# Patient Record
Sex: Male | Born: 1970 | Race: Asian | Hispanic: No | Marital: Single | Smoking: Current every day smoker
Health system: Southern US, Community
[De-identification: ages and names within clinical notes are randomized; demographics above are authoritative.]

## PROBLEM LIST (undated history)

## (undated) DIAGNOSIS — C189 Malignant neoplasm of colon, unspecified: Secondary | ICD-10-CM

## (undated) HISTORY — DX: Malignant neoplasm of colon, unspecified: C18.9

## (undated) HISTORY — PX: COLONOSCOPY: SHX174

---

## 2015-03-11 ENCOUNTER — Encounter (HOSPITAL_COMMUNITY): Payer: Self-pay | Admitting: Nurse Practitioner

## 2015-03-11 ENCOUNTER — Ambulatory Visit (INDEPENDENT_AMBULATORY_CARE_PROVIDER_SITE_OTHER): Payer: Self-pay | Admitting: Family Medicine

## 2015-03-11 ENCOUNTER — Emergency Department (HOSPITAL_COMMUNITY)
Admission: EM | Admit: 2015-03-11 | Discharge: 2015-03-12 | Disposition: A | Discharge status: Home / Self Care | Payer: Self-pay | Attending: Emergency Medicine | Admitting: Emergency Medicine

## 2015-03-11 ENCOUNTER — Emergency Department (HOSPITAL_COMMUNITY): Payer: Self-pay

## 2015-03-11 ENCOUNTER — Ambulatory Visit (INDEPENDENT_AMBULATORY_CARE_PROVIDER_SITE_OTHER): Payer: Self-pay

## 2015-03-11 VITALS — BP 138/90 | HR 74 | Temp 99.5°F | Resp 18 | Ht 66.0 in | Wt 131.0 lb

## 2015-03-11 DIAGNOSIS — R110 Nausea: Secondary | ICD-10-CM | POA: Insufficient documentation

## 2015-03-11 DIAGNOSIS — K529 Noninfective gastroenteritis and colitis, unspecified: Secondary | ICD-10-CM | POA: Insufficient documentation

## 2015-03-11 DIAGNOSIS — R1084 Generalized abdominal pain: Secondary | ICD-10-CM

## 2015-03-11 DIAGNOSIS — D72829 Elevated white blood cell count, unspecified: Secondary | ICD-10-CM

## 2015-03-11 DIAGNOSIS — R1030 Lower abdominal pain, unspecified: Secondary | ICD-10-CM

## 2015-03-11 DIAGNOSIS — Z720 Tobacco use: Secondary | ICD-10-CM | POA: Insufficient documentation

## 2015-03-11 LAB — COMPREHENSIVE METABOLIC PANEL
ALT: 18 U/L (ref 17–63)
ANION GAP: 10 (ref 5–15)
AST: 20 U/L (ref 15–41)
Albumin: 4.6 g/dL (ref 3.5–5.0)
Alkaline Phosphatase: 80 U/L (ref 38–126)
BUN: 8 mg/dL (ref 6–20)
CHLORIDE: 104 mmol/L (ref 101–111)
CO2: 24 mmol/L (ref 22–32)
CREATININE: 0.6 mg/dL — AB (ref 0.61–1.24)
Calcium: 9.6 mg/dL (ref 8.9–10.3)
Glucose, Bld: 112 mg/dL — ABNORMAL HIGH (ref 65–99)
POTASSIUM: 4 mmol/L (ref 3.5–5.1)
SODIUM: 138 mmol/L (ref 135–145)
Total Bilirubin: 0.6 mg/dL (ref 0.3–1.2)
Total Protein: 8.1 g/dL (ref 6.5–8.1)

## 2015-03-11 LAB — CBC
HEMATOCRIT: 47.6 % (ref 39.0–52.0)
HEMOGLOBIN: 16.3 g/dL (ref 13.0–17.0)
MCH: 30.6 pg (ref 26.0–34.0)
MCHC: 34.2 g/dL (ref 30.0–36.0)
MCV: 89.5 fL (ref 78.0–100.0)
Platelets: 253 10*3/uL (ref 150–400)
RBC: 5.32 MIL/uL (ref 4.22–5.81)
RDW: 13.3 % (ref 11.5–15.5)
WBC: 16.7 10*3/uL — AB (ref 4.0–10.5)

## 2015-03-11 LAB — POCT URINALYSIS DIP (MANUAL ENTRY)
Bilirubin, UA: NEGATIVE
Blood, UA: NEGATIVE
Glucose, UA: NEGATIVE
Leukocytes, UA: NEGATIVE
Nitrite, UA: NEGATIVE
Protein Ur, POC: NEGATIVE
Spec Grav, UA: >=1.03
Urobilinogen, UA: 0.2
pH, UA: 5

## 2015-03-11 LAB — LIPASE, BLOOD: LIPASE: 21 U/L — AB (ref 22–51)

## 2015-03-11 LAB — I-STAT CHEM 8, ED
BUN: 7 mg/dL (ref 6–20)
Calcium, Ion: 1.13 mmol/L (ref 1.12–1.23)
Chloride: 104 mmol/L (ref 101–111)
Creatinine, Ser: 0.7 mg/dL (ref 0.61–1.24)
Glucose, Bld: 112 mg/dL — ABNORMAL HIGH (ref 65–99)
HCT: 53 % — ABNORMAL HIGH (ref 39.0–52.0)
HEMOGLOBIN: 18 g/dL — AB (ref 13.0–17.0)
Potassium: 3.8 mmol/L (ref 3.5–5.1)
Sodium: 141 mmol/L (ref 135–145)
TCO2: 23 mmol/L (ref 0–100)

## 2015-03-11 LAB — POC MICROSCOPIC URINALYSIS (UMFC)

## 2015-03-11 LAB — POCT CBC
Granulocyte percent: 85.2 %G — AB (ref 37–80)
HCT, POC: 48.8 % (ref 43.5–53.7)
Hemoglobin: 15.1 g/dL (ref 14.1–18.1)
Lymph, poc: 2 (ref 0.6–3.4)
MCH, POC: 27.5 pg (ref 27–31.2)
MCHC: 31 g/dL — AB (ref 31.8–35.4)
MCV: 88.5 fL (ref 80–97)
MID (cbc): 0.7 (ref 0–0.9)
MPV: 8.2 fL (ref 0–99.8)
POC Granulocyte: 15.2 — AB (ref 2–6.9)
POC LYMPH PERCENT: 11.1 %L (ref 10–50)
POC MID %: 3.7 % (ref 0–12)
Platelet Count, POC: 254 10*3/uL (ref 142–424)
RBC: 5.51 M/uL (ref 4.69–6.13)
RDW, POC: 13.9 %
WBC: 17.8 10*3/uL — AB (ref 4.6–10.2)

## 2015-03-11 MED ORDER — CIPROFLOXACIN HCL 500 MG PO TABS
500.0000 mg | ORAL_TABLET | Freq: Once | ORAL | Status: AC
  Administered 2015-03-12: 500 mg via ORAL
  Filled 2015-03-11: qty 1

## 2015-03-11 MED ORDER — ACETAMINOPHEN 500 MG PO TABS
1000.0000 mg | ORAL_TABLET | Freq: Once | ORAL | Status: AC
  Administered 2015-03-11: 1000 mg via ORAL
  Filled 2015-03-11: qty 2

## 2015-03-11 MED ORDER — METRONIDAZOLE 500 MG PO TABS
500.0000 mg | ORAL_TABLET | Freq: Once | ORAL | Status: AC
  Administered 2015-03-12: 500 mg via ORAL
  Filled 2015-03-11: qty 1

## 2015-03-11 MED ORDER — SODIUM CHLORIDE 0.9 % IV BOLUS (SEPSIS)
1000.0000 mL | Freq: Once | INTRAVENOUS | Status: AC
  Administered 2015-03-11: 1000 mL via INTRAVENOUS

## 2015-03-11 MED ORDER — IOHEXOL 300 MG/ML  SOLN
100.0000 mL | Freq: Once | INTRAMUSCULAR | Status: AC | PRN
  Administered 2015-03-11: 100 mL via INTRAVENOUS

## 2015-03-11 MED ORDER — IOHEXOL 300 MG/ML  SOLN
25.0000 mL | Freq: Once | INTRAMUSCULAR | Status: AC | PRN
  Administered 2015-03-11: 25 mL via ORAL

## 2015-03-11 MED ORDER — MORPHINE SULFATE (PF) 4 MG/ML IV SOLN
4.0000 mg | Freq: Once | INTRAVENOUS | Status: AC
  Administered 2015-03-11: 4 mg via INTRAVENOUS
  Filled 2015-03-11: qty 1

## 2015-03-11 MED ORDER — ONDANSETRON HCL 4 MG/2ML IJ SOLN
4.0000 mg | Freq: Once | INTRAMUSCULAR | Status: AC
  Administered 2015-03-11: 4 mg via INTRAVENOUS
  Filled 2015-03-11: qty 2

## 2015-03-11 NOTE — ED Notes (Signed)
Pt is non english speaking, family at bedside report that they sent from urgent care to get a abdominal CT scan to rule out appendicitis. Currently RLQ pain rating  10/1, reports nausea without vomiting.

## 2015-03-11 NOTE — ED Notes (Signed)
Patient transported to CT 

## 2015-03-11 NOTE — Patient Instructions (Signed)
?  au b?ng °(Abdominal Pain) °Có nhi?u nguyên nhân d?n ??n ?au b?ng. Thông th??ng ?au b?ng là do m?t b?nh gây ra và s? không ?? n?u không ?i?u tr?. B?nh này có th? ???c theo dõi và ?i?u tr? t?i nhà. Chuyên gia ch?m sóc s?c kh?e s? ti?n hành khám th?c th? và có th? yêu c?u làm xét nghi?m máu và ch?p X quang ?? xác ??nh m?c ?? nghiêm tr?ng c?a c?n ?au b?ng. Tuy nhiên, trong nhi?u tr??ng h?p, ph?i m?t nhi?u th?i gian h?n ?? xác ??nh rõ nguyên nhân gây ?au b?ng. Tr??c khi tìm ra nguyên nhân, chuyên gia ch?m sóc s?c kh?e có th? không bi?t li?u quý v? có c?n làm thêm xét nghi?m ho?c ti?p t?c ?i?u tr? hay không. °H??NG D?N CH?M SÓC T?I NHÀ  °Theo dõi c?n ?au b?ng xem có b?t k? thay ??i nào không. Nh?ng hành ??ng sau có th? giúp lo?i b? b?t c? c?m giác khó ch?u nào quý v? ?ang b?. °· Ch? s? d?ng thu?c không c?n kê ??n ho?c thu?c c?n kê ??n theo ch? d?n c?a chuyên gia ch?m sóc s?c kh?e. °· Không dùng thu?c nhu?n tràng tr? khi ???c chuyên gia ch?m sóc s?c kh?e ch? ??nh. °· Th? dùng m?t ch? ?? ?n l?ng (n??c lu?c th?t, trà, ho?c n??c) theo ch? d?n c?a chuyên gia ch?m sóc s?c kh?e. Chuy?n d?n sang m?t ch? ?? ?n nh? n?u ch?u ???c. °?I KHÁM N?U: °· Quý v? b? ?au b?ng không rõ nguyên nhân. °· Quý v? b? ?au b?ng kèm theo bu?n nôn ho?c tiêu ch?y. °· Quý v? b? ?au khi ?i ti?u ho?c ?i ngoài. °· Quý v? b? c?n ?au b?ng làm th?c gi?c vào ban ?êm. °· Quý v? b? ?au b?ng n?ng thêm ho?c ?? h?n khi ?n. °· Quý v? b? ?au b?ng n?ng thêm khi ?n ?? ?n nhi?u ch?t béo. °· Quý v? b? s?t. °NGAY L?P T?C ?I KHÁM N?U:  °· C?n ?au không kh?i trong vòng 2 gi?. °· Quý v? v?n ti?p t?c b? ói (nôn m?a). °· Ch? có th? c?m th?y ?au ? m?t s? ph?n b?ng, ch?ng h?n nh? ? ph?n b?ng bên ph?i ho?c bên d??i trái. °· Phân c?a quý v? có máu ho?c có màu ?en nh? h?c ín. °??M B?O QUÝ V?: °· Hi?u rõ các h??ng d?n này. °· S? theo dõi tình tr?ng c?a mình. °· S? yêu c?u tr? giúp ngay l?p t?c n?u quý v? c?m th?y không kh?e ho?c th?y tr?m tr?ng h?n. °Document Released: 06/07/2005  Document Revised: 06/12/2013 °ExitCare® Patient Information ©2015 [employer]. This information is not intended to replace advice given to you by your health care provider. Make sure you discuss any questions you have with your health care provider. ° °

## 2015-03-11 NOTE — Progress Notes (Signed)
Chief Complaint:  Chief Complaint  Patient presents with  . Abdominal Pain    x 2 days denies vomiting, constipation or diarrhea  . Nausea    HPI: John Moon is a 44 y.o. male who reports to [facility] today complaining of 2 day history of abdominal pain, he thinks it may be food associated but not sure.  He ate shrimp noodles, which were not spicy. He has no food allergies. He has not really eaten. He has left sided abd pain then radiates to his umbilical area.  He has had no abd surgeries. He still has a little bit of pain whne he sits but when he moves it is worse. He has the worse pain after he eats. He has tried a laxative without relief. No diarrhea sxs, no rashes.  He has not had a bowel movement in 2 days, he has been able to pass gas. No fevers or chills, he has good appetite. He has not eaten due to pain.  He denies alcohol use, he denies kidney stones, he denies high cholesterol  Or urinary sxs  No past medical history on file. No past surgical history on file. Social History   Social History  . Marital Status: Single    Spouse Name: N/A  . Number of Children: N/A  . Years of Education: N/A   Social History Main Topics  . Smoking status: Current Every Day Smoker -- 1.00 packs/day    Types: Cigarettes  . Smokeless tobacco: None  . Alcohol Use: 1.2 oz/week    2 Standard drinks or equivalent per week  . Drug Use: None  . Sexual Activity: Not Asked   Other Topics Concern  . None   Social History Narrative  . None   No family history on file. No Known Allergies Prior to Admission medications   Not on File     ROS: The patient denies fevers, chills, night sweats, unintentional weight loss, chest pain, palpitations, wheezing, dyspnea on exertion, vomiting,dysuria, hematuria, melena, numbness, weakness, or tingling.  All other systems have been reviewed and were otherwise negative with the exception of those mentioned in the HPI and as above.     PHYSICAL EXAM: Filed Vitals:   03/11/15 1812  BP: 138/90  Pulse: 74  Temp: 99.5 F (37.5 C)  Resp: 18   Body mass index is 21.15 kg/(m^2).   General: Alert, mild acute distress HEENT:  Normocephalic, atraumatic, oropharynx patent. EOMI, PERRLA Cardiovascular:  Regular rate and rhythm, no rubs murmurs or gallops.  No Carotid bruits, radial pulse intact. No pedal edema.  Respiratory: Clear to auscultation bilaterally.  No wheezes, rales, or rhonchi.  No cyanosis, no use of accessory musculature Abdominal: No organomegaly, abdomen is soft and + tender, + guarding positive bowel sounds. No masses. Skin: No rashes. Neurologic: Facial musculature symmetric. Psychiatric: Patient acts appropriately throughout our interaction. Lymphatic: No cervical or submandibular lymphadenopathy Musculoskeletal: Gait intact. No edema, tenderness   LABS: Results for orders placed or performed in visit on 03/11/15  POCT CBC  Result Value Ref Range   WBC 17.8 (A) 4.6 - 10.2 K/uL   Lymph, poc 2.0 0.6 - 3.4   POC LYMPH PERCENT 11.1 10 - 50 %L   MID (cbc) 0.7 0 - 0.9   POC MID % 3.7 0 - 12 %M   POC Granulocyte 15.2 (A) 2 - 6.9   Granulocyte percent 85.2 (A) 37 - 80 %G   RBC 5.51 4.69 -  6.13 M/uL   Hemoglobin 15.1 14.1 - 18.1 g/dL   HCT, POC 48.8 43.5 - 53.7 %   MCV 88.5 80 - 97 fL   MCH, POC 27.5 27 - 31.2 pg   MCHC 31.0 (A) 31.8 - 35.4 g/dL   RDW, POC 13.9 %   Platelet Count, POC 254 142 - 424 K/uL   MPV 8.2 0 - 99.8 fL  POCT urinalysis dipstick  Result Value Ref Range   Color, UA yellow yellow   Clarity, UA clear clear   Glucose, UA negative negative   Bilirubin, UA negative negative   Ketones, POC UA small (15) (A) negative   Spec Grav, UA >=1.030    Blood, UA negative negative   pH, UA 5.0    Protein Ur, POC negative negative   Urobilinogen, UA 0.2    Nitrite, UA Negative Negative   Leukocytes, UA Negative Negative  POCT Microscopic Urinalysis ([facility])  Result Value Ref Range    WBC,UR,HPF,POC None None WBC/hpf   RBC,UR,HPF,POC None None RBC/hpf   Bacteria None None   Mucus Present (A) Absent   Epithelial Cells, UR Per Microscopy None None cells/hpf     EKG/XRAY:   Primary read interpreted by Dr. Marin Comment at [facility]. No acute abdomen, no obstruction, free air   ASSESSMENT/PLAN: Encounter Diagnoses  Name Primary?  . Generalized abdominal pain Yes  . Nausea without vomiting   . Lower abdominal pain   . Leukocytosis    Pleasant 44 y/o male with diffuse abd pain in lower abdomen more on left than right , nausea and leukocytosis Rule out appendicitis, diverticulitis, acute cholcystitis Advise to go to [facility] ER for further evaluation Fu prn   Gross sideeffects, risk and benefits, and alternatives of medications d/w patient. Patient is aware that all medications have potential sideeffects and we are unable to predict every sideeffect or drug-drug interaction that may occur.  Thao Le DO  03/11/2015 8:08 PM

## 2015-03-11 NOTE — ED Provider Notes (Signed)
CSN: [id number]     Arrival date & time 03/11/15  2057 History   First MD Initiated Contact with Patient 03/11/15 2153     Chief Complaint  Patient presents with  . Abdominal Pain  . Sent from Urgent Care for CT      (Consider location/radiation/quality/duration/timing/severity/associated sxs/prior Treatment) The history is provided by the patient.  John Moon is a 44 y.o. male here presenting with periumbilical, right lower quadrant pain. States that he has periumbilical pain since yesterday. Also has some nausea and subjective fever. Denies any vomiting. Patient went to urgent care and had a white count of 17,000 and sent here for CT abdomen and pelvis to rule out appendicitis. UA at the urgent care is unremarkable.     History reviewed. No pertinent past medical history. History reviewed. No pertinent past surgical history. History reviewed. No pertinent family history. Social History  Substance Use Topics  . Smoking status: Current Every Day Smoker -- 1.00 packs/day    Types: Cigarettes  . Smokeless tobacco: None  . Alcohol Use: 1.2 oz/week    2 Standard drinks or equivalent per week    Review of Systems  Gastrointestinal: Positive for nausea and abdominal pain.  All other systems reviewed and are negative.     Allergies  Review of patient's allergies indicates no known allergies.  Home Medications   Prior to Admission medications   Not on File   BP 124/78 mmHg  Pulse 67  Temp(Src) 98.3 F (36.8 C) (Oral)  Resp 16  SpO2 98% Physical Exam  Constitutional: He is oriented to person, place, and time. He appears well-developed.  Uncomfortable   HENT:  Head: Normocephalic.  Mouth/Throat: Oropharynx is clear and moist.  Eyes: Conjunctivae are normal. Pupils are equal, round, and reactive to light.  Neck: Normal range of motion. Neck supple.  Cardiovascular: Normal rate and normal heart sounds.   Pulmonary/Chest: Effort normal and breath sounds normal. No  respiratory distress. He has no wheezes. He has no rales.  Abdominal: Soft. Bowel sounds are normal.  + periumbilical tenderness, mild RLQ tenderness, no rebound   Musculoskeletal: Normal range of motion. He exhibits no edema or tenderness.  Neurological: He is alert and oriented to person, place, and time. No cranial nerve deficit. Coordination normal.  Skin: Skin is warm and dry.  Psychiatric: He has a normal mood and affect. His behavior is normal. Judgment and thought content normal.  Nursing note and vitals reviewed.   ED Course  Procedures (including critical care time) Labs Review Labs Reviewed  LIPASE, BLOOD - Abnormal; Notable for the following:    Lipase 21 (*)    All other components within normal limits  COMPREHENSIVE METABOLIC PANEL - Abnormal; Notable for the following:    Glucose, Bld 112 (*)    Creatinine, Ser 0.60 (*)    All other components within normal limits  CBC - Abnormal; Notable for the following:    WBC 16.7 (*)    All other components within normal limits  I-STAT CHEM 8, ED - Abnormal; Notable for the following:    Glucose, Bld 112 (*)    Hemoglobin 18.0 (*)    HCT 53.0 (*)    All other components within normal limits  URINALYSIS, ROUTINE W REFLEX MICROSCOPIC (NOT AT [facility])    Imaging Review Ct Abdomen Pelvis W Contrast  03/11/2015   CLINICAL DATA:  Patient's family reports that they were sent from urgent care to get a abdominal CT scan to  rule out appendicitis. Currently RLQ pain, reports nausea without vomiting  EXAM: CT ABDOMEN AND PELVIS WITH CONTRAST  TECHNIQUE: Multidetector CT imaging of the abdomen and pelvis was performed using the standard protocol following bolus administration of intravenous contrast.  CONTRAST:  74mL OMNIPAQUE IOHEXOL 300 MG/ML SOLN, 170mL OMNIPAQUE IOHEXOL 300 MG/ML SOLN  COMPARISON:  03/11/2015  FINDINGS: Lower chest: No pulmonary nodules, pleural effusions, or infiltrates. Heart size is normal. No imaged pericardial effusion  or significant coronary artery calcifications.  Upper abdomen: No focal abnormality identified within the liver, spleen, pancreas, or adrenal glands. The kidneys are symmetric and enhancement and excretion. No hydronephrosis. Gallbladder is present.  Gastrointestinal tract: The stomach and small bowel loops have a normal appearance. The appendix is well seen and has a normal appearance.  There is significant thickening of the wall of the transverse colon which measures up to 1.4 cm. Although there is significant stool burden there is no evidence for obstructing lesion.  Pelvis: The urinary bladder is decompressed. The prostate gland appears mildly prominent. There is a small amount of fluid within the pelvis.  Retroperitoneum: Mild atherosclerotic calcification within the abdominal aorta. No aneurysm. Contrast is identified within the celiac, superior mesenteric artery, and inferior mesenteric artery. No retroperitoneal or mesenteric adenopathy.  Abdominal wall: Unremarkable.  Osseous structures: Unremarkable.  IMPRESSION: 1. Marked abnormality of the transverse colon. There is significant thickening of the wall of the transverse colon not associated with obstruction or discrete mass. Findings are most consistent with colitis. Considerations include infectious or inflammatory causes. Ischemic colitis would be much less likely. 2. Mild prominence of the prostate gland. 3. Normal appendix. 4. Abdominal aortic atherosclerosis.   Electronically Signed   By: Nolon Nations M.D.   On: 03/11/2015 23:46   Dg Abd Acute W/chest  03/11/2015   CLINICAL DATA:  Abdominal pain.  EXAM: DG ABDOMEN ACUTE W/ 1V CHEST  COMPARISON:  None.  FINDINGS: There is no evidence of dilated bowel loops or free intraperitoneal air. No radiopaque calculi or other significant radiographic abnormality is seen. Heart size and mediastinal contours are within normal limits. Both lungs are clear.  IMPRESSION: Negative abdominal radiographs.  No  acute cardiopulmonary disease.   Electronically Signed   By: Nolon Nations M.D.   On: 03/11/2015 20:45   I have personally reviewed and evaluated these images and lab results as part of my medical decision-making.   EKG Interpretation None      MDM   Final diagnoses:  None    John Moon is a 44 y.o. male here with periumbilical pain, RLQ pain, leukocytosis. Concerned for possible appendicitis. Will repeat labs, get CT ab/pel.   12:00 AM WBC 16. CT showed nl appendix. Has colitis uncomplicated. Given cipro/flagyl. Pain controlled. Will dc home.     Wandra Arthurs, MD 03/12/15 0000

## 2015-03-12 LAB — COMPLETE METABOLIC PANEL WITHOUT GFR
ALT: 17 U/L (ref 9–46)
Albumin: 4.8 g/dL (ref 3.6–5.1)
Alkaline Phosphatase: 91 U/L (ref 40–115)
Calcium: 9.5 mg/dL (ref 8.6–10.3)
Creat: 0.65 mg/dL (ref 0.60–1.35)
GFR, Est African American: >89 mL/min (ref >=60)
GFR, Est Non African American: >89 mL/min (ref >=60)
Total Bilirubin: 0.4 mg/dL (ref 0.2–1.2)
Total Protein: 7.4 g/dL (ref 6.1–8.1)

## 2015-03-12 LAB — COMPLETE METABOLIC PANEL WITH GFR
AST: 12 U/L (ref 10–40)
BUN: 8 mg/dL (ref 7–25)
CO2: 23 mmol/L (ref 20–31)
Chloride: 103 mmol/L (ref 98–110)
Glucose, Bld: 99 mg/dL (ref 65–99)
Potassium: 4 mmol/L (ref 3.5–5.3)
Sodium: 137 mmol/L (ref 135–146)

## 2015-03-12 MED ORDER — METRONIDAZOLE 500 MG PO TABS: 500.0000 mg | ORAL_TABLET | Freq: Two times a day (BID) | ORAL | Status: DC

## 2015-03-12 MED ORDER — CIPROFLOXACIN HCL 500 MG PO TABS: 500.0000 mg | ORAL_TABLET | Freq: Two times a day (BID) | ORAL | Status: DC

## 2015-03-12 NOTE — Discharge Instructions (Signed)
Take cipro and flagyl for 10 days.  Take tylenol or motrin for pain and fever.  Stay hydrated.  See your doctor.   Return to ER if you have severe pain, vomiting, fever for a week.

## 2015-03-12 NOTE — ED Notes (Signed)
Patient was alert, oriented and stable upon discharge. RN went over AVS and patient had no further questions.  

## 2015-03-31 ENCOUNTER — Encounter: Payer: Self-pay | Admitting: Family Medicine

## 2015-06-09 ENCOUNTER — Encounter (HOSPITAL_COMMUNITY): Payer: Self-pay

## 2015-06-09 ENCOUNTER — Emergency Department (HOSPITAL_COMMUNITY): Payer: 59

## 2015-06-09 ENCOUNTER — Inpatient Hospital Stay (HOSPITAL_COMMUNITY)
Admission: EM | Admit: 2015-06-09 | Discharge: 2015-06-17 | DRG: 331 | Disposition: A | Discharge status: Home / Self Care | Payer: 59 | Attending: Internal Medicine | Admitting: Internal Medicine

## 2015-06-09 DIAGNOSIS — T17998A Other foreign object in respiratory tract, part unspecified causing other injury, initial encounter: Secondary | ICD-10-CM | POA: Diagnosis not present

## 2015-06-09 DIAGNOSIS — C182 Malignant neoplasm of ascending colon: Secondary | ICD-10-CM

## 2015-06-09 DIAGNOSIS — K529 Noninfective gastroenteritis and colitis, unspecified: Secondary | ICD-10-CM | POA: Diagnosis present

## 2015-06-09 DIAGNOSIS — K3533 Acute appendicitis with perforation and localized peritonitis, with abscess: Secondary | ICD-10-CM

## 2015-06-09 DIAGNOSIS — C189 Malignant neoplasm of colon, unspecified: Secondary | ICD-10-CM | POA: Diagnosis not present

## 2015-06-09 DIAGNOSIS — D126 Benign neoplasm of colon, unspecified: Secondary | ICD-10-CM | POA: Insufficient documentation

## 2015-06-09 DIAGNOSIS — C184 Malignant neoplasm of transverse colon: Secondary | ICD-10-CM | POA: Diagnosis not present

## 2015-06-09 DIAGNOSIS — R1013 Epigastric pain: Secondary | ICD-10-CM | POA: Diagnosis not present

## 2015-06-09 DIAGNOSIS — K6389 Other specified diseases of intestine: Secondary | ICD-10-CM | POA: Diagnosis not present

## 2015-06-09 DIAGNOSIS — I10 Essential (primary) hypertension: Secondary | ICD-10-CM | POA: Diagnosis present

## 2015-06-09 DIAGNOSIS — D123 Benign neoplasm of transverse colon: Secondary | ICD-10-CM | POA: Diagnosis not present

## 2015-06-09 DIAGNOSIS — F17210 Nicotine dependence, cigarettes, uncomplicated: Secondary | ICD-10-CM | POA: Diagnosis present

## 2015-06-09 DIAGNOSIS — T17908A Unspecified foreign body in respiratory tract, part unspecified causing other injury, initial encounter: Secondary | ICD-10-CM | POA: Insufficient documentation

## 2015-06-09 DIAGNOSIS — D124 Benign neoplasm of descending colon: Secondary | ICD-10-CM | POA: Diagnosis present

## 2015-06-09 DIAGNOSIS — K648 Other hemorrhoids: Secondary | ICD-10-CM | POA: Diagnosis present

## 2015-06-09 DIAGNOSIS — B20 Human immunodeficiency virus [HIV] disease: Secondary | ICD-10-CM | POA: Insufficient documentation

## 2015-06-09 DIAGNOSIS — R1031 Right lower quadrant pain: Secondary | ICD-10-CM | POA: Diagnosis present

## 2015-06-09 LAB — COMPREHENSIVE METABOLIC PANEL
ALK PHOS: 76 U/L (ref 38–126)
ALT: 17 U/L (ref 17–63)
ANION GAP: 12 (ref 5–15)
AST: 18 U/L (ref 15–41)
Albumin: 4.4 g/dL (ref 3.5–5.0)
BUN: 11 mg/dL (ref 6–20)
CALCIUM: 9.1 mg/dL (ref 8.9–10.3)
CO2: 23 mmol/L (ref 22–32)
CREATININE: 0.66 mg/dL (ref 0.61–1.24)
Chloride: 104 mmol/L (ref 101–111)
Glucose, Bld: 131 mg/dL — ABNORMAL HIGH (ref 65–99)
Potassium: 4 mmol/L (ref 3.5–5.1)
SODIUM: 139 mmol/L (ref 135–145)
TOTAL PROTEIN: 7.5 g/dL (ref 6.5–8.1)
Total Bilirubin: 0.4 mg/dL (ref 0.3–1.2)

## 2015-06-09 LAB — URINALYSIS, ROUTINE W REFLEX MICROSCOPIC
BILIRUBIN URINE: NEGATIVE
Glucose, UA: NEGATIVE mg/dL
Hgb urine dipstick: NEGATIVE
KETONES UR: NEGATIVE mg/dL
LEUKOCYTES UA: NEGATIVE
NITRITE: NEGATIVE
PROTEIN: NEGATIVE mg/dL
Specific Gravity, Urine: >1.046 — ABNORMAL HIGH (ref 1.005–1.030)
pH: 7 (ref 5.0–8.0)

## 2015-06-09 LAB — CBC
HCT: 45.2 % (ref 39.0–52.0)
HEMOGLOBIN: 15.2 g/dL (ref 13.0–17.0)
MCH: 31 pg (ref 26.0–34.0)
MCHC: 33.6 g/dL (ref 30.0–36.0)
MCV: 92.2 fL (ref 78.0–100.0)
PLATELETS: 227 10*3/uL (ref 150–400)
RBC: 4.9 MIL/uL (ref 4.22–5.81)
RDW: 12.9 % (ref 11.5–15.5)
WBC: 12.6 10*3/uL — AB (ref 4.0–10.5)

## 2015-06-09 LAB — I-STAT CG4 LACTIC ACID, ED: LACTIC ACID, VENOUS: 2.27 mmol/L — AB (ref 0.5–2.0)

## 2015-06-09 LAB — LIPASE, BLOOD: LIPASE: 156 U/L — AB (ref 11–51)

## 2015-06-09 MED ORDER — GUAIFENESIN-DM 100-10 MG/5ML PO SYRP: 5.0000 mL | ORAL_SOLUTION | ORAL | Status: DC | PRN

## 2015-06-09 MED ORDER — ONDANSETRON HCL 4 MG/2ML IJ SOLN
4.0000 mg | Freq: Once | INTRAMUSCULAR | Status: AC
  Administered 2015-06-09: 4 mg via INTRAVENOUS
  Filled 2015-06-09: qty 2

## 2015-06-09 MED ORDER — ONDANSETRON HCL 4 MG PO TABS: 4.0000 mg | ORAL_TABLET | Freq: Four times a day (QID) | ORAL | Status: DC | PRN

## 2015-06-09 MED ORDER — IOHEXOL 300 MG/ML  SOLN
50.0000 mL | Freq: Once | INTRAMUSCULAR | Status: AC | PRN
  Administered 2015-06-09: 50 mL via ORAL

## 2015-06-09 MED ORDER — ONDANSETRON HCL 4 MG/2ML IJ SOLN
4.0000 mg | Freq: Four times a day (QID) | INTRAMUSCULAR | Status: DC
  Administered 2015-06-10 – 2015-06-17 (×25): 4 mg via INTRAVENOUS
  Filled 2015-06-09 (×28): qty 2

## 2015-06-09 MED ORDER — CIPROFLOXACIN IN D5W 400 MG/200ML IV SOLN
400.0000 mg | Freq: Once | INTRAVENOUS | Status: AC
  Administered 2015-06-09: 400 mg via INTRAVENOUS
  Filled 2015-06-09: qty 200

## 2015-06-09 MED ORDER — SODIUM CHLORIDE 0.9 % IV BOLUS (SEPSIS)
1000.0000 mL | Freq: Once | INTRAVENOUS | Status: AC
  Administered 2015-06-09: 1000 mL via INTRAVENOUS

## 2015-06-09 MED ORDER — HYDROMORPHONE HCL 1 MG/ML IJ SOLN
1.0000 mg | Freq: Once | INTRAMUSCULAR | Status: AC
  Administered 2015-06-09: 1 mg via INTRAVENOUS
  Filled 2015-06-09: qty 1

## 2015-06-09 MED ORDER — ONDANSETRON HCL 4 MG PO TABS
4.0000 mg | ORAL_TABLET | Freq: Four times a day (QID) | ORAL | Status: DC
  Administered 2015-06-09 – 2015-06-10 (×3): 4 mg via ORAL
  Filled 2015-06-09 (×7): qty 1

## 2015-06-09 MED ORDER — SODIUM CHLORIDE 0.9 % IV SOLN: INTRAVENOUS | Status: DC

## 2015-06-09 MED ORDER — HYDROMORPHONE HCL 1 MG/ML IJ SOLN
1.0000 mg | Freq: Once | INTRAMUSCULAR | Status: AC
Start: 2015-06-09 — End: 2015-06-09
  Administered 2015-06-09: 1 mg via INTRAVENOUS
  Filled 2015-06-09: qty 1

## 2015-06-09 MED ORDER — HYDROCODONE-ACETAMINOPHEN 5-325 MG PO TABS
1.0000 | ORAL_TABLET | ORAL | Status: DC | PRN
  Administered 2015-06-09: 2 via ORAL
  Administered 2015-06-09: 1 via ORAL
  Administered 2015-06-10: 2 via ORAL
  Filled 2015-06-09 (×2): qty 2
  Filled 2015-06-09: qty 1

## 2015-06-09 MED ORDER — HEPARIN SODIUM (PORCINE) 5000 UNIT/ML IJ SOLN
5000.0000 [IU] | Freq: Three times a day (TID) | INTRAMUSCULAR | Status: DC
  Administered 2015-06-09 – 2015-06-11 (×4): 5000 [IU] via SUBCUTANEOUS
  Filled 2015-06-09 (×9): qty 1

## 2015-06-09 MED ORDER — IOHEXOL 300 MG/ML  SOLN
100.0000 mL | Freq: Once | INTRAMUSCULAR | Status: AC | PRN
  Administered 2015-06-09: 100 mL via INTRAVENOUS

## 2015-06-09 MED ORDER — METRONIDAZOLE 500 MG PO TABS
500.0000 mg | ORAL_TABLET | Freq: Three times a day (TID) | ORAL | Status: DC
  Administered 2015-06-09 – 2015-06-11 (×6): 500 mg via ORAL
  Filled 2015-06-09 (×10): qty 1

## 2015-06-09 MED ORDER — ONDANSETRON HCL 4 MG/2ML IJ SOLN: 4.0000 mg | Freq: Four times a day (QID) | INTRAMUSCULAR | Status: DC | PRN

## 2015-06-09 MED ORDER — PEG-KCL-NACL-NASULF-NA ASC-C 100 G PO SOLR
0.5000 | ORAL | Status: AC
  Administered 2015-06-09 – 2015-06-10 (×2): 100 g via ORAL
  Filled 2015-06-09 (×2): qty 1

## 2015-06-09 MED ORDER — HYDRALAZINE HCL 20 MG/ML IJ SOLN
10.0000 mg | Freq: Four times a day (QID) | INTRAMUSCULAR | Status: DC | PRN
  Administered 2015-06-12 – 2015-06-13 (×2): 10 mg via INTRAVENOUS
  Filled 2015-06-09 (×2): qty 1

## 2015-06-09 MED ORDER — SODIUM CHLORIDE 0.9 % IV SOLN
INTRAVENOUS | Status: AC
  Administered 2015-06-09 (×2): via INTRAVENOUS

## 2015-06-09 MED ORDER — METRONIDAZOLE IN NACL 5-0.79 MG/ML-% IV SOLN
500.0000 mg | Freq: Once | INTRAVENOUS | Status: AC
  Administered 2015-06-09: 500 mg via INTRAVENOUS
  Filled 2015-06-09: qty 100

## 2015-06-09 MED ORDER — AMLODIPINE BESYLATE 10 MG PO TABS
10.0000 mg | ORAL_TABLET | Freq: Every day | ORAL | Status: DC
  Administered 2015-06-09: 10 mg via ORAL
  Filled 2015-06-09 (×2): qty 1

## 2015-06-09 NOTE — ED Notes (Signed)
Patient made aware that urine sample is needed. Urinal at bedside. Patient encouraged to void when able.

## 2015-06-09 NOTE — ED Notes (Signed)
IV attempt unsuccessful Abbie, RN at bedside attempting IV insertion

## 2015-06-09 NOTE — Consult Note (Signed)
Referring Provider: [facility] Primary Care Physician:  No primary care provider on file. Primary Gastroenterologist:  unassigned  Reason for Consultation:  colitis     HPI: John Moon is a 44 y.o. male  who says that he was evaluated at an urgent care clinic several weeks ago and diagnosed with colitis. He was treated with several days of Cipro when initially felt better, but about a week ago again began having right lower quadrant abdominal pain with nausea, vomiting, diarrhea he presented to the emergency room this morning CT of the abdomen and pelvis showed proximal transverse colitis as well as question of aspiration. Blood work was unremarkable. Chart review shows the patient was in the emergency room in September with complaints of abdominal pains as well and at that time had a CT that again showed marked abnormality of the transverse colon with significant thickening of the wall of the transverse colon not associated with extraction a discrete mass. Findings were most consistent with colitis. He denies complaints of dysuria, fever, chills. He denies frequent use of nonsteroidal anti-inflammatories. No one else in the house has been sick. He has never had a colonoscopy. He is unaware of a family history of colon cancer, colon polyps, or inflammatory bowel disease.   History reviewed. No pertinent past medical history.  No past surgical history on file.  Prior to Admission medications   Medication Sig Start Date End Date Taking? Authorizing Provider  ciprofloxacin (CIPRO) 500 MG tablet Take 1 tablet (500 mg total) by mouth 2 (two) times daily. One po bid x 10 days Patient not taking: Reported on 06/09/2015 03/12/15   Wandra Arthurs, MD  metroNIDAZOLE (FLAGYL) 500 MG tablet Take 1 tablet (500 mg total) by mouth 2 (two) times daily. One po bid x 10 days Patient not taking: Reported on 06/09/2015 03/12/15   Wandra Arthurs, MD    Current Facility-Administered Medications  Medication  Dose Route Frequency Provider Last Rate Last Dose  . 0.9 %  sodium chloride infusion   Intravenous Continuous Thurnell Lose, MD      . amLODipine (NORVASC) tablet 10 mg  10 mg Oral Daily Thurnell Lose, MD      . guaiFENesin-dextromethorphan (ROBITUSSIN DM) 100-10 MG/5ML syrup 5 mL  5 mL Oral Q4H PRN Thurnell Lose, MD      . heparin injection 5,000 Units  5,000 Units Subcutaneous 3 times per day Thurnell Lose, MD      . hydrALAZINE (APRESOLINE) injection 10 mg  10 mg Intravenous Q6H PRN Thurnell Lose, MD      . HYDROcodone-acetaminophen (NORCO/VICODIN) 5-325 MG per tablet 1-2 tablet  1-2 tablet Oral Q4H PRN Thurnell Lose, MD      . metroNIDAZOLE (FLAGYL) tablet 500 mg  500 mg Oral 3 times per day Thurnell Lose, MD      . ondansetron (ZOFRAN) tablet 4 mg  4 mg Oral Q6H PRN Thurnell Lose, MD       Or  . ondansetron (ZOFRAN) injection 4 mg  4 mg Intravenous Q6H PRN Thurnell Lose, MD      . sodium chloride 0.9 % bolus 1,000 mL  1,000 mL Intravenous Once Thurnell Lose, MD 1,000 mL/hr at 06/09/15 0850 1,000 mL at 06/09/15 0850    Allergies as of 06/09/2015  . (No Known Allergies)    No family history on file.  Social History   Social History  . Marital Status: Single  Spouse Name: N/A  . Number of Children: N/A  . Years of Education: N/A   Occupational History  . Not on file.   Social History Main Topics  . Smoking status: Current Every Day Smoker -- 1.00 packs/day    Types: Cigarettes  . Smokeless tobacco: Not on file  . Alcohol Use: 1.2 oz/week    2 Standard drinks or equivalent per week  . Drug Use: Not on file  . Sexual Activity: Not on file   Other Topics Concern  . Not on file   Social History Narrative    Review of Systems: Gen: Denies any fever, chills, sweats, anorexia, fatigue, weakness, malaise, weight loss, and sleep disorder CV: Denies chest pain, angina, palpitations, syncope, orthopnea, PND, peripheral edema, and  claudication. Resp: Denies dyspnea at rest, dyspnea with exercise, cough, sputum, wheezing, coughing up blood, and pleurisy. GI: Denies vomiting blood, jaundice, and fecal incontinence.   Denies dysphagia or odynophagia. He has had nausea, vomiting and diarrhea GU : Denies urinary burning, blood in urine, urinary frequency, urinary hesitancy, nocturnal urination, and urinary incontinence. MS: Denies joint pain, limitation of movement, and swelling, stiffness, low back pain, extremity pain. Denies muscle weakness, cramps, atrophy.  Derm: Denies rash, itching, dry skin, hives, moles, warts, or unhealing ulcers.  Psych: Denies depression, anxiety, memory loss, suicidal ideation, hallucinations, paranoia, and confusion. Heme: Denies bruising, bleeding, and enlarged lymph nodes. Neuro:  Denies any headaches, dizziness, paresthesias. Endo:  Denies any problems with DM, thyroid, adrenal function.  Physical Exam: Vital signs in last 24 hours: Temp:  [98.6 F (37 C)-99.6 F (37.6 C)] 98.6 F (37 C) (12/19 0908) Pulse Rate:  [75-78] 75 (12/19 0908) Resp:  [20-21] 20 (12/19 0908) BP: (141-168)/(93-104) 157/99 mmHg (12/19 0908) SpO2:  [98 %-100 %] 99 % (12/19 0908) Weight:  [131 lb (59.421 kg)-131 lb 4.8 oz (59.557 kg)] 131 lb 4.8 oz (59.557 kg) (12/19 0908)   General:   Alert,  Well-developed, thin, pleasant Asian male in no apparent distress Head:  Normocephalic and atraumatic. Eyes:  Sclera clear, no icterus.   Conjunctiva pink. Ears:  Normal auditory acuity. Nose:  No deformity, discharge,  or lesions. Mouth:  No deformity or lesions.   Neck:  Supple; no masses or thyromegaly. Lungs:  Clear throughout to auscultation.   No wheezes, crackles, or rhonchi.  Heart:  Regular rate and rhythm; no murmurs, clicks, rubs,  or gallops. Abdomen:  Soft, mild right lower quadrant and suprapubic tenderness to palpation BS active,nonpalp mass or hsm.   Rectal: Digital rectal exam with mucousy brown stool  that tests Hemoccult positive Msk:  Symmetrical without gross deformities. . Pulses:  Normal pulses noted. Extremities:  Without clubbing or edema. Neurologic:  Alert and  oriented x4;  grossly normal neurologically. Skin:  Intact without significant lesions or rashes.. Psych:  Alert and cooperative. Normal mood and affect.   Lab Results:  Recent Labs  06/09/15 0229  WBC 12.6*  HGB 15.2  HCT 45.2  PLT 227   BMET  Recent Labs  06/09/15 0229  NA 139  K 4.0  CL 104  CO2 23  GLUCOSE 131*  BUN 11  CREATININE 0.66  CALCIUM 9.1   LFT  Recent Labs  06/09/15 0229  PROT 7.5  ALBUMIN 4.4  AST 18  ALT 17  ALKPHOS 76  BILITOT 0.4     Studies/Results: Ct Abdomen Pelvis W Contrast  06/09/2015  CLINICAL DATA:  Abdominal pain, leukocytosis, elevated lipase. EXAM: CT ABDOMEN AND  PELVIS WITH CONTRAST TECHNIQUE: Multidetector CT imaging of the abdomen and pelvis was performed using the standard protocol following bolus administration of intravenous contrast. CONTRAST:  128mL OMNIPAQUE IOHEXOL 300 MG/ML SOLN, 49mL OMNIPAQUE IOHEXOL 300 MG/ML SOLN COMPARISON:  03/11/2015 FINDINGS: Lower chest and abdominal wall:  No contributory findings. Hepatobiliary: No focal liver abnormality.No evidence of biliary obstruction or stone. Pancreas: Unremarkable. Spleen: Unremarkable. Adrenals/Urinary Tract: Negative adrenals. No hydronephrosis or stone. Mildly distended with mild circumferential bladder wall thickening, likely related to prostate enlargement. Reproductive:Symmetric enlargement of the prostate, stable. Stomach/Bowel: Gas dilated proximal and transverse colon with fluid level and patchy wall thickening, greatest along the proximal transverse segment where it is low-density submucosal edema. There is pericolonic fat edema, nodal enlargement, and small regional ascites. At the distal transverse segment there is caliber change with thick walled enhancing appearance, similar findings also  seen on the previous study. No high-grade obstruction or perforation. No acute appendicitis. No skip inflammation identified. Vascular/Lymphatic: No acute vascular abnormality. Peritoneal: Small ascites along the right para colic gutter. Musculoskeletal: No acute abnormalities. 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 No sacroiliitis. IMPRESSION: 1. Proximal and transverse colitis. Appearance is nonspecific but chronic or recurrent disease compared to September 2016 CT increases chances of autoimmune colitis. 2. Persistent focal collapse and thickening of the distal transverse colon, recommend colonoscopy to evaluate for mass lesion. Electronically Signed   By: Monte Fantasia M.D.   On: 06/09/2015 06:05    IMPRESSION/PLAN: 44 year old male presenting with abdominal pain and diarrhea and colitis on CT. He has recently had a course of Cipro for a colitis, therefore C. Difficile is a  concern. C. Difficile quick  scan as yet to be obtained. Recommend bowel rest, IV fluids, Flagyl. Will plan on colonoscopy tomorrow to evaluate for colitis/possible mass.The risks, benefits, and alternatives to colonoscopy with possible biopsy and possible polypectomy were discussed with the patient and they consent to proceed.   Hypertension Tobacco abuse   Hvozdovic, Vita Barley PA-C 06/09/2015,  Pager [phone]  Mon-Fri 8a-5p [phone] after 5p, weekends, holidays   Attending physician's note   I have taken a history, examined the patient and reviewed the chart. I agree with the Advanced Practitioner's note, impression and recommendations. 75 yr M with RLQ abdominal pain and evidence of colitis on CT. F/u C.Diff.  Will plan for colonoscopy with possible biopsies tomorrow. Differential includes inflammatory ve infectious; less likely to be ischemic colitis   K Denzil Magnuson, MD [phone] Mon-Fri 8a-5p [phone] after 5p, weekends, holidays

## 2015-06-09 NOTE — ED Notes (Signed)
Pt is aware that a urine specimen is needed.

## 2015-06-09 NOTE — ED Notes (Signed)
Pt states that he has had N/V X 5 for 3 hours. States that he was given cipro on 11/28 for an unknown abdominal problem and he has been taking them sporadically. Alert and oriented.

## 2015-06-09 NOTE — Care Management Note (Signed)
Case Management Note  Patient Details  Name: John Moon MRN: [id number] Date of Birth: [birth year]  Subjective/Objective:                 Poss. Gi infection after outpt treatment of same   Action/Plan:  Will follow for needs   Expected Discharge Date:   (unknown)               Expected Discharge Plan:  Home/Self Care  In-House Referral:     Discharge planning Services  CM Consult, NA  Post Acute Care Choice:  NA Choice offered to:  NA  DME Arranged:    DME Agency:     HH Arranged:    HH Agency:     Status of Service:  In process, will continue to follow  Medicare Important Message Given:    Date Medicare IM Given:    Medicare IM give by:    Date Additional Medicare IM Given:    Additional Medicare Important Message give by:     If discussed at [place] of Stay Meetings, dates discussed:    Additional Comments:  Leeroy Cha, RN 06/09/2015, 12:51 PM

## 2015-06-09 NOTE — ED Notes (Signed)
Bed: WA18 Expected date:  Expected time:  Means of arrival:  Comments: EMS 

## 2015-06-09 NOTE — ED Provider Notes (Signed)
CSN: [id number]     Arrival date & time 06/09/15  0157 History   First MD Initiated Contact with Patient 06/09/15 0209     Chief Complaint  Patient presents with  . Emesis     (Consider location/radiation/quality/duration/timing/severity/associated sxs/prior Treatment) HPI Comments: 44 year old male presents to the emergency department for further evaluation of abdominal pain. He reports periumbilical abdominal pain which is nonradiating. Pain constant since onset. Symptoms have been associated with nausea and emesis 5 for the past 3 hours. Emesis nonbloody. He denies diarrhea and fever. No urinary symptoms. No hx of abdominal surgeries. He does have a hx of colitis and was prescribed ciprofloxacin on 05/19/15, but states that he took this sporadically because he had a "hard time remembering".  Patient is a 44 y.o. male presenting with vomiting. The history is provided by the patient. No language interpreter was used.  Emesis Associated symptoms: abdominal pain   Associated symptoms: no diarrhea     History reviewed. No pertinent past medical history. No past surgical history on file. No family history on file. Social History  Substance Use Topics  . Smoking status: Current Every Day Smoker -- 1.00 packs/day    Types: Cigarettes  . Smokeless tobacco: None  . Alcohol Use: 1.2 oz/week    2 Standard drinks or equivalent per week    Review of Systems  Constitutional: Negative for fever.  Gastrointestinal: Positive for nausea, vomiting (x3) and abdominal pain. Negative for diarrhea.  Genitourinary: Negative for dysuria.  All other systems reviewed and are negative.   Allergies  Review of patient's allergies indicates no known allergies.  Home Medications   Prior to Admission medications   Medication Sig Start Date End Date Taking? Authorizing Provider  ciprofloxacin (CIPRO) 500 MG tablet Take 1 tablet (500 mg total) by mouth 2 (two) times daily. One po bid x 10 days Patient  not taking: Reported on 06/09/2015 03/12/15   Wandra Arthurs, MD  metroNIDAZOLE (FLAGYL) 500 MG tablet Take 1 tablet (500 mg total) by mouth 2 (two) times daily. One po bid x 10 days Patient not taking: Reported on 06/09/2015 03/12/15   Wandra Arthurs, MD   BP 141/93 mmHg  Pulse 78  Temp(Src) 98.7 F (37.1 C) (Oral)  Resp 20  Ht 5\' 8"  (1.727 m)  Wt 59.421 kg  BMI 19.92 kg/m2  SpO2 98%   Physical Exam  Constitutional: He is oriented to person, place, and time. He appears well-developed and well-nourished. No distress.  Nontoxic/nonseptic appearing  HENT:  Head: Normocephalic and atraumatic.  Eyes: Conjunctivae and EOM are normal. No scleral icterus.  Neck: Normal range of motion.  Cardiovascular: Normal rate, regular rhythm and intact distal pulses.   Pulmonary/Chest: Effort normal. No respiratory distress. He has no wheezes.  Respirations even and unlabored  Abdominal: Soft. He exhibits no distension. There is tenderness. There is guarding. There is no rebound.  TTP periumbilically with voluntary guarding. No masses or peritoneal signs. Bowel sounds hypoactive.  Musculoskeletal: Normal range of motion.  Neurological: He is alert and oriented to person, place, and time. He exhibits normal muscle tone. Coordination normal.  GCS 15. Speech is goal oriented. Patient moving all extremities.  Skin: Skin is warm and dry. No rash noted. He is not diaphoretic. No erythema. No pallor.  Psychiatric: He has a normal mood and affect. His behavior is normal.  Nursing note and vitals reviewed.   ED Course  Procedures (including critical care time) Labs Review Labs Reviewed  LIPASE,  BLOOD - Abnormal; Notable for the following:    Lipase 156 (*)    All other components within normal limits  COMPREHENSIVE METABOLIC PANEL - Abnormal; Notable for the following:    Glucose, Bld 131 (*)    All other components within normal limits  CBC - Abnormal; Notable for the following:    WBC 12.6 (*)    All  other components within normal limits  I-STAT CG4 LACTIC ACID, ED - Abnormal; Notable for the following:    Lactic Acid, Venous 2.27 (*)    All other components within normal limits  URINALYSIS, ROUTINE W REFLEX MICROSCOPIC (NOT AT [facility])    Imaging Review Ct Abdomen Pelvis W Contrast  06/09/2015  CLINICAL DATA:  Abdominal pain, leukocytosis, elevated lipase. EXAM: CT ABDOMEN AND PELVIS WITH CONTRAST TECHNIQUE: Multidetector CT imaging of the abdomen and pelvis was performed using the standard protocol following bolus administration of intravenous contrast. CONTRAST:  191mL OMNIPAQUE IOHEXOL 300 MG/ML SOLN, 68mL OMNIPAQUE IOHEXOL 300 MG/ML SOLN COMPARISON:  03/11/2015 FINDINGS: Lower chest and abdominal wall:  No contributory findings. Hepatobiliary: No focal liver abnormality.No evidence of biliary obstruction or stone. Pancreas: Unremarkable. Spleen: Unremarkable. Adrenals/Urinary Tract: Negative adrenals. No hydronephrosis or stone. Mildly distended with mild circumferential bladder wall thickening, likely related to prostate enlargement. Reproductive:Symmetric enlargement of the prostate, stable. Stomach/Bowel: Gas dilated proximal and transverse colon with fluid level and patchy wall thickening, greatest along the proximal transverse segment where it is low-density submucosal edema. There is pericolonic fat edema, nodal enlargement, and small regional ascites. At the distal transverse segment there is caliber change with thick walled enhancing appearance, similar findings also seen on the previous study. No high-grade obstruction or perforation. No acute appendicitis. No skip inflammation identified. Vascular/Lymphatic: No acute vascular abnormality. Peritoneal: Small ascites along the right para colic gutter. Musculoskeletal: No acute abnormalities. 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 No sacroiliitis. IMPRESSION: 1. Proximal and transverse colitis. Appearance is nonspecific  but chronic or recurrent disease compared to September 2016 CT increases chances of autoimmune colitis. 2. Persistent focal collapse and thickening of the distal transverse colon, recommend colonoscopy to evaluate for mass lesion. Electronically Signed   By: Monte Fantasia M.D.   On: 06/09/2015 06:05     I have personally reviewed and evaluated these images and lab results as part of my medical decision-making.   EKG Interpretation None      MDM   Final diagnoses:  Colitis    Patient presenting to the emergency department for abdominal pain with nausea and vomiting. He has a hx of colitis and has been on ciprofloxacin with persistent pain. He is afebrile today. Lactate mildly elevated. Patient has a leukocytosis of 12.6. CT shows proximal and transverse colitis. CT questions autoimmune colitis and further recommends colonoscopy. Given that patient has failed outpatient management with oral antibiotics, will admit for IV antibiotics. Patient has also required IV Dilaudid x 2 for pain control. Case discussed with Dr. Alcario Drought of [facility]; [facility] to admit.   Filed Vitals:   06/09/15 0158 06/09/15 0208 06/09/15 0241 06/09/15 0525  BP: 153/104 148/103  141/93  Pulse: 75 77  78  Temp: 99 F (37.2 C) 98.7 F (37.1 C)    TempSrc: Oral Oral    Resp: 20 20  20   Height:   5\' 8"  (1.727 m)   Weight:   59.421 kg   SpO2: 100% 99%  98%       Antonietta Breach, PA-C 06/09/15 [phone]  Orpah Greek, MD 06/09/15 [phone]

## 2015-06-09 NOTE — H&P (Addendum)
Patient Demographics:    John Moon, is a 44 y.o. male  MRN: [id number]   DOB - Feb 03, 1971  Admit Date - 06/09/2015  Outpatient Primary MD for the patient is No primary care provider on file.   With History of -  History reviewed. No pertinent past medical history.    No past surgical history on file.  in for   Chief Complaint  Patient presents with  . Emesis      HPI:    John Moon  is a 44 y.o. male, with no past medical history, was diagnosed about a month ago with colitis and placed on Cipro for a few days, he initially got better but then about a week ago started developing right lower quadrant abdominal pain associated with some nausea vomiting and loose stools. Symptoms continued so he came to the ER, in the ER blood work was unremarkable but CT scan of the abdomen and pelvis showed proximal and transverse colitis, there was also a question of a mass lesion. GI was consulted and I was called to admit the patient.  Patient denies any fever or chills, denies any unintentional weight loss, denies any chest pain, phlegm shortness of breath. No focal deficits.    Review of systems:    In addition to the HPI above,   No Fever-chills, No Headache, No changes with Vision or hearing, No problems swallowing food or Liquids, No Chest pain, Cough or Shortness of Breath, GI symptoms as above, No Blood in stool or Urine, No dysuria, No new skin rashes or bruises, No new joints pains-aches,  No new weakness, tingling, numbness in any extremity, No recent weight gain or loss, No polyuria, polydypsia or polyphagia, No significant Mental Stressors.  A full 10 point Review of Systems was done, except as stated above, all other Review of Systems were negative.    Social History:     Social History    Substance Use Topics  . Smoking status: Current Every Day Smoker -- 1.00 packs/day    Types: Cigarettes  . Smokeless tobacco: Not on file  . Alcohol Use: 1.2 oz/week    2 Standard drinks or equivalent per week    Lives - at home with his wife and family, works at a local [employer]      Family History :   Denies any family history of colitis or colon cancer   Home Medications:   Prior to Admission medications   Medication Sig Start Date End Date Taking? Authorizing Provider  ciprofloxacin (CIPRO) 500 MG tablet Take 1 tablet (500 mg total) by mouth 2 (two) times daily. One po bid x 10 days Patient not taking: Reported on 06/09/2015 03/12/15   Wandra Arthurs, MD  metroNIDAZOLE (FLAGYL) 500 MG tablet Take 1 tablet (500 mg total) by mouth 2 (two) times daily. One po bid x 10 days Patient not taking: Reported on 06/09/2015 03/12/15  Wandra Arthurs, MD     Allergies:    No Known Allergies   Physical Exam:   Vitals  Blood pressure 168/93, pulse 75, temperature 99.6 F (37.6 C), temperature source Oral, resp. rate 21, height 5\' 8"  (1.727 m), weight 59.421 kg (131 lb), SpO2 100 %.   1. General middle-aged Asian male lying in bed in NAD,     2. Normal affect and insight, Not Suicidal or Homicidal, Awake Alert, Oriented X 3.  3. No F.N deficits, ALL C.Nerves Intact, Strength 5/5 all 4 extremities, Sensation intact all 4 extremities, Plantars down going.  4. Ears and Eyes appear Normal, Conjunctivae clear, PERRLA. Moist Oral Mucosa.  5. Supple Neck, No JVD, No cervical lymphadenopathy appriciated, No Carotid Bruits.  6. Symmetrical Chest wall movement, Good air movement bilaterally, CTAB.  7. RRR, No Gallops, Rubs or Murmurs, No Parasternal Heave.  8. Positive Bowel Sounds, Abdomen Soft, mild right lower quadrant tenderness, No organomegaly appriciated,No rebound -guarding or rigidity.  9.  No Cyanosis, Normal Skin Turgor, No Skin Rash or Bruise.  10. Good muscle tone,  joints  appear normal , no effusions, Normal ROM.  11. No Palpable Lymph Nodes in Neck or Axillae      Data Review:    CBC  Recent Labs Lab 06/09/15 0229  WBC 12.6*  HGB 15.2  HCT 45.2  PLT 227  MCV 92.2  MCH 31.0  MCHC 33.6  RDW 12.9   ------------------------------------------------------------------------------------------------------------------  Chemistries   Recent Labs Lab 06/09/15 0229  NA 139  K 4.0  CL 104  CO2 23  GLUCOSE 131*  BUN 11  CREATININE 0.66  CALCIUM 9.1  AST 18  ALT 17  ALKPHOS 76  BILITOT 0.4   ------------------------------------------------------------------------------------------------------------------ estimated creatinine clearance is 98 mL/min (by C-G formula based on Cr of 0.66). ------------------------------------------------------------------------------------------------------------------ No results for input(s): TSH, T4TOTAL, T3FREE, THYROIDAB in the last 72 hours.  Invalid input(s): FREET3   Coagulation profile No results for input(s): INR, PROTIME in the last 168 hours. ------------------------------------------------------------------------------------------------------------------- No results for input(s): DDIMER in the last 72 hours. -------------------------------------------------------------------------------------------------------------------  Cardiac Enzymes No results for input(s): CKMB, TROPONINI, MYOGLOBIN in the last 168 hours.  Invalid input(s): CK ------------------------------------------------------------------------------------------------------------------ Invalid input(s): POCBNP   ---------------------------------------------------------------------------------------------------------------  Urinalysis    Component Value Date/Time   BILIRUBINUR negative 03/11/2015 1939   KETONESUR small (15)* 03/11/2015 1939   UROBILINOGEN 0.2 03/11/2015 1939   NITRITE Negative 03/11/2015 1939   LEUKOCYTESUR  Negative 03/11/2015 1939    ----------------------------------------------------------------------------------------------------------------   Imaging Results:    Ct Abdomen Pelvis W Contrast  06/09/2015  CLINICAL DATA:  Abdominal pain, leukocytosis, elevated lipase. EXAM: CT ABDOMEN AND PELVIS WITH CONTRAST TECHNIQUE: Multidetector CT imaging of the abdomen and pelvis was performed using the standard protocol following bolus administration of intravenous contrast. CONTRAST:  129mL OMNIPAQUE IOHEXOL 300 MG/ML SOLN, 30mL OMNIPAQUE IOHEXOL 300 MG/ML SOLN COMPARISON:  03/11/2015 FINDINGS: Lower chest and abdominal wall:  No contributory findings. Hepatobiliary: No focal liver abnormality.No evidence of biliary obstruction or stone. Pancreas: Unremarkable. Spleen: Unremarkable. Adrenals/Urinary Tract: Negative adrenals. No hydronephrosis or stone. Mildly distended with mild circumferential bladder wall thickening, likely related to prostate enlargement. Reproductive:Symmetric enlargement of the prostate, stable. Stomach/Bowel: Gas dilated proximal and transverse colon with fluid level and patchy wall thickening, greatest along the proximal transverse segment where it is low-density submucosal edema. There is pericolonic fat edema, nodal enlargement, and small regional ascites. At the distal transverse segment there is caliber change with thick walled enhancing appearance,  similar findings also seen on the previous study. No high-grade obstruction or perforation. No acute appendicitis. No skip inflammation identified. Vascular/Lymphatic: No acute vascular abnormality. Peritoneal: Small ascites along the right para colic gutter. Musculoskeletal: No acute abnormalities. 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 No sacroiliitis. IMPRESSION: 1. Proximal and transverse colitis. Appearance is nonspecific but chronic or recurrent disease compared to September 2016 CT increases chances of autoimmune  colitis. 2. Persistent focal collapse and thickening of the distal transverse colon, recommend colonoscopy to evaluate for mass lesion. Electronically Signed   By: Monte Fantasia M.D.   On: 06/09/2015 06:05      Assessment & Plan:     1. Colitis. Etiology unclear. Recent exposure to Cipro therefore will rule out C. difficile, continue oral Flagyl, clear liquids and IV fluids, supportive care. GI to evaluate.  2. History of smoking. Counseled to quit.  3. Hypertension. No history of the same, pain control, scheduled Norvasc and as needed IV hydralazine.   DVT Prophylaxis  SCDs    AM Labs Ordered, also please review Full Orders  Family Communication: Admission, patients condition and plan of care including tests being ordered have been discussed with the patient   who indicates understanding and agree with the plan and Code Status.  Code Status Full  Likely DC to  Home 3-4 days  Condition GUARDED    Time spent in minutes : 35    SINGH,PRASHANT K M.D on 06/09/2015 at 8:21 AM  Between 7am to 7pm - Pager - [phone]  After 7pm go to [email or web address] - password [facility]  [facility] - Office  [phone]

## 2015-06-10 ENCOUNTER — Encounter (HOSPITAL_COMMUNITY): Payer: Self-pay | Admitting: *Deleted

## 2015-06-10 ENCOUNTER — Inpatient Hospital Stay (HOSPITAL_COMMUNITY): Payer: 59

## 2015-06-10 ENCOUNTER — Inpatient Hospital Stay (HOSPITAL_COMMUNITY): Payer: 59 | Admitting: Anesthesiology

## 2015-06-10 ENCOUNTER — Encounter (HOSPITAL_COMMUNITY)
Admission: EM | Disposition: A | Discharge status: Home / Self Care | Payer: Self-pay | Source: Home / Self Care | Attending: Internal Medicine

## 2015-06-10 DIAGNOSIS — D124 Benign neoplasm of descending colon: Secondary | ICD-10-CM

## 2015-06-10 DIAGNOSIS — C184 Malignant neoplasm of transverse colon: Principal | ICD-10-CM

## 2015-06-10 DIAGNOSIS — D128 Benign neoplasm of rectum: Secondary | ICD-10-CM

## 2015-06-10 DIAGNOSIS — K6389 Other specified diseases of intestine: Secondary | ICD-10-CM

## 2015-06-10 DIAGNOSIS — R1013 Epigastric pain: Secondary | ICD-10-CM

## 2015-06-10 DIAGNOSIS — D126 Benign neoplasm of colon, unspecified: Secondary | ICD-10-CM | POA: Insufficient documentation

## 2015-06-10 DIAGNOSIS — D125 Benign neoplasm of sigmoid colon: Secondary | ICD-10-CM

## 2015-06-10 DIAGNOSIS — D123 Benign neoplasm of transverse colon: Secondary | ICD-10-CM

## 2015-06-10 HISTORY — PX: COLONOSCOPY WITH PROPOFOL: SHX5780

## 2015-06-10 LAB — BASIC METABOLIC PANEL
Anion gap: 12 (ref 5–15)
BUN: 7 mg/dL (ref 6–20)
CALCIUM: 9.4 mg/dL (ref 8.9–10.3)
CO2: 23 mmol/L (ref 22–32)
CREATININE: 0.65 mg/dL (ref 0.61–1.24)
Chloride: 105 mmol/L (ref 101–111)
GFR calc Af Amer: >60 mL/min (ref >60)
Glucose, Bld: 140 mg/dL — ABNORMAL HIGH (ref 65–99)
Potassium: 4 mmol/L (ref 3.5–5.1)
SODIUM: 140 mmol/L (ref 135–145)

## 2015-06-10 LAB — CBC
HCT: 48.6 % (ref 39.0–52.0)
Hemoglobin: 16.4 g/dL (ref 13.0–17.0)
MCH: 30.7 pg (ref 26.0–34.0)
MCHC: 33.7 g/dL (ref 30.0–36.0)
MCV: 90.8 fL (ref 78.0–100.0)
PLATELETS: 261 10*3/uL (ref 150–400)
RBC: 5.35 MIL/uL (ref 4.22–5.81)
RDW: 12.8 % (ref 11.5–15.5)
WBC: 19.6 10*3/uL — AB (ref 4.0–10.5)

## 2015-06-10 SURGERY — COLONOSCOPY WITH PROPOFOL
Anesthesia: Monitor Anesthesia Care

## 2015-06-10 MED ORDER — PROPOFOL 10 MG/ML IV BOLUS
INTRAVENOUS | Status: AC
  Filled 2015-06-10: qty 40

## 2015-06-10 MED ORDER — ALBUTEROL SULFATE HFA 108 (90 BASE) MCG/ACT IN AERS
INHALATION_SPRAY | RESPIRATORY_TRACT | Status: DC | PRN
  Administered 2015-06-10 (×2): 6 via RESPIRATORY_TRACT

## 2015-06-10 MED ORDER — PROPOFOL 10 MG/ML IV BOLUS
INTRAVENOUS | Status: DC | PRN
  Administered 2015-06-10: 170 mg via INTRAVENOUS

## 2015-06-10 MED ORDER — ONDANSETRON HCL 4 MG/2ML IJ SOLN
INTRAMUSCULAR | Status: AC
  Filled 2015-06-10: qty 2

## 2015-06-10 MED ORDER — SODIUM CHLORIDE 0.9 % IV BOLUS (SEPSIS)
1000.0000 mL | Freq: Once | INTRAVENOUS | Status: AC
  Administered 2015-06-10: 1000 mL via INTRAVENOUS

## 2015-06-10 MED ORDER — ALBUTEROL SULFATE HFA 108 (90 BASE) MCG/ACT IN AERS
INHALATION_SPRAY | RESPIRATORY_TRACT | Status: AC
  Filled 2015-06-10: qty 6.7

## 2015-06-10 MED ORDER — PROPOFOL 500 MG/50ML IV EMUL
INTRAVENOUS | Status: DC | PRN
  Administered 2015-06-10 (×2): 200 ug/kg/min via INTRAVENOUS

## 2015-06-10 MED ORDER — LACTATED RINGERS IV SOLN
INTRAVENOUS | Status: DC
Start: 2015-06-10 — End: 2015-06-10
  Administered 2015-06-10: 13:00:00 via INTRAVENOUS
  Administered 2015-06-10: 1000 mL via INTRAVENOUS

## 2015-06-10 MED ORDER — HYDROCODONE-ACETAMINOPHEN 5-325 MG PO TABS
1.0000 | ORAL_TABLET | ORAL | Status: DC | PRN
  Administered 2015-06-10: 1 via ORAL
  Filled 2015-06-10: qty 1

## 2015-06-10 MED ORDER — GLYCOPYRROLATE 0.2 MG/ML IJ SOLN
INTRAMUSCULAR | Status: DC | PRN
Start: 2015-06-10 — End: 2015-06-10
  Administered 2015-06-10: 0.2 mg via INTRAVENOUS

## 2015-06-10 MED ORDER — GLYCOPYRROLATE 0.2 MG/ML IJ SOLN
INTRAMUSCULAR | Status: AC
  Filled 2015-06-10: qty 1

## 2015-06-10 MED ORDER — SODIUM CHLORIDE 0.9 % IV SOLN
INTRAVENOUS | Status: AC
  Administered 2015-06-10 – 2015-06-11 (×2): via INTRAVENOUS

## 2015-06-10 MED ORDER — SUCCINYLCHOLINE CHLORIDE 20 MG/ML IJ SOLN
INTRAMUSCULAR | Status: DC | PRN
  Administered 2015-06-10: 100 mg via INTRAVENOUS

## 2015-06-10 MED ORDER — ONDANSETRON HCL 4 MG/2ML IJ SOLN
INTRAMUSCULAR | Status: DC | PRN
  Administered 2015-06-10: 4 mg via INTRAVENOUS

## 2015-06-10 MED ORDER — LABETALOL HCL 5 MG/ML IV SOLN
INTRAVENOUS | Status: AC
  Filled 2015-06-10: qty 4

## 2015-06-10 MED ORDER — LIDOCAINE HCL (CARDIAC) 20 MG/ML IV SOLN
INTRAVENOUS | Status: AC
  Filled 2015-06-10: qty 5

## 2015-06-10 MED ORDER — DEXAMETHASONE SODIUM PHOSPHATE 10 MG/ML IJ SOLN
INTRAMUSCULAR | Status: AC
  Filled 2015-06-10: qty 1

## 2015-06-10 MED ORDER — LIDOCAINE HCL (CARDIAC) 20 MG/ML IV SOLN
INTRAVENOUS | Status: DC | PRN
  Administered 2015-06-10 (×2): 50 mg via INTRAVENOUS

## 2015-06-10 MED ORDER — SODIUM CHLORIDE 0.9 % IV SOLN: INTRAVENOUS | Status: DC

## 2015-06-10 MED ORDER — LABETALOL HCL 5 MG/ML IV SOLN
INTRAVENOUS | Status: DC | PRN
  Administered 2015-06-10 (×2): 2.5 mg via INTRAVENOUS

## 2015-06-10 SURGICAL SUPPLY — 22 items

## 2015-06-10 NOTE — Progress Notes (Signed)
No bm after 2 rounds movie prep.. Emesis noted at 0200, approx 100 cc bilious

## 2015-06-10 NOTE — Op Note (Signed)
[facility] [place], [id number]   COLONOSCOPY PROCEDURE REPORT  PATIENT: John Moon, John Moon  MR#: [id number] BIRTHDATE: Jan 26, 1971 , 77  yrs. old GENDER: male ENDOSCOPIST: Harl Bowie, MD REFERRED [id number] Hospitalist PROCEDURE DATE:  06/10/2015 PROCEDURE:   Colonoscopy, diagnostic, Colonoscopy with biopsy, and Colonoscopy with snare polypectomy First Screening Colonoscopy - Avg.  risk and is 50 yrs.  old or older Yes.  Prior Negative Screening - Now for repeat screening. N/A  History of Adenoma - Now for follow-up colonoscopy & has been > or = to 3 yrs.  N/A  Polyps removed today? Yes ASA CLASS:   Class II INDICATIONS:abnormal findings on CT abdomen and pelvis, right lower quadrant abdominal pain and Colorectal Neoplasm Risk Assessment for this procedure is average risk. MEDICATIONS: Monitored anesthesia care  DESCRIPTION OF PROCEDURE:   After the risks benefits and alternatives of the procedure were thoroughly explained, informed consent was obtained.  The digital rectal exam revealed no abnormalities of the rectum.   The Pentax Adult Colon [id number] endoscope was introduced through the anus and advanced to the distal transverse colon. No adverse events experienced.   The quality of the prep was fair.  The instrument was then slowly withdrawn as the colon was fully examined. Estimated blood loss is zero unless otherwise noted in this procedure report. 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  11 sessile polyps Transverse colon (7), descending colon (1), sigmoid colon (2 ) and rectum ranging in size from 6 mm to 11 mm removed with cold and hot snare.  Small internal  hemorrhoids.  Retroflexed views revealed internal hemorrhoids. The time to cecum = 16.1 Withdrawal time = 53.6   The scope was withdrawn and the procedure completed. COMPLICATIONS: There were no immediate complications.  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 11 sessile polyps removed  RECOMMENDATIONS: await pathology results Clear liquid diet   eSigned:  Harl Bowie, MD 06/10/2015 2:01 PM

## 2015-06-10 NOTE — Transfer of Care (Signed)
Immediate Anesthesia Transfer of Care Note  Patient: John Moon  Procedure(s) Performed: Procedure(s): COLONOSCOPY WITH PROPOFOL (N/A)  Patient Location: PACU  Anesthesia Type:General  Level of Consciousness: awake, alert , oriented and patient cooperative  Airway & Oxygen Therapy: Patient Spontanous Breathing and Patient connected to face mask oxygen  Post-op Assessment: Report given to RN, Post -op Vital signs reviewed and stable and Patient moving all extremities X 4  Post vital signs: stable  Last Vitals:  Filed Vitals:   06/10/15 0545 06/10/15 1206  BP: 147/98 124/86  Pulse: 90 80  Temp: 36.7 C 36.7 C  Resp: 20 14    Complications: No apparent anesthesia complications

## 2015-06-10 NOTE — Anesthesia Preprocedure Evaluation (Signed)
Anesthesia Evaluation  Patient identified by MRN, date of birth, ID band Patient awake    Reviewed: Allergy & Precautions, NPO status , Patient's Chart, lab work & pertinent test results  History of Anesthesia Complications Negative for: history of anesthetic complications  Airway Mallampati: II  TM Distance: >3 FB Neck ROM: Full    Dental no notable dental hx.    Pulmonary Current Smoker,    Pulmonary exam normal breath sounds clear to auscultation       Cardiovascular negative cardio ROS Normal cardiovascular exam Rhythm:Regular Rate:Normal     Neuro/Psych negative neurological ROS  negative psych ROS   GI/Hepatic negative GI ROS, Neg liver ROS,   Endo/Other  negative endocrine ROS  Renal/GU negative Renal ROS  negative genitourinary   Musculoskeletal negative musculoskeletal ROS (+)   Abdominal   Peds negative pediatric ROS (+)  Hematology negative hematology ROS (+)   Anesthesia Other Findings   Reproductive/Obstetrics negative OB ROS                             Anesthesia Physical Anesthesia Plan  ASA: II  Anesthesia Plan: MAC   Post-op Pain Management:    Induction: Intravenous  Airway Management Planned: Nasal Cannula  Additional Equipment:   Intra-op Plan:   Post-operative Plan:   Informed Consent: I have reviewed the patients History and Physical, chart, labs and discussed the procedure including the risks, benefits and alternatives for the proposed anesthesia with the patient or authorized representative who has indicated his/her understanding and acceptance.   Dental advisory given  Plan Discussed with: CRNA  Anesthesia Plan Comments:         Anesthesia Quick Evaluation

## 2015-06-10 NOTE — Anesthesia Procedure Notes (Signed)
Procedure Name: Intubation Date/Time: 06/10/2015 12:55 PM Performed by: Lissa Morales Pre-anesthesia Checklist: Patient identified, Emergency Drugs available, Suction available and Patient being monitored Patient Re-evaluated:Patient Re-evaluated prior to inductionOxygen Delivery Method: Circle System Utilized Preoxygenation: Pre-oxygenation with 100% oxygen Intubation Type: IV induction Ventilation: Mask ventilation without difficulty Laryngoscope Size: Mac and 4 Grade View: Grade II Tube type: Oral Tube size: 7.0 mm Number of attempts: 1 Airway Equipment and Method: Stylet and Oral airway (arytenoids very swollen) Placement Confirmation: ETT inserted through vocal cords under direct vision,  positive ETCO2 and breath sounds checked- equal and bilateral Secured at: 21 cm Tube secured with: Tape Dental Injury: Teeth and Oropharynx as per pre-operative assessment

## 2015-06-10 NOTE — Progress Notes (Addendum)
Patient Demographics:    John Moon, is a 44 y.o. male, DOB - 1971-01-13, [id number]  Admit date - 06/09/2015   Admitting Physician Etta Quill, DO  Outpatient Primary MD for the patient is No primary care provider on file.  LOS - 1   Chief Complaint  Patient presents with  . Emesis        Subjective:    John Moon today has, No headache, No chest pain, still has some diarrhea and generalized abdominal pain - No Nausea, No new weakness tingling or numbness, No Cough - SOB.    Assessment  & Plan :     1. Colitis. Etiology unclear. Recent exposure to Cipro therefore will rule out C. difficile, await results for C. difficile nursing staff informed to collect a sample, continue oral Flagyl, and any gentle IV fluids for hydration along with supportive care. GI on board scheduled for colonoscopy later today.  2. History of smoking. Counseled to quit.  3. Hypertension. No history of the same, pain control, stop scheduled Norvasc and as needed IV hydralazine. IV fluid bolus if blood pressure is low.    Code Status : Full  Family Communication  : Wife bedside  Disposition Plan  : Home in 1-2 days  Consults  :  GI  Procedures  :   CT scan abdomen and pelvis questioning colitis and possible mass.  Due for colonoscopy on 06/10/2015  DVT Prophylaxis  :  Heparin   Lab Results  Component Value Date   PLT 261 06/10/2015    Inpatient Medications  Scheduled Meds: . amLODipine  10 mg Oral Daily  . heparin  5,000 Units Subcutaneous 3 times per day  . metroNIDAZOLE  500 mg Oral 3 times per day  . ondansetron  4 mg Oral 4 times per day   Or  . ondansetron (ZOFRAN) IV  4 mg Intravenous 4 times per day   Continuous Infusions: . sodium chloride 100 mL/hr at 06/09/15 2110   PRN  Meds:.guaiFENesin-dextromethorphan, hydrALAZINE, HYDROcodone-acetaminophen  Antibiotics  :    Anti-infectives    Start     Dose/Rate Route Frequency Ordered Stop   06/09/15 0830  metroNIDAZOLE (FLAGYL) tablet 500 mg     500 mg Oral 3 times per day 06/09/15 0825     06/09/15 0230  ciprofloxacin (CIPRO) IVPB 400 mg     400 mg 200 mL/hr over 60 Minutes Intravenous  Once 06/09/15 0222 06/09/15 0345   06/09/15 0230  metroNIDAZOLE (FLAGYL) IVPB 500 mg     500 mg 100 mL/hr over 60 Minutes Intravenous  Once 06/09/15 0222 06/09/15 0603        Objective:   Filed Vitals:   06/09/15 0802 06/09/15 0908 06/09/15 2245 06/10/15 0545  BP: 168/93 157/99 145/99 147/98  Pulse: 75 75 94 90  Temp: 99.6 F (37.6 C) 98.6 F (37 C) 98.7 F (37.1 C) 98.1 F (36.7 C)  TempSrc: Oral Oral Oral Oral  Resp: 21 20 20 20   Height:  5\' 8"  (1.727 m)    Weight:  59.557 kg (131 lb 4.8 oz)  59.1 kg (130 lb 4.7 oz)  SpO2: 100% 99% 98% 98%    Wt Readings from Last 3 Encounters:  06/10/15 59.1 kg (  130 lb 4.7 oz)  03/11/15 59.421 kg (131 lb)     Intake/Output Summary (Last 24 hours) at 06/10/15 0925 Last data filed at 06/10/15 0600  Gross per 24 hour  Intake 2968.33 ml  Output    425 ml  Net 2543.33 ml     Physical Exam  Awake Alert, Oriented X 3, No new F.N deficits, Normal affect [place].AT,PERRAL Supple Neck,No JVD, No cervical lymphadenopathy appriciated.  Symmetrical Chest wall movement, Good air movement bilaterally, CTAB RRR,No Gallops,Rubs or new Murmurs, No Parasternal Heave +ve B.Sounds, Abd Soft, mild generalised tenderness, No organomegaly appriciated, No rebound - guarding or rigidity. No Cyanosis, Clubbing or edema, No new Rash or bruise       Data Review:   Micro Results No results found for this or any previous visit (from the past 240 hour(s)).  Radiology Reports Ct Abdomen Pelvis W Contrast  06/09/2015  CLINICAL DATA:  Abdominal pain, leukocytosis, elevated lipase. EXAM: CT  ABDOMEN AND PELVIS WITH CONTRAST TECHNIQUE: Multidetector CT imaging of the abdomen and pelvis was performed using the standard protocol following bolus administration of intravenous contrast. CONTRAST:  123mL OMNIPAQUE IOHEXOL 300 MG/ML SOLN, 54mL OMNIPAQUE IOHEXOL 300 MG/ML SOLN COMPARISON:  03/11/2015 FINDINGS: Lower chest and abdominal wall:  No contributory findings. Hepatobiliary: No focal liver abnormality.No evidence of biliary obstruction or stone. Pancreas: Unremarkable. Spleen: Unremarkable. Adrenals/Urinary Tract: Negative adrenals. No hydronephrosis or stone. Mildly distended with mild circumferential bladder wall thickening, likely related to prostate enlargement. Reproductive:Symmetric enlargement of the prostate, stable. Stomach/Bowel: Gas dilated proximal and transverse colon with fluid level and patchy wall thickening, greatest along the proximal transverse segment where it is low-density submucosal edema. There is pericolonic fat edema, nodal enlargement, and small regional ascites. At the distal transverse segment there is caliber change with thick walled enhancing appearance, similar findings also seen on the previous study. No high-grade obstruction or perforation. No acute appendicitis. No skip inflammation identified. Vascular/Lymphatic: No acute vascular abnormality. Peritoneal: Small ascites along the right para colic gutter. Musculoskeletal: No acute abnormalities. 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 No sacroiliitis. IMPRESSION: 1. Proximal and transverse colitis. Appearance is nonspecific but chronic or recurrent disease compared to September 2016 CT increases chances of autoimmune colitis. 2. Persistent focal collapse and thickening of the distal transverse colon, recommend colonoscopy to evaluate for mass lesion. Electronically Signed   By: Monte Fantasia M.D.   On: 06/09/2015 06:05     CBC  Recent Labs Lab 06/09/15 0229 06/10/15 0455  WBC 12.6*  19.6*  HGB 15.2 16.4  HCT 45.2 48.6  PLT 227 261  MCV 92.2 90.8  MCH 31.0 30.7  MCHC 33.6 33.7  RDW 12.9 12.8    Chemistries   Recent Labs Lab 06/09/15 0229 06/10/15 0455  NA 139 140  K 4.0 4.0  CL 104 105  CO2 23 23  GLUCOSE 131* 140*  BUN 11 7  CREATININE 0.66 0.65  CALCIUM 9.1 9.4  AST 18  --   ALT 17  --   ALKPHOS 76  --   BILITOT 0.4  --    ------------------------------------------------------------------------------------------------------------------ estimated creatinine clearance is 97.5 mL/min (by C-G formula based on Cr of 0.65). ------------------------------------------------------------------------------------------------------------------ No results for input(s): HGBA1C in the last 72 hours. ------------------------------------------------------------------------------------------------------------------ No results for input(s): CHOL, HDL, LDLCALC, TRIG, CHOLHDL, LDLDIRECT in the last 72 hours. ------------------------------------------------------------------------------------------------------------------ No results for input(s): TSH, T4TOTAL, T3FREE, THYROIDAB in the last 72 hours.  Invalid input(s): FREET3 ------------------------------------------------------------------------------------------------------------------ No results for input(s): VITAMINB12,  FOLATE, FERRITIN, TIBC, IRON, RETICCTPCT in the last 72 hours.  Coagulation profile No results for input(s): INR, PROTIME in the last 168 hours.  No results for input(s): DDIMER in the last 72 hours.  Cardiac Enzymes No results for input(s): CKMB, TROPONINI, MYOGLOBIN in the last 168 hours.  Invalid input(s): CK ------------------------------------------------------------------------------------------------------------------ Invalid input(s): POCBNP   Time Spent in minutes  35   Akua Blethen K M.D on 06/10/2015 at 9:25 AM  Between 7am to 7pm - Pager - [phone]  After 7pm go to  [email or web address] - password [facility]  [facility] -  Office  [phone]

## 2015-06-10 NOTE — Anesthesia Postprocedure Evaluation (Signed)
Anesthesia Post Note  Patient: RIGEL KOYANAGI  Procedure(s) Performed: Procedure(s) (LRB): COLONOSCOPY WITH PROPOFOL (N/A)  Patient location during evaluation: PACU Anesthesia Type: MAC and General Level of consciousness: awake and alert Pain management: pain level controlled Vital Signs Assessment: post-procedure vital signs reviewed and stable Respiratory status: spontaneous breathing, nonlabored ventilation, respiratory function stable and patient connected to nasal cannula oxygen Cardiovascular status: blood pressure returned to baseline and stable Postop Assessment: no signs of nausea or vomiting Anesthetic complications: no (Potential aspiration, see intraprocedure notes, lots of coughing and secretions, no vomiting during procedure)    Last Vitals:  Filed Vitals:   06/10/15 1400 06/10/15 1405  BP: 134/88 130/85  Pulse: 91 88  Temp:    Resp: 18 18    Last Pain:  Filed Vitals:   06/10/15 1405  PainSc: 4                  Loretta Doutt JENNETTE

## 2015-06-10 NOTE — Progress Notes (Signed)
For colonoscopy this afternoon.  Drank prep, had a little vomiting with it but got it all down.  Coming out liquid from bottom and becoming yellowish/clear.

## 2015-06-11 ENCOUNTER — Inpatient Hospital Stay (HOSPITAL_COMMUNITY): Payer: 59

## 2015-06-11 ENCOUNTER — Encounter (HOSPITAL_COMMUNITY): Payer: Self-pay | Admitting: Gastroenterology

## 2015-06-11 DIAGNOSIS — T17998A Other foreign object in respiratory tract, part unspecified causing other injury, initial encounter: Secondary | ICD-10-CM

## 2015-06-11 DIAGNOSIS — C189 Malignant neoplasm of colon, unspecified: Secondary | ICD-10-CM | POA: Insufficient documentation

## 2015-06-11 DIAGNOSIS — T17908A Unspecified foreign body in respiratory tract, part unspecified causing other injury, initial encounter: Secondary | ICD-10-CM | POA: Insufficient documentation

## 2015-06-11 LAB — BASIC METABOLIC PANEL
ANION GAP: 9 (ref 5–15)
BUN: 7 mg/dL (ref 6–20)
CALCIUM: 8.8 mg/dL — AB (ref 8.9–10.3)
CO2: 26 mmol/L (ref 22–32)
CREATININE: 0.74 mg/dL (ref 0.61–1.24)
Chloride: 106 mmol/L (ref 101–111)
Glucose, Bld: 105 mg/dL — ABNORMAL HIGH (ref 65–99)
Potassium: 3.7 mmol/L (ref 3.5–5.1)
SODIUM: 141 mmol/L (ref 135–145)

## 2015-06-11 LAB — C DIFFICILE QUICK SCREEN W PCR REFLEX
C Diff antigen: NEGATIVE
C Diff interpretation: NEGATIVE
C Diff toxin: NEGATIVE

## 2015-06-11 LAB — CBC
HEMATOCRIT: 38.5 % — AB (ref 39.0–52.0)
Hemoglobin: 12.7 g/dL — ABNORMAL LOW (ref 13.0–17.0)
MCH: 30.6 pg (ref 26.0–34.0)
MCHC: 33 g/dL (ref 30.0–36.0)
MCV: 92.8 fL (ref 78.0–100.0)
PLATELETS: 217 10*3/uL (ref 150–400)
RBC: 4.15 MIL/uL — ABNORMAL LOW (ref 4.22–5.81)
RDW: 13 % (ref 11.5–15.5)
WBC: 13.8 10*3/uL — AB (ref 4.0–10.5)

## 2015-06-11 LAB — MAGNESIUM: MAGNESIUM: 1.9 mg/dL (ref 1.7–2.4)

## 2015-06-11 MED ORDER — HEPARIN SODIUM (PORCINE) 5000 UNIT/ML IJ SOLN
5000.0000 [IU] | Freq: Three times a day (TID) | INTRAMUSCULAR | Status: AC
  Administered 2015-06-11 (×2): 5000 [IU] via SUBCUTANEOUS
  Filled 2015-06-11 (×2): qty 1

## 2015-06-11 MED ORDER — CHLORHEXIDINE GLUCONATE CLOTH 2 % EX PADS
6.0000 | MEDICATED_PAD | Freq: Once | CUTANEOUS | Status: AC
  Administered 2015-06-12: 6 via TOPICAL

## 2015-06-11 MED ORDER — IOHEXOL 300 MG/ML  SOLN
75.0000 mL | Freq: Once | INTRAMUSCULAR | Status: AC | PRN
  Administered 2015-06-11: 75 mL via INTRAVENOUS

## 2015-06-11 MED ORDER — CHLORHEXIDINE GLUCONATE CLOTH 2 % EX PADS
6.0000 | MEDICATED_PAD | Freq: Once | CUTANEOUS | Status: AC
  Administered 2015-06-11: 6 via TOPICAL

## 2015-06-11 MED ORDER — DEXTROSE 5 % IV SOLN
2.0000 g | INTRAVENOUS | Status: AC
  Administered 2015-06-12: 2 g via INTRAVENOUS

## 2015-06-11 NOTE — Progress Notes (Signed)
[facility] PROGRESS NOTE  John Moon [id number] DOB: May 09, 1971 DOA: 06/09/2015 PCP: No primary care provider on file.  HPI/Brief narrative John Moon today has, No headache, No chest pain, still has some diarrhea and generalized abdominal pain - No Nausea, No new weakness tingling or numbness, No Cough - SOB  Assessment/Plan: 1. Initially suspected Colitis.  -Patient status post colonoscopy on 06/10/2015. Patient noted to have multiple polyps with mass noted. Biopsy of mass is noted to be positive for malignancy. -Case discussed with GI, who has since discussed with general surgery. -Gen. surgery recommendations for possible resection versus total colectomy  2. History of smoking. Counseled to quit.  3. Hypertension. No history of the same, pain control, stop scheduled Norvasc and as needed IV hydralazine. IV fluid bolus if blood pressure is low.  4. Invasive adenocarcinoma of the colon -Per above, the patient is noted to have chronic mass with biopsy report positive for adenocarcinoma -Per above, general surgery is following with plans for possible total colectomy versus resection.  5. DVT prophylaxis -SCDs  Code Status: Full Family Communication: Pt in room, family at bedside Disposition Plan: Home when cleared by GI and Surgery   Consultants:  General surgery  Gastroenterology  Procedures:  Colonoscopy 06/10/2015  Antibiotics: Anti-infectives    Start     Dose/Rate Route Frequency Ordered Stop   06/12/15 0600  cefoTEtan (CEFOTAN) 2 g in dextrose 5 % 50 mL IVPB     2 g 100 mL/hr over 30 Minutes Intravenous 30 min pre-op 06/11/15 1237     06/09/15 0830  metroNIDAZOLE (FLAGYL) tablet 500 mg  Status:  Discontinued     500 mg Oral 3 times per day 06/09/15 0825 06/11/15 1011   06/09/15 0230  ciprofloxacin (CIPRO) IVPB 400 mg     400 mg 200 mL/hr over 60 Minutes Intravenous  Once 06/09/15 0222 06/09/15 0345   06/09/15 0230  metroNIDAZOLE (FLAGYL) IVPB  500 mg     500 mg 100 mL/hr over 60 Minutes Intravenous  Once 06/09/15 0222 06/09/15 0603      HPI/Subjective: Patient currently is without complaints. He denies abdominal pain.  Objective: Filed Vitals:   06/10/15 1450 06/10/15 2200 06/11/15 0652 06/11/15 1514  BP: 120/82 130/77 135/85 145/85  Pulse: 83 86 80 83  Temp:  98.5 F (36.9 C) 98.4 F (36.9 C) 99.4 F (37.4 C)  TempSrc:  Oral Oral Oral  Resp: 18 18  16   Height:      Weight:      SpO2: 97% 98% 99% 97%    Intake/Output Summary (Last 24 hours) at 06/11/15 1702 Last data filed at 06/11/15 0600  Gross per 24 hour  Intake 2064.58 ml  Output   1000 ml  Net 1064.58 ml   Filed Weights   06/09/15 0908 06/10/15 0545 06/10/15 1151  Weight: 59.557 kg (131 lb 4.8 oz) 59.1 kg (130 lb 4.7 oz) 58.968 kg (130 lb)    Exam:   General:  Awake, in nad  Cardiovascular: regular, s1, s2  Respiratory: normal resp effort, no wheezing  Abdomen: soft,nondistended  Musculoskeletal: perfused, no clubbing   Data Reviewed: Basic Metabolic Panel:  Recent Labs Lab 06/09/15 0229 06/10/15 0455 06/11/15 0520  NA 139 140 141  K 4.0 4.0 3.7  CL 104 105 106  CO2 23 23 26   GLUCOSE 131* 140* 105*  BUN 11 7 7   CREATININE 0.66 0.65 0.74  CALCIUM 9.1 9.4 8.8*  MG  --   --  1.9   Liver Function Tests:  Recent Labs Lab 06/09/15 0229  AST 18  ALT 17  ALKPHOS 76  BILITOT 0.4  PROT 7.5  ALBUMIN 4.4    Recent Labs Lab 06/09/15 0229  LIPASE 156*   No results for input(s): AMMONIA in the last 168 hours. CBC:  Recent Labs Lab 06/09/15 0229 06/10/15 0455 06/11/15 0520  WBC 12.6* 19.6* 13.8*  HGB 15.2 16.4 12.7*  HCT 45.2 48.6 38.5*  MCV 92.2 90.8 92.8  PLT 227 261 217   Cardiac Enzymes: No results for input(s): CKTOTAL, CKMB, CKMBINDEX, TROPONINI in the last 168 hours. BNP (last 3 results) No results for input(s): BNP in the last 8760 hours.  ProBNP (last 3 results) No results for input(s): PROBNP in the  last 8760 hours.  CBG: No results for input(s): GLUCAP in the last 168 hours.  Recent Results (from the past 240 hour(s))  C difficile quick scan w PCR reflex     Status: None   Collection Time: 06/11/15  8:55 AM  Result Value Ref Range Status   C Diff antigen NEGATIVE NEGATIVE Final   C Diff toxin NEGATIVE NEGATIVE Final   C Diff interpretation Negative for toxigenic C. difficile  Final     Studies: Ct Chest W Contrast  06/11/2015  CLINICAL DATA:  Restaging colon cancer. EXAM: CT CHEST WITH CONTRAST TECHNIQUE: Multidetector CT imaging of the chest was performed during intravenous contrast administration. CONTRAST:  26mL OMNIPAQUE IOHEXOL 300 MG/ML  SOLN COMPARISON:  CT abdomen pelvis 06/09/2015. FINDINGS: Mediastinum/Nodes: No pathologically enlarged mediastinal, hilar or axillary lymph nodes. Heart size normal. No pericardial effusion. Lungs/Pleura: Trace left pleural effusion. Trace paraseptal emphysema. Subsegmental atelectasis in the lower lobes, left greater than right. Peribronchovascular ground-glass nodularity in the left upper lobe (example series 5, image 23). Airway is unremarkable. Upper abdomen: Visualized portion of the liver is unremarkable. There may be vicarious excretion of contrast in the gallbladder. Visualized portions of the adrenal glands, kidneys, spleen, pancreas and stomach are grossly unremarkable. Dilated fluid-filled transverse colon with a mass at the junction of the transverse and descending colon (series 2, image 55). Small ascites. Musculoskeletal: No worrisome lytic or sclerotic lesions. IMPRESSION: 1. No evidence of metastatic disease in the chest. 2. Trace left pleural effusion with probable atelectasis in the left lower lobe. 3. Peribronchovascular ground-glass nodularity in the left upper lobe is likely infectious or inflammatory in etiology. 4. Fluid-filled and dilated transverse colon, indicative of obstruction related to a mass at the junction of the  transverse and descending colon. Please refer to CT abdomen pelvis 06/09/2015 for further details. Electronically Signed   By: John Moon M.D.   On: 06/11/2015 11:02   Dg Chest Port 1 View  06/10/2015  CLINICAL DATA:  Patient status post colonoscopy. Possible aspiration. EXAM: PORTABLE CHEST 1 VIEW COMPARISON:  Chest radiograph 03/11/2015. FINDINGS: Monitoring leads overlie the patient. Stable cardiac and mediastinal contours. Interval development of heterogeneous opacities predominately within the left lower lobe. No pleural effusion or pneumothorax. IMPRESSION: Heterogeneous opacities left lung base may represent atelectasis, aspiration or infection. No pleural effusion or pneumothorax. Electronically Signed   By: Lovey Newcomer M.D.   On: 06/10/2015 14:10    Scheduled Meds: . [START ON 06/12/2015] cefoTEtan (CEFOTAN) IV  2 g Intravenous 30 min Pre-Op  . Chlorhexidine Gluconate Cloth  6 each Topical Once   And  . [START ON 06/12/2015] Chlorhexidine Gluconate Cloth  6 each Topical Once  . heparin  5,000 Units  Subcutaneous 3 times per day  . ondansetron (ZOFRAN) IV  4 mg Intravenous 4 times per day   Continuous Infusions:   Active Problems:   Colitis   Colonic mass   Benign neoplasm of colon   Colon cancer ([place])    John Moon, [facility] Pager [phone]. If 7PM-7AM, please contact night-coverage at [email or web address], password [facility] 06/11/2015, 5:02 PM  LOS: 2 days

## 2015-06-11 NOTE — Progress Notes (Signed)
     [facility] Progress Note  Subjective:  Feels ok.  Results of pathology relayed to patient and his wife and also patient's younger brother was on the phone.  Objective:  Vital signs in last 24 hours: Temp:  [97.6 F (36.4 C)-98.5 F (36.9 C)] 98.4 F (36.9 C) (12/21 [id number]) Pulse Rate:  [80-91] 80 (12/21 0652) Resp:  [14-22] 18 (12/20 2200) BP: (120-151)/(77-92) 135/85 mmHg (12/21 0652) SpO2:  [97 %-100 %] 99 % (12/21 0652) Weight:  [130 lb (58.968 kg)] 130 lb (58.968 kg) (12/20 1151) Last BM Date: 06/11/15 General:  Alert, thin, in NAD Heart:  Regular rate and rhythm; no murmurs Pulm:  CTAB.  No W/R/R. Abdomen:  Soft, non-distended. Normal bowel sounds.  Mild upper abdominal TTP. Extremities:  Without edema. Neurologic:  Alert and oriented x 4; grossly normal neurologically. Psych:  Alert and cooperative. Normal mood and affect.  Intake/Output from previous day: 12/20 0701 - 12/21 0700 In: 3764.6 [I.V.:3764.6] Out: 1000 [Urine:1000]  Lab Results:  Recent Labs  06/09/15 0229 06/10/15 0455 06/11/15 0520  WBC 12.6* 19.6* 13.8*  HGB 15.2 16.4 12.7*  HCT 45.2 48.6 38.5*  PLT 227 261 217   BMET  Recent Labs  06/09/15 0229 06/10/15 0455 06/11/15 0520  NA 139 140 141  K 4.0 4.0 3.7  CL 104 105 106  CO2 23 23 26   GLUCOSE 131* 140* 105*  BUN 11 7 7   CREATININE 0.66 0.65 0.74  CALCIUM 9.1 9.4 8.8*   LFT  Recent Labs  06/09/15 0229  PROT 7.5  ALBUMIN 4.4  AST 18  ALT 17  ALKPHOS 76  BILITOT 0.4   Dg Chest Port 1 View  06/10/2015  CLINICAL DATA:  Patient status post colonoscopy. Possible aspiration. EXAM: PORTABLE CHEST 1 VIEW COMPARISON:  Chest radiograph 03/11/2015. FINDINGS: Monitoring leads overlie the patient. Stable cardiac and mediastinal contours. Interval development of heterogeneous opacities predominately within the left lower lobe. No pleural effusion or pneumothorax. IMPRESSION: Heterogeneous opacities left lung base may  represent atelectasis, aspiration or infection. No pleural effusion or pneumothorax. Electronically Signed   By: Lovey Newcomer M.D.   On: 06/10/2015 14:10   Assessment / Plan: *44 year old male presenting with abdominal pain and diarrhea and possible colitis on CT.  Cdiff negative.  Colonoscopy 12/20 was unable to be completed past the mid-distal transverse colon due to friable mucosa and stricture; biopsies positive for adenocarcinoma.  Also 11 polyps removed just from left colon so they are running micro-satellite instability testing.  General surgery to see the patient today and they requested CT of the chest for staging so this has been ordered.  Oncology will likely need to see the patient as well once all final path results are in.  ? Need for genetic counseling.    LOS: 2 days   Alaija Ruble D.  06/11/2015, 9:14 AM  Pager number [phone]

## 2015-06-11 NOTE — Consult Note (Signed)
John Moon 02/23/1971  [id number].   Requesting MD: Dr. Kavitha Nandigam Chief Complaint/Reason for Consult: colon cancer with obstruction HPI: this is a 44 yo Asian male who has been in the [place].  He is otherwise healthy.  On Sunday he began having diffuse abdominal pain.  His brother translates for him, but some of his history is not complete due to translation issues.  He was admitted for having diarrhea, but he denies diarrhea to me except with his colonoscopy prep.  He denies nausea or vomiting. He states he was constipation for several days prior to coming into the hospital.  He denies fevers, chills, night sweats, or weight loss.  He denies any other symptoms except pain 2 days ago.  Upon arrival, he had a CT scan that revealed a possible malignancy in his colon.  GI was consulted and he underwent a colonoscopy yesterday.  He had multiple polyps noted throughout what was seen of his colon.  He also had an obstructing mass noted in his transverse colon.  This was biopsied and results reveal adenocarcinoma.  The other polyps are adenomas.  We have been asked to see him for consideration of colectomy.  ROS : Please see HPI, otherwise all other systems are negative  History reviewed. No pertinent family history.  History reviewed. No pertinent past medical history.  Past Surgical History  Procedure Laterality Date  . Colonoscopy with propofol N/A 06/10/2015    Procedure: COLONOSCOPY WITH PROPOFOL;  Surgeon: Kavitha Nandigam V, MD;  Location: [facility];  Service: Endoscopy;  Laterality: N/A;    Social History:  reports that he has been smoking Cigarettes.  He has been smoking about 1.00 pack per day. He has never used smokeless tobacco. He reports that he drinks about 4.2 oz of alcohol per week. He reports that he does not use illicit drugs.  Allergies: No Known Allergies  Medications Prior to Admission  Medication Sig Dispense Refill  . ciprofloxacin (CIPRO) 500 MG tablet  Take 1 tablet (500 mg total) by mouth 2 (two) times daily. One po bid x 10 days (Patient not taking: Reported on 06/09/2015) 20 tablet 0  . metroNIDAZOLE (FLAGYL) 500 MG tablet Take 1 tablet (500 mg total) by mouth 2 (two) times daily. One po bid x 10 days (Patient not taking: Reported on 06/09/2015) 20 tablet 0    Blood pressure 135/85, pulse 80, temperature 98.4 F (36.9 C), temperature source Oral, resp. rate 18, height 5' 8" (1.727 m), weight 58.968 kg (130 lb), SpO2 99 %. Physical Exam: General: pleasant, WD, WN Asian male who is laying in bed in NAD HEENT: head is normocephalic, atraumatic.  Sclera are noninjected.  PERRL.  Ears and nose without any masses or lesions.  Mouth is pink and moist Heart: regular, rate, and rhythm.  Normal s1,s2. No obvious murmurs, gallops, or rubs noted.  Palpable radial and pedal pulses bilaterally Lungs: CTAB, no wheezes, rhonchi, or rales noted.  Respiratory effort nonlabored Abd: soft, tender mostly on right side and upper abdomen, with some voluntary guarding, some bloating, +BS, no masses, hernias, or organomegaly MS: all 4 extremities are symmetrical with no cyanosis, clubbing, or edema. Skin: warm and dry with no masses, lesions, or rashes Psych: A&Ox3 with an appropriate affect.    Results for orders placed or performed during the hospital encounter of 06/09/15 (from the past 48 hour(s))  Basic metabolic panel     Status: Abnormal   Collection Time: 06/10/15  4:55 AM    Result Value Ref Range   Sodium 140 135 - 145 mmol/L   Potassium 4.0 3.5 - 5.1 mmol/L   Chloride 105 101 - 111 mmol/L   CO2 23 22 - 32 mmol/L   Glucose, Bld 140 (H) 65 - 99 mg/dL   BUN 7 6 - 20 mg/dL   Creatinine, Ser 0.65 0.61 - 1.24 mg/dL   Calcium 9.4 8.9 - 10.3 mg/dL   GFR calc non Af Amer >60 >60 mL/min   GFR calc Af Amer >60 >60 mL/min    Comment: (NOTE) The eGFR has been calculated using the CKD EPI equation. This calculation has not been validated in all clinical  situations. eGFR's persistently <60 mL/min signify possible Chronic Kidney Disease.    Anion gap 12 5 - 15  CBC     Status: Abnormal   Collection Time: 06/10/15  4:55 AM  Result Value Ref Range   WBC 19.6 (H) 4.0 - 10.5 K/uL   RBC 5.35 4.22 - 5.81 MIL/uL   Hemoglobin 16.4 13.0 - 17.0 g/dL   HCT 48.6 39.0 - 52.0 %   MCV 90.8 78.0 - 100.0 fL   MCH 30.7 26.0 - 34.0 pg   MCHC 33.7 30.0 - 36.0 g/dL   RDW 12.8 11.5 - 15.5 %   Platelets 261 150 - 400 K/uL  CBC     Status: Abnormal   Collection Time: 06/11/15  5:20 AM  Result Value Ref Range   WBC 13.8 (H) 4.0 - 10.5 K/uL   RBC 4.15 (L) 4.22 - 5.81 MIL/uL   Hemoglobin 12.7 (L) 13.0 - 17.0 g/dL    Comment: REPEATED TO VERIFY DELTA CHECK NOTED    HCT 38.5 (L) 39.0 - 52.0 %   MCV 92.8 78.0 - 100.0 fL   MCH 30.6 26.0 - 34.0 pg   MCHC 33.0 30.0 - 36.0 g/dL   RDW 13.0 11.5 - 15.5 %   Platelets 217 150 - 400 K/uL  Basic metabolic panel     Status: Abnormal   Collection Time: 06/11/15  5:20 AM  Result Value Ref Range   Sodium 141 135 - 145 mmol/L   Potassium 3.7 3.5 - 5.1 mmol/L   Chloride 106 101 - 111 mmol/L   CO2 26 22 - 32 mmol/L   Glucose, Bld 105 (H) 65 - 99 mg/dL   BUN 7 6 - 20 mg/dL   Creatinine, Ser 0.74 0.61 - 1.24 mg/dL   Calcium 8.8 (L) 8.9 - 10.3 mg/dL   GFR calc non Af Amer >60 >60 mL/min   GFR calc Af Amer >60 >60 mL/min    Comment: (NOTE) The eGFR has been calculated using the CKD EPI equation. This calculation has not been validated in all clinical situations. eGFR's persistently <60 mL/min signify possible Chronic Kidney Disease.    Anion gap 9 5 - 15  Magnesium     Status: None   Collection Time: 06/11/15  5:20 AM  Result Value Ref Range   Magnesium 1.9 1.7 - 2.4 mg/dL  C difficile quick scan w PCR reflex     Status: None   Collection Time: 06/11/15  8:55 AM  Result Value Ref Range   C Diff antigen NEGATIVE NEGATIVE   C Diff toxin NEGATIVE NEGATIVE   C Diff interpretation Negative for toxigenic C.  difficile    Ct Chest W Contrast  06/11/2015  CLINICAL DATA:  Restaging colon cancer. EXAM: CT CHEST WITH CONTRAST TECHNIQUE: Multidetector CT imaging of the chest was performed  during intravenous contrast administration. CONTRAST:  11m OMNIPAQUE IOHEXOL 300 MG/ML  SOLN COMPARISON:  CT abdomen pelvis 06/09/2015. FINDINGS: Mediastinum/Nodes: No pathologically enlarged mediastinal, hilar or axillary lymph nodes. Heart size normal. No pericardial effusion. Lungs/Pleura: Trace left pleural effusion. Trace paraseptal emphysema. Subsegmental atelectasis in the lower lobes, left greater than right. Peribronchovascular ground-glass nodularity in the left upper lobe (example series 5, image 23). Airway is unremarkable. Upper abdomen: Visualized portion of the liver is unremarkable. There may be vicarious excretion of contrast in the gallbladder. Visualized portions of the adrenal glands, kidneys, spleen, pancreas and stomach are grossly unremarkable. Dilated fluid-filled transverse colon with a mass at the junction of the transverse and descending colon (series 2, image 55). Small ascites. Musculoskeletal: No worrisome lytic or sclerotic lesions. IMPRESSION: 1. No evidence of metastatic disease in the chest. 2. Trace left pleural effusion with probable atelectasis in the left lower lobe. 3. Peribronchovascular ground-glass nodularity in the left upper lobe is likely infectious or inflammatory in etiology. 4. Fluid-filled and dilated transverse colon, indicative of obstruction related to a mass at the junction of the transverse and descending colon. Please refer to CT abdomen pelvis 06/09/2015 for further details. Electronically Signed   By: MLorin PicketM.D.   On: 06/11/2015 11:02   Dg Chest Port 1 View  06/10/2015  CLINICAL DATA:  Patient status post colonoscopy. Possible aspiration. EXAM: PORTABLE CHEST 1 VIEW COMPARISON:  Chest radiograph 03/11/2015. FINDINGS: Monitoring leads overlie the patient. Stable  cardiac and mediastinal contours. Interval development of heterogeneous opacities predominately within the left lower lobe. No pleural effusion or pneumothorax. IMPRESSION: Heterogeneous opacities left lung base may represent atelectasis, aspiration or infection. No pleural effusion or pneumothorax. Electronically Signed   By: DLovey NewcomerM.D.   On: 06/10/2015 14:10       Assessment/Plan 1. Partially obstructing colon adenocarcinoma -the patient has a colon cancer that will require resection.  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  I will need to discuss with Dr. TMarlou Starksabout our surgical approach, whether that be a typical resection vs total colectomy. -cont clear liquids for now -NPO p MN -check baseline CEA -I have discussed all of this with the patient and his brother.  They both understand and are agreeable. -thank you for this consult.  Tashiana Lamarca E 06/11/2015, 11:46 AM Pager: [phone]

## 2015-06-12 ENCOUNTER — Inpatient Hospital Stay (HOSPITAL_COMMUNITY): Payer: 59 | Admitting: Certified Registered Nurse Anesthetist

## 2015-06-12 ENCOUNTER — Encounter (HOSPITAL_COMMUNITY): Payer: Self-pay | Admitting: Certified Registered Nurse Anesthetist

## 2015-06-12 ENCOUNTER — Other Ambulatory Visit: Payer: Self-pay | Admitting: Oncology

## 2015-06-12 ENCOUNTER — Encounter (HOSPITAL_COMMUNITY)
Admission: EM | Disposition: A | Discharge status: Home / Self Care | Payer: Self-pay | Source: Home / Self Care | Attending: Internal Medicine

## 2015-06-12 HISTORY — PX: LAPAROSCOPIC PARTIAL COLECTOMY: SHX5907

## 2015-06-12 HISTORY — PX: PARTIAL COLECTOMY: SHX5273

## 2015-06-12 LAB — TYPE AND SCREEN
ABO/RH(D): B POS
Antibody Screen: NEGATIVE

## 2015-06-12 LAB — ABO/RH: ABO/RH(D): B POS

## 2015-06-12 LAB — CEA: CEA: 26.3 ng/mL — ABNORMAL HIGH (ref 0.0–4.7)

## 2015-06-12 LAB — SURGICAL PCR SCREEN
MRSA, PCR: NEGATIVE
Staphylococcus aureus: NEGATIVE

## 2015-06-12 SURGERY — LAPAROSCOPIC PARTIAL COLECTOMY
Anesthesia: General

## 2015-06-12 MED ORDER — PROPOFOL 10 MG/ML IV BOLUS
INTRAVENOUS | Status: DC | PRN
  Administered 2015-06-12: 150 mg via INTRAVENOUS
  Administered 2015-06-12: 50 mg via INTRAVENOUS

## 2015-06-12 MED ORDER — FENTANYL CITRATE (PF) 100 MCG/2ML IJ SOLN
INTRAMUSCULAR | Status: AC
  Filled 2015-06-12: qty 2

## 2015-06-12 MED ORDER — ROCURONIUM BROMIDE 100 MG/10ML IV SOLN
INTRAVENOUS | Status: DC | PRN
  Administered 2015-06-12 (×3): 10 mg via INTRAVENOUS
  Administered 2015-06-12: 30 mg via INTRAVENOUS

## 2015-06-12 MED ORDER — LIDOCAINE HCL (CARDIAC) 20 MG/ML IV SOLN
INTRAVENOUS | Status: AC
  Filled 2015-06-12: qty 5

## 2015-06-12 MED ORDER — SUGAMMADEX SODIUM 200 MG/2ML IV SOLN
INTRAVENOUS | Status: AC
  Filled 2015-06-12: qty 2

## 2015-06-12 MED ORDER — LABETALOL HCL 5 MG/ML IV SOLN
5.0000 mg | INTRAVENOUS | Status: AC | PRN
  Administered 2015-06-12 (×2): 5 mg via INTRAVENOUS

## 2015-06-12 MED ORDER — PANTOPRAZOLE SODIUM 40 MG IV SOLR
40.0000 mg | Freq: Every day | INTRAVENOUS | Status: DC
  Administered 2015-06-12 – 2015-06-16 (×5): 40 mg via INTRAVENOUS
  Filled 2015-06-12 (×6): qty 40

## 2015-06-12 MED ORDER — MIDAZOLAM HCL 5 MG/5ML IJ SOLN
INTRAMUSCULAR | Status: DC | PRN
  Administered 2015-06-12 (×2): 1 mg via INTRAVENOUS

## 2015-06-12 MED ORDER — BUPIVACAINE-EPINEPHRINE 0.25% -1:200000 IJ SOLN
INTRAMUSCULAR | Status: DC | PRN
  Administered 2015-06-12: 20 mL

## 2015-06-12 MED ORDER — ONDANSETRON 4 MG PO TBDP: 4.0000 mg | ORAL_TABLET | Freq: Four times a day (QID) | ORAL | Status: DC | PRN

## 2015-06-12 MED ORDER — MIDAZOLAM HCL 2 MG/2ML IJ SOLN
INTRAMUSCULAR | Status: AC
  Filled 2015-06-12: qty 2

## 2015-06-12 MED ORDER — ONDANSETRON HCL 4 MG/2ML IJ SOLN
INTRAMUSCULAR | Status: AC
  Filled 2015-06-12: qty 2

## 2015-06-12 MED ORDER — LACTATED RINGERS IV SOLN
INTRAVENOUS | Status: DC | PRN
Start: 2015-06-12 — End: 2015-06-12
  Administered 2015-06-12 (×2): via INTRAVENOUS

## 2015-06-12 MED ORDER — FENTANYL CITRATE (PF) 100 MCG/2ML IJ SOLN
INTRAMUSCULAR | Status: DC | PRN
  Administered 2015-06-12 (×3): 50 ug via INTRAVENOUS
  Administered 2015-06-12: 100 ug via INTRAVENOUS
  Administered 2015-06-12 (×4): 50 ug via INTRAVENOUS

## 2015-06-12 MED ORDER — HEPARIN SODIUM (PORCINE) 5000 UNIT/ML IJ SOLN
5000.0000 [IU] | Freq: Three times a day (TID) | INTRAMUSCULAR | Status: DC
  Administered 2015-06-13 – 2015-06-17 (×10): 5000 [IU] via SUBCUTANEOUS
  Filled 2015-06-12 (×15): qty 1

## 2015-06-12 MED ORDER — SODIUM CHLORIDE 0.9 % IV SOLN
INTRAVENOUS | Status: DC
  Administered 2015-06-12 – 2015-06-16 (×6): via INTRAVENOUS

## 2015-06-12 MED ORDER — LIDOCAINE HCL (CARDIAC) 20 MG/ML IV SOLN
INTRAVENOUS | Status: DC | PRN
  Administered 2015-06-12: 75 mg via INTRAVENOUS

## 2015-06-12 MED ORDER — MORPHINE SULFATE (PF) 2 MG/ML IV SOLN
1.0000 mg | INTRAVENOUS | Status: DC | PRN
  Administered 2015-06-12: 4 mg via INTRAVENOUS
  Administered 2015-06-12: 2 mg via INTRAVENOUS
  Administered 2015-06-12: 3 mg via INTRAVENOUS
  Administered 2015-06-13 (×2): 4 mg via INTRAVENOUS
  Administered 2015-06-13: 2 mg via INTRAVENOUS
  Administered 2015-06-13 (×2): 4 mg via INTRAVENOUS
  Administered 2015-06-13: 2 mg via INTRAVENOUS
  Administered 2015-06-13 – 2015-06-14 (×4): 4 mg via INTRAVENOUS
  Administered 2015-06-14: 2 mg via INTRAVENOUS
  Administered 2015-06-15 – 2015-06-16 (×6): 4 mg via INTRAVENOUS
  Filled 2015-06-12 (×2): qty 2
  Filled 2015-06-12: qty 1
  Filled 2015-06-12: qty 2
  Filled 2015-06-12: qty 1
  Filled 2015-06-12 (×2): qty 2
  Filled 2015-06-12: qty 1
  Filled 2015-06-12 (×5): qty 2
  Filled 2015-06-12: qty 1
  Filled 2015-06-12 (×9): qty 2

## 2015-06-12 MED ORDER — ROCURONIUM BROMIDE 100 MG/10ML IV SOLN
INTRAVENOUS | Status: AC
  Filled 2015-06-12: qty 1

## 2015-06-12 MED ORDER — HYDROMORPHONE HCL 1 MG/ML IJ SOLN
INTRAMUSCULAR | Status: AC
  Filled 2015-06-12: qty 1

## 2015-06-12 MED ORDER — SUCCINYLCHOLINE CHLORIDE 20 MG/ML IJ SOLN
INTRAMUSCULAR | Status: DC | PRN
  Administered 2015-06-12: 100 mg via INTRAVENOUS

## 2015-06-12 MED ORDER — ONDANSETRON HCL 4 MG/2ML IJ SOLN
4.0000 mg | Freq: Four times a day (QID) | INTRAMUSCULAR | Status: DC | PRN
  Administered 2015-06-14 – 2015-06-17 (×3): 4 mg via INTRAVENOUS
  Filled 2015-06-12: qty 2

## 2015-06-12 MED ORDER — CEFOTETAN DISODIUM-DEXTROSE 2-2.08 GM-% IV SOLR
INTRAVENOUS | Status: AC
  Filled 2015-06-12: qty 50

## 2015-06-12 MED ORDER — SUGAMMADEX SODIUM 500 MG/5ML IV SOLN
INTRAVENOUS | Status: DC | PRN
  Administered 2015-06-12: 200 mg via INTRAVENOUS

## 2015-06-12 MED ORDER — LABETALOL HCL 5 MG/ML IV SOLN
INTRAVENOUS | Status: AC
  Administered 2015-06-12: 5 mg via INTRAVENOUS
  Filled 2015-06-12: qty 4

## 2015-06-12 MED ORDER — LACTATED RINGERS IV SOLN
INTRAVENOUS | Status: DC
  Administered 2015-06-12: 1000 mL via INTRAVENOUS

## 2015-06-12 MED ORDER — PROPOFOL 10 MG/ML IV BOLUS
INTRAVENOUS | Status: AC
  Filled 2015-06-12: qty 20

## 2015-06-12 MED ORDER — PROMETHAZINE HCL 25 MG/ML IJ SOLN: 6.2500 mg | INTRAMUSCULAR | Status: DC | PRN

## 2015-06-12 MED ORDER — DEXTROSE 5 % IV SOLN
500.0000 mg | Freq: Three times a day (TID) | INTRAVENOUS | Status: DC | PRN
  Administered 2015-06-12: 500 mg via INTRAVENOUS
  Filled 2015-06-12 (×2): qty 5

## 2015-06-12 MED ORDER — ACETAMINOPHEN 10 MG/ML IV SOLN
INTRAVENOUS | Status: DC | PRN
  Administered 2015-06-12: 1000 mg via INTRAVENOUS

## 2015-06-12 MED ORDER — FENTANYL CITRATE (PF) 250 MCG/5ML IJ SOLN
INTRAMUSCULAR | Status: AC
  Filled 2015-06-12: qty 5

## 2015-06-12 MED ORDER — BUPIVACAINE-EPINEPHRINE (PF) 0.25% -1:200000 IJ SOLN
INTRAMUSCULAR | Status: AC
  Filled 2015-06-12: qty 30

## 2015-06-12 MED ORDER — LABETALOL HCL 5 MG/ML IV SOLN
INTRAVENOUS | Status: DC | PRN
  Administered 2015-06-12 (×2): 2.5 mg via INTRAVENOUS
  Administered 2015-06-12 (×2): 5 mg via INTRAVENOUS

## 2015-06-12 MED ORDER — ACETAMINOPHEN 10 MG/ML IV SOLN
INTRAVENOUS | Status: AC
  Filled 2015-06-12: qty 100

## 2015-06-12 MED ORDER — HYDROMORPHONE HCL 1 MG/ML IJ SOLN
0.2500 mg | INTRAMUSCULAR | Status: DC | PRN
  Administered 2015-06-12 (×4): 0.5 mg via INTRAVENOUS

## 2015-06-12 MED ORDER — ONDANSETRON HCL 4 MG/2ML IJ SOLN
INTRAMUSCULAR | Status: DC | PRN
Start: 2015-06-12 — End: 2015-06-12
  Administered 2015-06-12 (×2): 2 mg via INTRAVENOUS

## 2015-06-12 MED ORDER — EPHEDRINE SULFATE 50 MG/ML IJ SOLN
INTRAMUSCULAR | Status: DC | PRN
  Administered 2015-06-12: 5 mg via INTRAVENOUS

## 2015-06-12 MED ORDER — LACTATED RINGERS IV SOLN
INTRAVENOUS | Status: DC | PRN
  Administered 2015-06-12 (×3): via INTRAVENOUS

## 2015-06-12 MED ORDER — HYDROMORPHONE HCL 1 MG/ML IJ SOLN
INTRAMUSCULAR | Status: AC
  Administered 2015-06-12: 0.5 mg via INTRAVENOUS
  Filled 2015-06-12: qty 1

## 2015-06-12 MED ORDER — GLYCOPYRROLATE 0.2 MG/ML IJ SOLN
INTRAMUSCULAR | Status: AC
  Filled 2015-06-12: qty 3

## 2015-06-12 MED ORDER — DEXAMETHASONE SODIUM PHOSPHATE 10 MG/ML IJ SOLN
INTRAMUSCULAR | Status: AC
  Filled 2015-06-12: qty 1

## 2015-06-12 SURGICAL SUPPLY — 77 items
APPLIER CLIP 5 13 M/L LIGAMAX5 (MISCELLANEOUS)
APPLIER CLIP ROT 10 11.4 M/L (STAPLE)
APR CLP MED LRG 11.4X10 (STAPLE)
APR CLP MED LRG 5 ANG JAW (MISCELLANEOUS)
BLADE EXTENDED COATED 6.5IN (ELECTRODE) IMPLANT
BLADE HEX COATED 2.75 (ELECTRODE) ×4 IMPLANT
BLADE SURG SZ10 CARB STEEL (BLADE) ×2 IMPLANT
CELLS DAT CNTRL 66122 CELL SVR (MISCELLANEOUS) ×2 IMPLANT
CLAMP ENDO BABCK 10MM (STAPLE) IMPLANT
CLIP APPLIE 5 13 M/L LIGAMAX5 (MISCELLANEOUS) IMPLANT
CLIP APPLIE ROT 10 11.4 M/L (STAPLE) IMPLANT
CLOSURE WOUND 1/2 X4 (GAUZE/BANDAGES/DRESSINGS)
CONNECTOR 5 IN 1 STRAIGHT STRL (MISCELLANEOUS) IMPLANT
COVER MAYO STAND STRL (DRAPES) ×6 IMPLANT
COVER SURGICAL LIGHT HANDLE (MISCELLANEOUS) ×8 IMPLANT
DECANTER SPIKE VIAL GLASS SM (MISCELLANEOUS) ×4 IMPLANT
DEVICE TROCAR PUNCTURE CLOSURE (ENDOMECHANICALS) IMPLANT
DRAPE LAPAROSCOPIC ABDOMINAL (DRAPES) ×4 IMPLANT
DRAPE SHEET LG 3/4 BI-LAMINATE (DRAPES) ×2 IMPLANT
DRAPE WARM FLUID 44X44 (DRAPE) ×4 IMPLANT
DRSG MEPILEX BORDER 4X8 (GAUZE/BANDAGES/DRESSINGS) ×2 IMPLANT
ELECT PENCIL ROCKER SW 15FT (MISCELLANEOUS) ×4 IMPLANT
ELECT REM PT RETURN 9FT ADLT (ELECTROSURGICAL) ×4
ELECTRODE REM PT RTRN 9FT ADLT (ELECTROSURGICAL) ×2 IMPLANT
ENSEAL DEVICE STD TIP 35CM (ENDOMECHANICALS) IMPLANT
GAUZE SPONGE 4X4 12PLY STRL (GAUZE/BANDAGES/DRESSINGS) ×4 IMPLANT
GLOVE BIOGEL PI IND STRL 7.0 (GLOVE) ×2 IMPLANT
GLOVE BIOGEL PI INDICATOR 7.0 (GLOVE) ×12
GOWN STRL REUS W/TWL LRG LVL3 (GOWN DISPOSABLE) ×10 IMPLANT
GOWN STRL REUS W/TWL XL LVL3 (GOWN DISPOSABLE) ×12 IMPLANT
KIT BASIN OR (CUSTOM PROCEDURE TRAY) ×6 IMPLANT
LEGGING LITHOTOMY PAIR STRL (DRAPES) IMPLANT
LIGASURE IMPACT 36 18CM CVD LR (INSTRUMENTS) ×4 IMPLANT
NS IRRIG 1000ML POUR BTL (IV SOLUTION) ×8 IMPLANT
RELOAD PROXIMATE 75MM BLUE (ENDOMECHANICALS) ×12 IMPLANT
RELOAD STAPLE 75 3.8 BLU REG (ENDOMECHANICALS) IMPLANT
RETRACTOR WND ALEXIS 18 MED (MISCELLANEOUS) IMPLANT
RTRCTR WOUND ALEXIS 18CM MED (MISCELLANEOUS) ×4
SCISSORS LAP 5X35 DISP (ENDOMECHANICALS) ×2 IMPLANT
SET IRRIG TUBING LAPAROSCOPIC (IRRIGATION / IRRIGATOR) IMPLANT
SHEARS HARMONIC ACE PLUS 36CM (ENDOMECHANICALS) ×4 IMPLANT
SLEEVE XCEL OPT CAN 5 100 (ENDOMECHANICALS) ×2 IMPLANT
SOLUTION ANTI FOG 6CC (MISCELLANEOUS) ×4 IMPLANT
STAPLER GUN LINEAR PROX 60 (STAPLE) ×2 IMPLANT
STAPLER PROXIMATE 75MM BLUE (STAPLE) ×2 IMPLANT
STAPLER VISISTAT 35W (STAPLE) ×4 IMPLANT
STRIP CLOSURE SKIN 1/2X4 (GAUZE/BANDAGES/DRESSINGS) IMPLANT
SUCTION POOLE TIP (SUCTIONS) ×2 IMPLANT
SUT PDS AB 1 CT1 27 (SUTURE) IMPLANT
SUT PDS AB 1 CTX 36 (SUTURE) IMPLANT
SUT PDS AB 4-0 SH 27 (SUTURE) IMPLANT
SUT PROLENE 2 0 KS (SUTURE) IMPLANT
SUT SILK 2 0 (SUTURE) ×4
SUT SILK 2 0 SH CR/8 (SUTURE) ×4 IMPLANT
SUT SILK 2-0 18XBRD TIE 12 (SUTURE) ×2 IMPLANT
SUT SILK 3 0 (SUTURE) ×4
SUT SILK 3 0 SH CR/8 (SUTURE) ×8 IMPLANT
SUT SILK 3-0 18XBRD TIE 12 (SUTURE) ×2 IMPLANT
SUT VIC AB 2-0 CT1 27 (SUTURE)
SUT VIC AB 2-0 CT1 27XBRD (SUTURE) IMPLANT
SUT VIC AB 3-0 PS2 18 (SUTURE)
SUT VIC AB 3-0 PS2 18XBRD (SUTURE) IMPLANT
SUT VIC AB 4-0 SH 18 (SUTURE) IMPLANT
SUT VICRYL 0 UR6 27IN ABS (SUTURE) IMPLANT
SYR 30ML LL (SYRINGE) ×4 IMPLANT
SYR BULB IRRIGATION 50ML (SYRINGE) ×4 IMPLANT
TRAY FOLEY W/METER SILVER 14FR (SET/KITS/TRAYS/PACK) ×2 IMPLANT
TRAY FOLEY W/METER SILVER 16FR (SET/KITS/TRAYS/PACK) ×4 IMPLANT
TRAY LAPAROSCOPIC (CUSTOM PROCEDURE TRAY) ×4 IMPLANT
TROCAR BLADELESS OPT 5 100 (ENDOMECHANICALS) IMPLANT
TROCAR XCEL NON-BLD 11X100MML (ENDOMECHANICALS) IMPLANT
TROCAR XCEL UNIV SLVE 11M 100M (ENDOMECHANICALS) IMPLANT
TUBING CONNECTING 10 (TUBING) ×1 IMPLANT
TUBING CONNECTING 10' (TUBING) ×1
TUBING FILTER THERMOFLATOR (ELECTROSURGICAL) ×4 IMPLANT
YANKAUER SUCT BULB TIP 10FT TU (MISCELLANEOUS) ×4 IMPLANT
YANKAUER SUCT BULB TIP NO VENT (SUCTIONS) ×4 IMPLANT

## 2015-06-12 NOTE — Op Note (Signed)
06/09/2015 - 06/12/2015  2:28 PM  PATIENT:  John Moon  44 y.o. male  PRE-OPERATIVE DIAGNOSIS:  COLON CANCER  POST-OPERATIVE DIAGNOSIS:  Transverse COLON CANCER  PROCEDURE:  Procedure(s): LAPAROSCOPIC ASSISTED  Right and transverse COLECTOMY with mobilization of splenic flexure and primary small bowel to left colon anastamosis  SURGEON:  Surgeon(s) and Role:    * Jovita Kussmaul, MD - Primary  PHYSICIAN ASSISTANT:   ASSISTANTS: Saverio Danker, PA   ANESTHESIA:   general  EBL:  Total I/O In: [id number] [I.V.:2300; IV Piggyback:50] Out: 525 [Urine:400; Other:100; Blood:25]  BLOOD ADMINISTERED:none  DRAINS: none   LOCAL MEDICATIONS USED:  MARCAINE     SPECIMEN:  Source of Specimen:  right and transverse colon  DISPOSITION OF SPECIMEN:  PATHOLOGY  COUNTS:  YES  TOURNIQUET:  * No tourniquets in log *  DICTATION: .Dragon Dictation   After informed consent was obtained the patient was brought to the operating room and placed in the supine position on the operating room table. After adequate induction of general anesthesia the patient's abdomen was prepped with ChloraPrep, allowed to dry, and draped in usual sterile manner. The area below the umbilicus was infiltrated with quarter percent Marcaine. A small incision was made with a kidney blade knife. The incision was carried to the subcutaneous tissue bluntly with a hemostat and [employer] retractors until the linea alba was identified. The linea alba was incised with the 15 blade knife and each side was grasped  With Coker clamps and elevated anteriorly. The preperitoneal space was probed bluntly with a hemostat until the peritoneum was opened and excised into the abdominal cavity. A 0 Vicryl pursestring stitch was placed in the fascia surrounding the opening. The Hassan cannula was placed through the opening and anchored in place with the previously placed Vicryl pursestring stitch. The abdomen was then insufflated with carbon dioxide  without difficulty. A camera was placed through the [facility] cannula and the abdomen was inspected. 2 sites were chosen for 5 mm ports. These areas were infiltrated with quarter percent Marcaine. Small stab incisions were made with a 15 blade knife. 5 mm ports were placed bluntly through these incisions in the right upper quadrant and left lower quadrant under direct vision. The camera was then moved to the right lower quadrant. The left colon and splenic flexure were mobilized sharply with the Harmonic scalpel by incising the retroperitoneal attachments along the white line of Toldt. The area of the cancer was readily identified along the midportion of the transverse colon. At this point I decided to open the abdomen with an upper midline incision. A Balfour retractor was deployed. Next we entered the lesser sac between the stomach and transverse colon and mobilize the rest of the transverse colon. On inspecting the rest of the colon and the right colon had a large serosal tear with significant bruising of the  Colon wall. This area did not appear to be healthy enough to anastomose to. We did decide to try to do a primary anastomosis because he had actually tolerated a bowel prep and is colon looked fairly well cleaned out. We then mobilized the right colon also by incising the retroperitoneal attachments along the white line of Toldt. We then divided the terminal ileum with a GIA-75 stapler. The mesentery to the right and transverse colon was taken down sharply with the LigaSure. The major vessels were clamped with Kelly clamps divided and ligated with 2-0 silk suture ligatures. Once this was accomplished the right and  transverse colons were removed and sent to pathology for further evaluation. The terminal ileum readily reached the left colon without any tension. A small opening was made on the antimesenteric border of each limb of the terminal ileum and left colon. Each limb of a GIA 75 stapler was then placed  down the appropriate limb of small bowel or colon, clamped, and fired thereby creating a nice widely patent enteroenterostomy. The common opening was closed with a TA 60 stapler. The staple line was imbricated with interrupted 2-0 silk stitches. A 2-0 silk crotch stitch was also placed. Once this was accomplished the anastomosis appeared to be healthy and widely patent with no tension. The abdomen was then irrigated with copious amounts of saline. At this point all gowns and gloves were changed and new clean drapes were placed around the wound. The abdomen was generally inspected and no other abnormalities were noted. The liver was clean. The NG tube was in appropriate position. The fascia of the anterior abdominal wall was then closed with 2 running #1 double-stranded loop PDS sutures. The ports were all removed. The subcutaneous tissue was irrigated with copious amounts of saline and the skin incisions were closed with staples. Sterile dressings were applied. The patient tolerated the procedure well. At the end of the case all needle sponge and instrument counts were correct. The patient was then awakened and taken to recovery in stable condition.  PLAN OF CARE: Admit to inpatient   PATIENT DISPOSITION:  PACU - hemodynamically stable.   Delay start of Pharmacological VTE agent (>24hrs) due to surgical blood loss or risk of bleeding: no

## 2015-06-12 NOTE — Anesthesia Postprocedure Evaluation (Signed)
Anesthesia Post Note  Patient: John Moon  Procedure(s) Performed: Procedure(s) (LRB): LAPAROSCOPIC ASSISTED  PARTIAL COLECTOMY (N/A) PARTIAL COLECTOMY  Patient location during evaluation: PACU Anesthesia Type: General Level of consciousness: awake and alert Pain management: pain level controlled Vital Signs Assessment: post-procedure vital signs reviewed and stable Respiratory status: spontaneous breathing, nonlabored ventilation and respiratory function stable Cardiovascular status: stable (blood pressure is elevated, he has otherwise stable vital signs and expresses no pain, denies chest pain or headache, will treat with 10mg  labetalol) Postop Assessment: no signs of nausea or vomiting Anesthetic complications: no    Last Vitals:  Filed Vitals:   06/12/15 1454 06/12/15 1455  BP: 167/102 165/102  Pulse:    Temp:    Resp:      Last Pain:  Filed Vitals:   06/12/15 1459  PainSc: 0-No pain                 Zenaida Deed

## 2015-06-12 NOTE — Progress Notes (Signed)
Date: June 12, 2015 Chart reviewed for concurrent status and case management needs. Will continue to follow patient for changes and needs: colon resection today Velva Harman, RN, BSN, CCM   [phone]

## 2015-06-12 NOTE — Progress Notes (Signed)
[facility] PROGRESS NOTE  VALIANT SINGLER [id number] DOB: May 12, 1971 DOA: 06/09/2015 PCP: No primary care provider on file.  HPI/Brief narrative John Moon today has, No headache, No chest pain, still has some diarrhea and generalized abdominal pain - No Nausea, No new weakness tingling or numbness, No Cough - SOB  Assessment/Plan: 1. Colonic adenocarcinoma  -Patient status post colonoscopy on 06/10/2015. Patient noted to have multiple polyps with mass noted. Biopsy of mass is noted to be positive for invasive adenocarcinoma -Gen. surgery consulted and patient has since been taken to the OR for surgery this morning -I have since discussed the case with oncology, who will arrange for close follow-up for this patient  2. History of smoking. Counseled to quit.  3. Hypertension.  -Blood pressure currently well-controlled.  4. DVT prophylaxis -SCDs  Code Status: Full Family Communication: Pt in room, family at bedside Disposition Plan: Home when cleared by GI and Surgery   Consultants:  General surgery  Gastroenterology  Oncology  Procedures:  Colonoscopy 06/10/2015  Surgery 06/12/2015  Antibiotics: Anti-infectives    Start     Dose/Rate Route Frequency Ordered Stop   06/12/15 0600  cefoTEtan (CEFOTAN) 2 g in dextrose 5 % 50 mL IVPB     2 g 100 mL/hr over 30 Minutes Intravenous 30 min pre-op 06/11/15 1237 06/12/15 1115   06/09/15 0830  metroNIDAZOLE (FLAGYL) tablet 500 mg  Status:  Discontinued     500 mg Oral 3 times per day 06/09/15 0825 06/11/15 1011   06/09/15 0230  ciprofloxacin (CIPRO) IVPB 400 mg     400 mg 200 mL/hr over 60 Minutes Intravenous  Once 06/09/15 0222 06/09/15 0345   06/09/15 0230  metroNIDAZOLE (FLAGYL) IVPB 500 mg     500 mg 100 mL/hr over 60 Minutes Intravenous  Once 06/09/15 0222 06/09/15 0603      HPI/Subjective: Patient is in good spirits this morning and is without complaints. In fact, he states he feels somewhat better  today  Objective: Filed Vitals:   06/11/15 0652 06/11/15 1514 06/11/15 2137 06/12/15 0625  BP: 135/85 145/85 127/79 128/77  Pulse: 80 83 72 66  Temp: 98.4 F (36.9 C) 99.4 F (37.4 C) 98.2 F (36.8 C) 98.3 F (36.8 C)  TempSrc: Oral Oral Oral Oral  Resp:  16 16 16   Height:      Weight:      SpO2: 99% 97% 97% 97%    Intake/Output Summary (Last 24 hours) at 06/12/15 1252 Last data filed at 06/12/15 1145  Gross per 24 hour  Intake   1000 ml  Output    175 ml  Net    825 ml   Filed Weights   06/09/15 0908 06/10/15 0545 06/10/15 1151  Weight: 59.557 kg (131 lb 4.8 oz) 59.1 kg (130 lb 4.7 oz) 58.968 kg (130 lb)    Exam:   General:  Awake, in nad, lying in bed  Cardiovascular: regular, s1, s2  Respiratory: normal resp effort, no wheezing  Abdomen: soft,nondistended, nontender  Musculoskeletal: perfused, no clubbing, no cyanosis  Data Reviewed: Basic Metabolic Panel:  Recent Labs Lab 06/09/15 0229 06/10/15 0455 06/11/15 0520  NA 139 140 141  K 4.0 4.0 3.7  CL 104 105 106  CO2 23 23 26   GLUCOSE 131* 140* 105*  BUN 11 7 7   CREATININE 0.66 0.65 0.74  CALCIUM 9.1 9.4 8.8*  MG  --   --  1.9   Liver Function Tests:  Recent Labs Lab 06/09/15 0229  AST 18  ALT 17  ALKPHOS 76  BILITOT 0.4  PROT 7.5  ALBUMIN 4.4    Recent Labs Lab 06/09/15 0229  LIPASE 156*   No results for input(s): AMMONIA in the last 168 hours. CBC:  Recent Labs Lab 06/09/15 0229 06/10/15 0455 06/11/15 0520  WBC 12.6* 19.6* 13.8*  HGB 15.2 16.4 12.7*  HCT 45.2 48.6 38.5*  MCV 92.2 90.8 92.8  PLT 227 261 217   Cardiac Enzymes: No results for input(s): CKTOTAL, CKMB, CKMBINDEX, TROPONINI in the last 168 hours. BNP (last 3 results) No results for input(s): BNP in the last 8760 hours.  ProBNP (last 3 results) No results for input(s): PROBNP in the last 8760 hours.  CBG: No results for input(s): GLUCAP in the last 168 hours.  Recent Results (from the past 240  hour(s))  C difficile quick scan w PCR reflex     Status: None   Collection Time: 06/11/15  8:55 AM  Result Value Ref Range Status   C Diff antigen NEGATIVE NEGATIVE Final   C Diff toxin NEGATIVE NEGATIVE Final   C Diff interpretation Negative for toxigenic C. difficile  Final  Surgical pcr screen     Status: None   Collection Time: 06/11/15 10:44 PM  Result Value Ref Range Status   MRSA, PCR NEGATIVE NEGATIVE Final   Staphylococcus aureus NEGATIVE NEGATIVE Final    Comment:        The Xpert SA Assay (FDA approved for NASAL specimens in patients over 3 years of age), is one component of a comprehensive surveillance program.  Test performance has been validated by [facility] for patients greater than or equal to 14 year old. It is not intended to diagnose infection nor to guide or monitor treatment.      Studies: Ct Chest W Contrast  06/11/2015  CLINICAL DATA:  Restaging colon cancer. EXAM: CT CHEST WITH CONTRAST TECHNIQUE: Multidetector CT imaging of the chest was performed during intravenous contrast administration. CONTRAST:  47mL OMNIPAQUE IOHEXOL 300 MG/ML  SOLN COMPARISON:  CT abdomen pelvis 06/09/2015. FINDINGS: Mediastinum/Nodes: No pathologically enlarged mediastinal, hilar or axillary lymph nodes. Heart size normal. No pericardial effusion. Lungs/Pleura: Trace left pleural effusion. Trace paraseptal emphysema. Subsegmental atelectasis in the lower lobes, left greater than right. Peribronchovascular ground-glass nodularity in the left upper lobe (example series 5, image 23). Airway is unremarkable. Upper abdomen: Visualized portion of the liver is unremarkable. There may be vicarious excretion of contrast in the gallbladder. Visualized portions of the adrenal glands, kidneys, spleen, pancreas and stomach are grossly unremarkable. Dilated fluid-filled transverse colon with a mass at the junction of the transverse and descending colon (series 2, image 55). Small ascites.  Musculoskeletal: No worrisome lytic or sclerotic lesions. IMPRESSION: 1. No evidence of metastatic disease in the chest. 2. Trace left pleural effusion with probable atelectasis in the left lower lobe. 3. Peribronchovascular ground-glass nodularity in the left upper lobe is likely infectious or inflammatory in etiology. 4. Fluid-filled and dilated transverse colon, indicative of obstruction related to a mass at the junction of the transverse and descending colon. Please refer to CT abdomen pelvis 06/09/2015 for further details. Electronically Signed   By: Lorin Picket M.D.   On: 06/11/2015 11:02   Dg Chest Port 1 View  06/10/2015  CLINICAL DATA:  Patient status post colonoscopy. Possible aspiration. EXAM: PORTABLE CHEST 1 VIEW COMPARISON:  Chest radiograph 03/11/2015. FINDINGS: Monitoring leads overlie the patient. Stable cardiac and mediastinal contours. Interval development of heterogeneous opacities predominately  within the left lower lobe. No pleural effusion or pneumothorax. IMPRESSION: Heterogeneous opacities left lung base may represent atelectasis, aspiration or infection. No pleural effusion or pneumothorax. Electronically Signed   By: Lovey Newcomer M.D.   On: 06/10/2015 14:10    Scheduled Meds: . [MAR Hold] ondansetron (ZOFRAN) IV  4 mg Intravenous 4 times per day   Continuous Infusions: . lactated ringers 1,000 mL (06/12/15 1030)    Active Problems:   Colitis   Colonic mass   Benign neoplasm of colon   Colon cancer ([place])   Aspiration into airway    Kolden Dupee K  [facility] Pager [phone]. If 7PM-7AM, please contact night-coverage at [email or web address], password [facility] 06/12/2015, 12:52 PM  LOS: 3 days

## 2015-06-12 NOTE — Transfer of Care (Signed)
Immediate Anesthesia Transfer of Care Note  Patient: John Moon  Procedure(s) Performed: Procedure(s): LAPAROSCOPIC ASSISTED  PARTIAL COLECTOMY (N/A) PARTIAL COLECTOMY  Patient Location: PACU  Anesthesia Type:General  Level of Consciousness: awake, oriented, patient cooperative, lethargic and responds to stimulation  Airway & Oxygen Therapy: Patient Spontanous Breathing and Patient connected to face mask oxygen  Post-op Assessment: Report given to RN, Post -op Vital signs reviewed and stable and Patient moving all extremities  Post vital signs: Reviewed and stable  Last Vitals:  Filed Vitals:   06/12/15 0625 06/12/15 1418  BP: 128/77 179/99  Pulse: 66 77  Temp: 36.8 C 36.6 C  Resp: 16 15    Complications: No apparent anesthesia complications

## 2015-06-12 NOTE — Anesthesia Preprocedure Evaluation (Signed)
Anesthesia Evaluation  Patient identified by MRN, date of birth, ID band Patient awake    Reviewed: Allergy & Precautions, NPO status , Patient's Chart, lab work & pertinent test results  History of Anesthesia Complications Negative for: history of anesthetic complications  Airway Mallampati: II  TM Distance: >3 FB Neck ROM: Full    Dental no notable dental hx.    Pulmonary Current Smoker,    Pulmonary exam normal breath sounds clear to auscultation       Cardiovascular negative cardio ROS Normal cardiovascular exam Rhythm:Regular Rate:Normal     Neuro/Psych negative neurological ROS  negative psych ROS   GI/Hepatic negative GI ROS, Neg liver ROS,   Endo/Other  negative endocrine ROS  Renal/GU negative Renal ROS  negative genitourinary   Musculoskeletal negative musculoskeletal ROS (+)   Abdominal   Peds negative pediatric ROS (+)  Hematology negative hematology ROS (+)   Anesthesia Other Findings   Reproductive/Obstetrics negative OB ROS                             Anesthesia Physical  Anesthesia Plan  ASA: III  Anesthesia Plan: General   Post-op Pain Management:    Induction: Intravenous  Airway Management Planned: Oral ETT  Additional Equipment: None  Intra-op Plan:   Post-operative Plan: Extubation in OR  Informed Consent: I have reviewed the patients History and Physical, chart, labs and discussed the procedure including the risks, benefits and alternatives for the proposed anesthesia with the patient or authorized representative who has indicated his/her understanding and acceptance.   Dental advisory given  Plan Discussed with: CRNA and Anesthesiologist  Anesthesia Plan Comments:         Anesthesia Quick Evaluation

## 2015-06-12 NOTE — Anesthesia Procedure Notes (Signed)
Procedure Name: Intubation Date/Time: 06/12/2015 11:17 AM Performed by: Ofilia Neas Pre-anesthesia Checklist: Patient identified, Timeout performed, Emergency Drugs available, Suction available and Patient being monitored Patient Re-evaluated:Patient Re-evaluated prior to inductionOxygen Delivery Method: Circle system utilized Preoxygenation: Pre-oxygenation with 100% oxygen Intubation Type: IV induction, Cricoid Pressure applied and Rapid sequence Laryngoscope Size: Mac and 4 Grade View: Grade II Tube type: Oral Tube size: 7.5 mm Number of attempts: 1 Airway Equipment and Method: Stylet Placement Confirmation: ETT inserted through vocal cords under direct vision,  positive ETCO2 and breath sounds checked- equal and bilateral Secured at: 21 cm Tube secured with: Tape Dental Injury: Teeth and Oropharynx as per pre-operative assessment

## 2015-06-13 ENCOUNTER — Encounter (HOSPITAL_COMMUNITY): Payer: Self-pay | Admitting: General Surgery

## 2015-06-13 LAB — CBC
HEMATOCRIT: 44.8 % (ref 39.0–52.0)
HEMOGLOBIN: 15.2 g/dL (ref 13.0–17.0)
MCH: 30.3 pg (ref 26.0–34.0)
MCHC: 33.9 g/dL (ref 30.0–36.0)
MCV: 89.4 fL (ref 78.0–100.0)
Platelets: 245 10*3/uL (ref 150–400)
RBC: 5.01 MIL/uL (ref 4.22–5.81)
RDW: 12.7 % (ref 11.5–15.5)
WBC: 14.1 10*3/uL — ABNORMAL HIGH (ref 4.0–10.5)

## 2015-06-13 LAB — BASIC METABOLIC PANEL
Anion gap: 12 (ref 5–15)
BUN: 8 mg/dL (ref 6–20)
CHLORIDE: 99 mmol/L — AB (ref 101–111)
CO2: 25 mmol/L (ref 22–32)
CREATININE: 0.59 mg/dL — AB (ref 0.61–1.24)
Calcium: 9 mg/dL (ref 8.9–10.3)
GFR calc Af Amer: >60 mL/min (ref >60)
GFR calc non Af Amer: >60 mL/min (ref >60)
GLUCOSE: 119 mg/dL — AB (ref 65–99)
Potassium: 4 mmol/L (ref 3.5–5.1)
Sodium: 136 mmol/L (ref 135–145)

## 2015-06-13 NOTE — Progress Notes (Signed)
[facility] PROGRESS NOTE  John Moon [id number] DOB: [birth year] DOA: 06/09/2015 PCP: No primary care provider on file.  HPI/Brief narrative Noriel Claypool today has, No headache, No chest pain, still has some diarrhea and generalized abdominal pain - No Nausea, No new weakness tingling or numbness, No Cough - SOB  Assessment/Plan: 1. Colonic adenocarcinoma  -Patient status post colonoscopy on 06/10/2015. Patient noted to have multiple polyps with mass noted. Biopsy of mass is noted to be positive for invasive adenocarcinoma -Gen. surgery and gastroenterology following -I have since discussed the case with oncology, who will arrange for close follow-up for this patient -Patient is now status post right and transverse colectomy with mobilization of splenic flexure and primary small bowel to left colon anastomosis on 06/12/2015 -This a.m. Patient reports mild postoperative pain otherwise is without complaints  2. History of smoking. Counseled to quit.  3. Hypertension.  -Blood pressure currently suboptimally controlled, question postop discomfort -Patient is continued on when necessary hydralazine  4. DVT prophylaxis -SCDs while admitted  Code Status: Full Family Communication: Pt in room, family at bedside Disposition Plan: Home when cleared by GI and Surgery   Consultants:  General surgery  Gastroenterology  Oncology  Procedures:  Colonoscopy 06/10/2015  Surgery 06/12/2015  Antibiotics: Anti-infectives    Start     Dose/Rate Route Frequency Ordered Stop   06/12/15 0600  cefoTEtan (CEFOTAN) 2 g in dextrose 5 % 50 mL IVPB     2 g 100 mL/hr over 30 Minutes Intravenous 30 min pre-op 06/11/15 1237 06/12/15 1115   06/09/15 0830  metroNIDAZOLE (FLAGYL) tablet 500 mg  Status:  Discontinued     500 mg Oral 3 times per day 06/09/15 0825 06/11/15 1011   06/09/15 0230  ciprofloxacin (CIPRO) IVPB 400 mg     400 mg 200 mL/hr over 60 Minutes Intravenous  Once 06/09/15  0222 06/09/15 0345   06/09/15 0230  metroNIDAZOLE (FLAGYL) IVPB 500 mg     500 mg 100 mL/hr over 60 Minutes Intravenous  Once 06/09/15 0222 06/09/15 0603      HPI/Subjective: Patient today feels well. He does however complain of mild abdominal pain postoperatively  Objective: Filed Vitals:   06/13/15 0648 06/13/15 1422 06/13/15 1653 06/13/15 1728  BP:  168/97  158/96  Pulse:  91    Temp:  99.3 F (37.4 C) 99 F (37.2 C)   TempSrc:  Oral    Resp:  20    Height:      Weight: 57.3 kg (126 lb 5.2 oz)     SpO2:  100%      Intake/Output Summary (Last 24 hours) at 06/13/15 1734 Last data filed at 06/13/15 1400  Gross per 24 hour  Intake 2768.75 ml  Output   3420 ml  Net -651.25 ml   Filed Weights   06/10/15 0545 06/10/15 1151 06/13/15 0648  Weight: 59.1 kg (130 lb 4.7 oz) 58.968 kg (130 lb) 57.3 kg (126 lb 5.2 oz)    Exam:   General:  Awake, in nad, lying in bed  Cardiovascular: regular, s1, s2  Respiratory: normal resp effort, no wheezing  Abdomen: soft,nondistended, mildly tender  Musculoskeletal: perfused, no cyanosis  Data Reviewed: Basic Metabolic Panel:  Recent Labs Lab 06/09/15 0229 06/10/15 0455 06/11/15 0520 06/13/15 0459  NA 139 140 141 136  K 4.0 4.0 3.7 4.0  CL 104 105 106 99*  CO2 23 23 26 25   GLUCOSE 131* 140* 105* 119*  BUN 11 7 7  8  CREATININE 0.66 0.65 0.74 0.59*  CALCIUM 9.1 9.4 8.8* 9.0  MG  --   --  1.9  --    Liver Function Tests:  Recent Labs Lab 06/09/15 0229  AST 18  ALT 17  ALKPHOS 76  BILITOT 0.4  PROT 7.5  ALBUMIN 4.4    Recent Labs Lab 06/09/15 0229  LIPASE 156*   No results for input(s): AMMONIA in the last 168 hours. CBC:  Recent Labs Lab 06/09/15 0229 06/10/15 0455 06/11/15 0520 06/13/15 0459  WBC 12.6* 19.6* 13.8* 14.1*  HGB 15.2 16.4 12.7* 15.2  HCT 45.2 48.6 38.5* 44.8  MCV 92.2 90.8 92.8 89.4  PLT 227 261 217 245   Cardiac Enzymes: No results for input(s): CKTOTAL, CKMB, CKMBINDEX,  TROPONINI in the last 168 hours. BNP (last 3 results) No results for input(s): BNP in the last 8760 hours.  ProBNP (last 3 results) No results for input(s): PROBNP in the last 8760 hours.  CBG: No results for input(s): GLUCAP in the last 168 hours.  Recent Results (from the past 240 hour(s))  C difficile quick scan w PCR reflex     Status: None   Collection Time: 06/11/15  8:55 AM  Result Value Ref Range Status   C Diff antigen NEGATIVE NEGATIVE Final   C Diff toxin NEGATIVE NEGATIVE Final   C Diff interpretation Negative for toxigenic C. difficile  Final  Surgical pcr screen     Status: None   Collection Time: 06/11/15 10:44 PM  Result Value Ref Range Status   MRSA, PCR NEGATIVE NEGATIVE Final   Staphylococcus aureus NEGATIVE NEGATIVE Final    Comment:        The Xpert SA Assay (FDA approved for NASAL specimens in patients over 45 years of age), is one component of a comprehensive surveillance program.  Test performance has been validated by [facility] for patients greater than or equal to 45 year old. It is not intended to diagnose infection nor to guide or monitor treatment.      Studies: No results found.  Scheduled Meds: . heparin  5,000 Units Subcutaneous 3 times per day  . ondansetron (ZOFRAN) IV  4 mg Intravenous 4 times per day  . pantoprazole (PROTONIX) IV  40 mg Intravenous QHS   Continuous Infusions: . sodium chloride 125 mL/hr at 06/13/15 1436    Active Problems:   Colitis   Colonic mass   Benign neoplasm of colon   Colon cancer ([place])   Aspiration into airway    CHIU, STEPHEN K  [facility] Pager [phone]. If 7PM-7AM, please contact night-coverage at [email or web address], password [facility] 06/13/2015, 5:34 PM  LOS: 4 days

## 2015-06-13 NOTE — Progress Notes (Signed)
Patient ID: John Moon, male   DOB: [birth year], 44 y.o.   MRN: [id number] 1 Day Post-Op  Subjective: Pt has pain today, but otherwise ok.  Objective: Vital signs in last 24 hours: Temp:  [97.9 F (36.6 C)-99.3 F (37.4 C)] 98.9 F (37.2 C) (12/23 0500) Pulse Rate:  [77-92] 92 (12/23 0500) Resp:  [10-16] 16 (12/23 0500) BP: (145-179)/(92-109) 165/96 mmHg (12/23 0500) SpO2:  [98 %-100 %] 99 % (12/23 0500) Weight:  [57.3 kg (126 lb 5.2 oz)] 57.3 kg (126 lb 5.2 oz) (12/23 0648) Last BM Date: 06/11/15  Intake/Output from previous day: 12/22 0701 - 12/23 0700 In: 4618.8 [I.V.:4568.8; IV Piggyback:50] Out: 4395 [Urine:4200; Emesis/NG output:70; Blood:25] Intake/Output this shift:    PE: Abd: soft, appropriately tender, hypoactive BS, NGT with minimal output (70cc), midline incision and port sites are c/d/i Heart: regular Lungs: CTAB  Lab Results:   Recent Labs  06/11/15 0520 06/13/15 0459  WBC 13.8* 14.1*  HGB 12.7* 15.2  HCT 38.5* 44.8  PLT 217 245   BMET  Recent Labs  06/11/15 0520 06/13/15 0459  NA 141 136  K 3.7 4.0  CL 106 99*  CO2 26 25  GLUCOSE 105* 119*  BUN 7 8  CREATININE 0.74 0.59*  CALCIUM 8.8* 9.0   PT/INR No results for input(s): LABPROT, INR in the last 72 hours. CMP     Component Value Date/Time   NA 136 06/13/2015 0459   K 4.0 06/13/2015 0459   CL 99* 06/13/2015 0459   CO2 25 06/13/2015 0459   GLUCOSE 119* 06/13/2015 0459   BUN 8 06/13/2015 0459   CREATININE 0.59* 06/13/2015 0459   CREATININE 0.65 03/11/2015 1926   CALCIUM 9.0 06/13/2015 0459   PROT 7.5 06/09/2015 0229   ALBUMIN 4.4 06/09/2015 0229   AST 18 06/09/2015 0229   ALT 17 06/09/2015 0229   ALKPHOS 76 06/09/2015 0229   BILITOT 0.4 06/09/2015 0229   GFRNONAA >60 06/13/2015 0459   GFRNONAA >89 03/11/2015 1926   GFRAA >60 06/13/2015 0459   GFRAA >89 03/11/2015 1926   Lipase     Component Value Date/Time   LIPASE 156* 06/09/2015 0229       Studies/Results: Ct  Chest W Contrast  06/11/2015  CLINICAL DATA:  Restaging colon cancer. EXAM: CT CHEST WITH CONTRAST TECHNIQUE: Multidetector CT imaging of the chest was performed during intravenous contrast administration. CONTRAST:  69mL OMNIPAQUE IOHEXOL 300 MG/ML  SOLN COMPARISON:  CT abdomen pelvis 06/09/2015. FINDINGS: Mediastinum/Nodes: No pathologically enlarged mediastinal, hilar or axillary lymph nodes. Heart size normal. No pericardial effusion. Lungs/Pleura: Trace left pleural effusion. Trace paraseptal emphysema. Subsegmental atelectasis in the lower lobes, left greater than right. Peribronchovascular ground-glass nodularity in the left upper lobe (example series 5, image 23). Airway is unremarkable. Upper abdomen: Visualized portion of the liver is unremarkable. There may be vicarious excretion of contrast in the gallbladder. Visualized portions of the adrenal glands, kidneys, spleen, pancreas and stomach are grossly unremarkable. Dilated fluid-filled transverse colon with a mass at the junction of the transverse and descending colon (series 2, image 55). Small ascites. Musculoskeletal: No worrisome lytic or sclerotic lesions. IMPRESSION: 1. No evidence of metastatic disease in the chest. 2. Trace left pleural effusion with probable atelectasis in the left lower lobe. 3. Peribronchovascular ground-glass nodularity in the left upper lobe is likely infectious or inflammatory in etiology. 4. Fluid-filled and dilated transverse colon, indicative of obstruction related to a mass at the junction of the transverse and descending colon.  Please refer to CT abdomen pelvis 06/09/2015 for further details. Electronically Signed   By: Lorin Picket M.D.   On: 06/11/2015 11:02    Anti-infectives: Anti-infectives    Start     Dose/Rate Route Frequency Ordered Stop   06/12/15 0600  cefoTEtan (CEFOTAN) 2 g in dextrose 5 % 50 mL IVPB     2 g 100 mL/hr over 30 Minutes Intravenous 30 min pre-op 06/11/15 1237 06/12/15 1115    06/09/15 0830  metroNIDAZOLE (FLAGYL) tablet 500 mg  Status:  Discontinued     500 mg Oral 3 times per day 06/09/15 0825 06/11/15 1011   06/09/15 0230  ciprofloxacin (CIPRO) IVPB 400 mg     400 mg 200 mL/hr over 60 Minutes Intravenous  Once 06/09/15 0222 06/09/15 0345   06/09/15 0230  metroNIDAZOLE (FLAGYL) IVPB 500 mg     500 mg 100 mL/hr over 60 Minutes Intravenous  Once 06/09/15 0222 06/09/15 0603       Assessment/Plan  1. Colonic adenocarcinoma, POD 1, s/p right and transverse colectomy with ileocolic anastomosis -cont NGT today, although minimal output.  Can likely DC this tomorrow -foley DC today -mobilize in halls TID -cont with IV pain meds for now -await pathology, will likely not be back until early to mid next week given holidays DVT proph -scds/lovenox  LOS: 4 days    Shanterica Biehler E 06/13/2015, 8:20 AM Pager: [phone]

## 2015-06-14 NOTE — Progress Notes (Signed)
2 Days Post-Op  Subjective: Pt with no acute changes this AM  Objective: Vital signs in last 24 hours: Temp:  [98.4 F (36.9 C)-99.3 F (37.4 C)] 98.4 F (36.9 C) (12/24 0400) Pulse Rate:  [91-119] 105 (12/24 0400) Resp:  [18-20] 18 (12/24 0400) BP: (131-168)/(83-97) 144/96 mmHg (12/24 0400) SpO2:  [96 %-100 %] 97 % (12/24 0400) Last BM Date: 06/11/15  Intake/Output from previous day: 12/23 0701 - 12/24 0700 In: 1000 [I.V.:1000] Out: 1750 [Urine:1050; Emesis/NG output:700] Intake/Output this shift:    General appearance: alert and cooperative GI: soft, approp ttp, nd, wound c/d/i, hypoactive BS  Lab Results:   Recent Labs  06/13/15 0459  WBC 14.1*  HGB 15.2  HCT 44.8  PLT 245   BMET  Recent Labs  06/13/15 0459  NA 136  K 4.0  CL 99*  CO2 25  GLUCOSE 119*  BUN 8  CREATININE 0.59*  CALCIUM 9.0    Anti-infectives: Anti-infectives    Start     Dose/Rate Route Frequency Ordered Stop   06/12/15 0600  cefoTEtan (CEFOTAN) 2 g in dextrose 5 % 50 mL IVPB     2 g 100 mL/hr over 30 Minutes Intravenous 30 min pre-op 06/11/15 1237 06/12/15 1115   06/09/15 0830  metroNIDAZOLE (FLAGYL) tablet 500 mg  Status:  Discontinued     500 mg Oral 3 times per day 06/09/15 0825 06/11/15 1011   06/09/15 0230  ciprofloxacin (CIPRO) IVPB 400 mg     400 mg 200 mL/hr over 60 Minutes Intravenous  Once 06/09/15 0222 06/09/15 0345   06/09/15 0230  metroNIDAZOLE (FLAGYL) IVPB 500 mg     500 mg 100 mL/hr over 60 Minutes Intravenous  Once 06/09/15 0222 06/09/15 0603      Assessment/Plan: s/p Procedure(s): LAPAROSCOPIC ASSISTED  PARTIAL COLECTOMY (N/A) PARTIAL COLECTOMY clamp NGT  Mobilize Await bowel function prior to PO   LOS: 5 days    Rosario Jacks., [facility] 06/14/2015

## 2015-06-14 NOTE — Progress Notes (Signed)
[facility] PROGRESS NOTE  KAIPO BEHNING [id number] DOB: 25-Dec-1970 DOA: 06/09/2015 PCP: No primary care provider on file.  HPI/Brief narrative Charleton Fera today has, No headache, No chest pain, still has some diarrhea and generalized abdominal pain - No Nausea, No new weakness tingling or numbness, No Cough - SOB  Assessment/Plan: 1. Colonic adenocarcinoma  -Patient status post colonoscopy on 06/10/2015. Patient noted to have multiple polyps with mass noted. Biopsy of mass is noted to be positive for invasive adenocarcinoma -Gen. surgery and gastroenterology following -I have since discussed the case with oncology, who will arrange for close follow-up for this patient -Patient is now status post right and transverse colectomy with mobilization of splenic flexure and primary small bowel to left colon anastomosis on 06/12/2015 -NG in place, now clamped. Awaiting return of bowel function before starting PO  2. History of smoking.  -Counseled to quit.  3. Hypertension.  -Blood pressure currently suboptimally controlled, question postop discomfort -Patient is continued on when necessary hydralazine  4. DVT prophylaxis -SCDs while admitted  Code Status: Full Family Communication: Pt in room, family at bedside Disposition Plan: Home when cleared by GI and Surgery   Consultants:  General surgery  Gastroenterology  Oncology  Procedures:  Colonoscopy 06/10/2015  Surgery 06/12/2015  Antibiotics: Anti-infectives    Start     Dose/Rate Route Frequency Ordered Stop   06/12/15 0600  cefoTEtan (CEFOTAN) 2 g in dextrose 5 % 50 mL IVPB     2 g 100 mL/hr over 30 Minutes Intravenous 30 min pre-op 06/11/15 1237 06/12/15 1115   06/09/15 0830  metroNIDAZOLE (FLAGYL) tablet 500 mg  Status:  Discontinued     500 mg Oral 3 times per day 06/09/15 0825 06/11/15 1011   06/09/15 0230  ciprofloxacin (CIPRO) IVPB 400 mg     400 mg 200 mL/hr over 60 Minutes Intravenous  Once 06/09/15  0222 06/09/15 0345   06/09/15 0230  metroNIDAZOLE (FLAGYL) IVPB 500 mg     500 mg 100 mL/hr over 60 Minutes Intravenous  Once 06/09/15 0222 06/09/15 0603      HPI/Subjective: No complaints this AM. Reports feeling better. Denies nausea  Objective: Filed Vitals:   06/13/15 1818 06/13/15 2037 06/14/15 0022 06/14/15 0400  BP: 139/83 131/86 142/93 144/96  Pulse:  119 100 105  Temp:  98.7 F (37.1 C) 98.8 F (37.1 C) 98.4 F (36.9 C)  TempSrc:  Oral Oral Oral  Resp:  19 18 18   Height:      Weight:      SpO2:  96% 97% 97%    Intake/Output Summary (Last 24 hours) at 06/14/15 1014 Last data filed at 06/14/15 0845  Gross per 24 hour  Intake   1000 ml  Output   1750 ml  Net   -750 ml   Filed Weights   06/10/15 0545 06/10/15 1151 06/13/15 0648  Weight: 59.1 kg (130 lb 4.7 oz) 58.968 kg (130 lb) 57.3 kg (126 lb 5.2 oz)    Exam:   General:  Awake, in nad, lying in bed  Cardiovascular: regular, s1, s2, no wheezing  Respiratory: normal resp effort, no wheezing  Abdomen: soft,nondistended, mildly tender, hypoactive BS  Musculoskeletal: perfused, no cyanosis, no clubbing  Data Reviewed: Basic Metabolic Panel:  Recent Labs Lab 06/09/15 0229 06/10/15 0455 06/11/15 0520 06/13/15 0459  NA 139 140 141 136  K 4.0 4.0 3.7 4.0  CL 104 105 106 99*  CO2 23 23 26 25   GLUCOSE 131* 140* 105*  119*  BUN 11 7 7 8   CREATININE 0.66 0.65 0.74 0.59*  CALCIUM 9.1 9.4 8.8* 9.0  MG  --   --  1.9  --    Liver Function Tests:  Recent Labs Lab 06/09/15 0229  AST 18  ALT 17  ALKPHOS 76  BILITOT 0.4  PROT 7.5  ALBUMIN 4.4    Recent Labs Lab 06/09/15 0229  LIPASE 156*   No results for input(s): AMMONIA in the last 168 hours. CBC:  Recent Labs Lab 06/09/15 0229 06/10/15 0455 06/11/15 0520 06/13/15 0459  WBC 12.6* 19.6* 13.8* 14.1*  HGB 15.2 16.4 12.7* 15.2  HCT 45.2 48.6 38.5* 44.8  MCV 92.2 90.8 92.8 89.4  PLT 227 261 217 245   Cardiac Enzymes: No results for  input(s): CKTOTAL, CKMB, CKMBINDEX, TROPONINI in the last 168 hours. BNP (last 3 results) No results for input(s): BNP in the last 8760 hours.  ProBNP (last 3 results) No results for input(s): PROBNP in the last 8760 hours.  CBG: No results for input(s): GLUCAP in the last 168 hours.  Recent Results (from the past 240 hour(s))  C difficile quick scan w PCR reflex     Status: None   Collection Time: 06/11/15  8:55 AM  Result Value Ref Range Status   C Diff antigen NEGATIVE NEGATIVE Final   C Diff toxin NEGATIVE NEGATIVE Final   C Diff interpretation Negative for toxigenic C. difficile  Final  Surgical pcr screen     Status: None   Collection Time: 06/11/15 10:44 PM  Result Value Ref Range Status   MRSA, PCR NEGATIVE NEGATIVE Final   Staphylococcus aureus NEGATIVE NEGATIVE Final    Comment:        The Xpert SA Assay (FDA approved for NASAL specimens in patients over 70 years of age), is one component of a comprehensive surveillance program.  Test performance has been validated by [facility] for patients greater than or equal to 31 year old. It is not intended to diagnose infection nor to guide or monitor treatment.      Studies: No results found.  Scheduled Meds: . heparin  5,000 Units Subcutaneous 3 times per day  . ondansetron (ZOFRAN) IV  4 mg Intravenous 4 times per day  . pantoprazole (PROTONIX) IV  40 mg Intravenous QHS   Continuous Infusions: . sodium chloride 125 mL/hr at 06/13/15 1436    Active Problems:   Colitis   Colonic mass   Benign neoplasm of colon   Colon cancer ([place])   Aspiration into airway    CHIU, STEPHEN K  [facility] Pager [phone]. If 7PM-7AM, please contact night-coverage at [email or web address], password [facility] 06/14/2015, 10:14 AM  LOS: 5 days

## 2015-06-15 DIAGNOSIS — C189 Malignant neoplasm of colon, unspecified: Secondary | ICD-10-CM

## 2015-06-15 DIAGNOSIS — C182 Malignant neoplasm of ascending colon: Secondary | ICD-10-CM

## 2015-06-15 NOTE — Progress Notes (Signed)
[facility] PROGRESS NOTE  John Moon [id number] DOB: Oct 23, 1970 DOA: 06/09/2015 PCP: No primary care provider on file.  HPI/Brief narrative Bento Gantert today has, No headache, No chest pain, still has some diarrhea and generalized abdominal pain - No Nausea, No new weakness tingling or numbness, No Cough - SOB  Assessment/Plan: 1. Colonic adenocarcinoma  -Patient status post colonoscopy on 06/10/2015. Patient noted to have multiple polyps with mass noted. Biopsy of mass is noted to be positive for invasive adenocarcinoma -Gen. surgery and gastroenterology following -Oncology was notified, who will arrange for close follow-up for this patient -Patient is now status post right and transverse colectomy with mobilization of splenic flexure and primary small bowel to left colon anastomosis on 06/12/2015 -Bowel function returning -NG d/c'd and pt started full liquids   2. History of smoking.  -Counseled to quit.  3. Hypertension.  -Blood pressure currently suboptimally controlled, question postop discomfort -Patient is continued on when necessary hydralazine  4. DVT prophylaxis -SCDs while admitted  Code Status: Full Family Communication: Pt in room, family at bedside Disposition Plan: Home when cleared by GI and Surgery   Consultants:  General surgery  Gastroenterology  Oncology  Procedures:  Colonoscopy 06/10/2015  Surgery 06/12/2015  Antibiotics: Anti-infectives    Start     Dose/Rate Route Frequency Ordered Stop   06/12/15 0600  cefoTEtan (CEFOTAN) 2 g in dextrose 5 % 50 mL IVPB     2 g 100 mL/hr over 30 Minutes Intravenous 30 min pre-op 06/11/15 1237 06/12/15 1115   06/09/15 0830  metroNIDAZOLE (FLAGYL) tablet 500 mg  Status:  Discontinued     500 mg Oral 3 times per day 06/09/15 0825 06/11/15 1011   06/09/15 0230  ciprofloxacin (CIPRO) IVPB 400 mg     400 mg 200 mL/hr over 60 Minutes Intravenous  Once 06/09/15 0222 06/09/15 0345   06/09/15 0230   metroNIDAZOLE (FLAGYL) IVPB 500 mg     500 mg 100 mL/hr over 60 Minutes Intravenous  Once 06/09/15 0222 06/09/15 0603      HPI/Subjective: Feels better today. Has mild abd pain  Objective: Filed Vitals:   06/14/15 0400 06/14/15 1402 06/14/15 2214 06/15/15 0557  BP: 144/96 143/78 145/95 147/92  Pulse: 105 107 98 97  Temp: 98.4 F (36.9 C) 98.5 F (36.9 C) 98.2 F (36.8 C) 98.9 F (37.2 C)  TempSrc: Oral Oral Oral Oral  Resp: 18 16 18 18   Height:      Weight:    56.201 kg (123 lb 14.4 oz)  SpO2: 97% 98% 96% 97%    Intake/Output Summary (Last 24 hours) at 06/15/15 1243 Last data filed at 06/15/15 0900  Gross per 24 hour  Intake      0 ml  Output   1100 ml  Net  -1100 ml   Filed Weights   06/10/15 1151 06/13/15 0648 06/15/15 0557  Weight: 58.968 kg (130 lb) 57.3 kg (126 lb 5.2 oz) 56.201 kg (123 lb 14.4 oz)    Exam:   General:  Awake, in nad, lying in bed  Cardiovascular: regular, s1, s2, no wheezing  Respiratory: normal resp effort, no wheezing  Abdomen: soft,nondistended, mildly tender, hypoactive BS  Musculoskeletal: perfused, no clubbing  Data Reviewed: Basic Metabolic Panel:  Recent Labs Lab 06/09/15 0229 06/10/15 0455 06/11/15 0520 06/13/15 0459  NA 139 140 141 136  K 4.0 4.0 3.7 4.0  CL 104 105 106 99*  CO2 23 23 26 25   GLUCOSE 131* 140* 105* 119*  BUN 11 7 7 8   CREATININE 0.66 0.65 0.74 0.59*  CALCIUM 9.1 9.4 8.8* 9.0  MG  --   --  1.9  --    Liver Function Tests:  Recent Labs Lab 06/09/15 0229  AST 18  ALT 17  ALKPHOS 76  BILITOT 0.4  PROT 7.5  ALBUMIN 4.4    Recent Labs Lab 06/09/15 0229  LIPASE 156*   No results for input(s): AMMONIA in the last 168 hours. CBC:  Recent Labs Lab 06/09/15 0229 06/10/15 0455 06/11/15 0520 06/13/15 0459  WBC 12.6* 19.6* 13.8* 14.1*  HGB 15.2 16.4 12.7* 15.2  HCT 45.2 48.6 38.5* 44.8  MCV 92.2 90.8 92.8 89.4  PLT 227 261 217 245   Cardiac Enzymes: No results for input(s):  CKTOTAL, CKMB, CKMBINDEX, TROPONINI in the last 168 hours. BNP (last 3 results) No results for input(s): BNP in the last 8760 hours.  ProBNP (last 3 results) No results for input(s): PROBNP in the last 8760 hours.  CBG: No results for input(s): GLUCAP in the last 168 hours.  Recent Results (from the past 240 hour(s))  C difficile quick scan w PCR reflex     Status: None   Collection Time: 06/11/15  8:55 AM  Result Value Ref Range Status   C Diff antigen NEGATIVE NEGATIVE Final   C Diff toxin NEGATIVE NEGATIVE Final   C Diff interpretation Negative for toxigenic C. difficile  Final  Surgical pcr screen     Status: None   Collection Time: 06/11/15 10:44 PM  Result Value Ref Range Status   MRSA, PCR NEGATIVE NEGATIVE Final   Staphylococcus aureus NEGATIVE NEGATIVE Final    Comment:        The Xpert SA Assay (FDA approved for NASAL specimens in patients over 27 years of age), is one component of a comprehensive surveillance program.  Test performance has been validated by [facility] for patients greater than or equal to 47 year old. It is not intended to diagnose infection nor to guide or monitor treatment.      Studies: No results found.  Scheduled Meds: . heparin  5,000 Units Subcutaneous 3 times per day  . ondansetron (ZOFRAN) IV  4 mg Intravenous 4 times per day  . pantoprazole (PROTONIX) IV  40 mg Intravenous QHS   Continuous Infusions: . sodium chloride 125 mL/hr at 06/14/15 1457    Active Problems:   Colitis   Colonic mass   Benign neoplasm of colon   Colon cancer ([place])   Aspiration into airway    CHIU, STEPHEN K  [facility] Pager [phone]. If 7PM-7AM, please contact night-coverage at [email or web address], password [facility] 06/15/2015, 12:43 PM  LOS: 6 days

## 2015-06-15 NOTE — Progress Notes (Signed)
3 Days Post-Op  Subjective: Doing well this AM +Flatus Ambulating in hall    Objective: Vital signs in last 24 hours: Temp:  [98.2 F (36.8 C)-98.9 F (37.2 C)] 98.9 F (37.2 C) (12/25 0557) Pulse Rate:  [97-107] 97 (12/25 0557) Resp:  [16-18] 18 (12/25 0557) BP: (143-147)/(78-95) 147/92 mmHg (12/25 0557) SpO2:  [96 %-98 %] 97 % (12/25 0557) Weight:  [56.201 kg (123 lb 14.4 oz)] 56.201 kg (123 lb 14.4 oz) (12/25 0557) Last BM Date: 06/11/15  Intake/Output from previous day: 12/24 0701 - 12/25 0700 In: 0  Out: 1350 [Urine:1350] Intake/Output this shift:    General appearance: alert and cooperative GI: soft, non-tender; bowel sounds normal; no masses,  no organomegaly and acitve BS, incision c/d/i  Lab Results:   Recent Labs  06/13/15 0459  WBC 14.1*  HGB 15.2  HCT 44.8  PLT 245   BMET  Recent Labs  06/13/15 0459  NA 136  K 4.0  CL 99*  CO2 25  GLUCOSE 119*  BUN 8  CREATININE 0.59*  CALCIUM 9.0    Anti-infectives: Anti-infectives    Start     Dose/Rate Route Frequency Ordered Stop   06/12/15 0600  cefoTEtan (CEFOTAN) 2 g in dextrose 5 % 50 mL IVPB     2 g 100 mL/hr over 30 Minutes Intravenous 30 min pre-op 06/11/15 1237 06/12/15 1115   06/09/15 0830  metroNIDAZOLE (FLAGYL) tablet 500 mg  Status:  Discontinued     500 mg Oral 3 times per day 06/09/15 0825 06/11/15 1011   06/09/15 0230  ciprofloxacin (CIPRO) IVPB 400 mg     400 mg 200 mL/hr over 60 Minutes Intravenous  Once 06/09/15 0222 06/09/15 0345   06/09/15 0230  metroNIDAZOLE (FLAGYL) IVPB 500 mg     500 mg 100 mL/hr over 60 Minutes Intravenous  Once 06/09/15 0222 06/09/15 0603      Assessment/Plan: s/p Procedure(s): LAPAROSCOPIC ASSISTED  PARTIAL COLECTOMY (N/A) PARTIAL COLECTOMY DC NGT Start FLD  mobilize  LOS: 6 days    John Moon., John Moon 06/15/2015

## 2015-06-16 LAB — BASIC METABOLIC PANEL
ANION GAP: 10 (ref 5–15)
BUN: 11 mg/dL (ref 6–20)
CHLORIDE: 103 mmol/L (ref 101–111)
CO2: 25 mmol/L (ref 22–32)
Calcium: 8.4 mg/dL — ABNORMAL LOW (ref 8.9–10.3)
Creatinine, Ser: 0.49 mg/dL — ABNORMAL LOW (ref 0.61–1.24)
GFR calc Af Amer: >60 mL/min (ref >60)
GLUCOSE: 122 mg/dL — AB (ref 65–99)
POTASSIUM: 3.8 mmol/L (ref 3.5–5.1)
Sodium: 138 mmol/L (ref 135–145)

## 2015-06-16 LAB — CBC
HEMATOCRIT: 41.8 % (ref 39.0–52.0)
HEMOGLOBIN: 13.9 g/dL (ref 13.0–17.0)
MCH: 30.2 pg (ref 26.0–34.0)
MCHC: 33.3 g/dL (ref 30.0–36.0)
MCV: 90.7 fL (ref 78.0–100.0)
Platelets: 273 10*3/uL (ref 150–400)
RBC: 4.61 MIL/uL (ref 4.22–5.81)
RDW: 12.8 % (ref 11.5–15.5)
WBC: 16.5 10*3/uL — ABNORMAL HIGH (ref 4.0–10.5)

## 2015-06-16 NOTE — Progress Notes (Signed)
4 Days Post-Op  Subjective: Pt tol FLD well +BM  Objective: Vital signs in last 24 hours: Temp:  [98.4 F (36.9 C)-99.7 F (37.6 C)] 98.4 F (36.9 C) (12/26 0508) Pulse Rate:  [86-103] 86 (12/26 0508) Resp:  [17-18] 18 (12/26 0508) BP: (131-140)/(78-86) 140/78 mmHg (12/26 0508) SpO2:  [94 %-97 %] 97 % (12/26 0508) Weight:  [57.516 kg (126 lb 12.8 oz)] 57.516 kg (126 lb 12.8 oz) (12/26 0508) Last BM Date: 06/15/15  Intake/Output from previous day: 12/25 0701 - 12/26 0700 In: 580 [P.O.:580] Out: 300 [Urine:300] Intake/Output this shift:    General appearance: alert and cooperative GI: soft, non-tender; bowel sounds normal; no masses,  no organomegaly and incision c/d/i  Lab Results:   Recent Labs  06/16/15 0530  WBC 16.5*  HGB 13.9  HCT 41.8  PLT 273   BMET  Recent Labs  06/16/15 0530  NA 138  K 3.8  CL 103  CO2 25  GLUCOSE 122*  BUN 11  CREATININE 0.49*  CALCIUM 8.4*    Anti-infectives: Anti-infectives    Start     Dose/Rate Route Frequency Ordered Stop   06/12/15 0600  cefoTEtan (CEFOTAN) 2 g in dextrose 5 % 50 mL IVPB     2 g 100 mL/hr over 30 Minutes Intravenous 30 min pre-op 06/11/15 1237 06/12/15 1115   06/09/15 0830  metroNIDAZOLE (FLAGYL) tablet 500 mg  Status:  Discontinued     500 mg Oral 3 times per day 06/09/15 0825 06/11/15 1011   06/09/15 0230  ciprofloxacin (CIPRO) IVPB 400 mg     400 mg 200 mL/hr over 60 Minutes Intravenous  Once 06/09/15 0222 06/09/15 0345   06/09/15 0230  metroNIDAZOLE (FLAGYL) IVPB 500 mg     500 mg 100 mL/hr over 60 Minutes Intravenous  Once 06/09/15 0222 06/09/15 0603      Assessment/Plan: s/p Procedure(s): LAPAROSCOPIC ASSISTED  PARTIAL COLECTOMY (N/A) PARTIAL COLECTOMY Advance diet soft Mobilize Home likely tomorrow if con't to do well  LOS: 7 days    Rosario Jacks., [facility] 06/16/2015

## 2015-06-16 NOTE — Progress Notes (Signed)
[facility] PROGRESS NOTE  John Moon [id number] DOB: [birth year] DOA: 06/09/2015 PCP: No primary care provider on file.  HPI/Brief narrative John Moon today has, No headache, No chest pain, still has some diarrhea and generalized abdominal pain - No Nausea, No new weakness tingling or numbness, No Cough - SOB  Assessment/Plan: 1. Colonic adenocarcinoma  -Patient status post colonoscopy on 06/10/2015. Patient noted to have multiple polyps with mass noted. Biopsy of mass is noted to be positive for invasive adenocarcinoma -Gen. surgery and gastroenterology following -Oncology was notified, who will arrange for close follow-up for this patient -Patient is now status post right and transverse colectomy with mobilization of splenic flexure and primary small bowel to left colon anastomosis on 06/12/2015 -Bowel function returning -Pt to advance to soft diet today -Per Surgery, possible d/c home tomorrow if stable  2. History of smoking.  -Counseled to quit this admission  3. Hypertension.  -Blood pressure currently suboptimally controlled, question postop discomfort -Patient is continued on when necessary hydralazine  4. DVT prophylaxis -SCDs while admitted  Code Status: Full Family Communication: Pt in room, family at bedside Disposition Plan: Possible d/c home 12/27   Consultants:  General surgery  Gastroenterology  Oncology  Procedures:  Colonoscopy 06/10/2015  Surgery 06/12/2015  Antibiotics: Anti-infectives    Start     Dose/Rate Route Frequency Ordered Stop   06/12/15 0600  cefoTEtan (CEFOTAN) 2 g in dextrose 5 % 50 mL IVPB     2 g 100 mL/hr over 30 Minutes Intravenous 30 min pre-op 06/11/15 1237 06/12/15 1115   06/09/15 0830  metroNIDAZOLE (FLAGYL) tablet 500 mg  Status:  Discontinued     500 mg Oral 3 times per day 06/09/15 0825 06/11/15 1011   06/09/15 0230  ciprofloxacin (CIPRO) IVPB 400 mg     400 mg 200 mL/hr over 60 Minutes Intravenous   Once 06/09/15 0222 06/09/15 0345   06/09/15 0230  metroNIDAZOLE (FLAGYL) IVPB 500 mg     500 mg 100 mL/hr over 60 Minutes Intravenous  Once 06/09/15 0222 06/09/15 0603      HPI/Subjective: Still with mild post-op pain, but states overall feeling better today  Objective: Filed Vitals:   06/15/15 0557 06/15/15 1425 06/15/15 2131 06/16/15 0508  BP: 147/92 131/79 135/86 140/78  Pulse: 97 92 103 86  Temp: 98.9 F (37.2 C) 99.7 F (37.6 C) 98.9 F (37.2 C) 98.4 F (36.9 C)  TempSrc: Oral Oral Oral Oral  Resp: 18 18 17 18   Height:      Weight: 56.201 kg (123 lb 14.4 oz)   57.516 kg (126 lb 12.8 oz)  SpO2: 97% 97% 94% 97%    Intake/Output Summary (Last 24 hours) at 06/16/15 1250 Last data filed at 06/15/15 2000  Gross per 24 hour  Intake    580 ml  Output    300 ml  Net    280 ml   Filed Weights   06/13/15 0648 06/15/15 0557 06/16/15 0508  Weight: 57.3 kg (126 lb 5.2 oz) 56.201 kg (123 lb 14.4 oz) 57.516 kg (126 lb 12.8 oz)    Exam:   General:  Awake, in nad, lying in bed  Cardiovascular: regular, s1, s2, no wheezing  Respiratory: normal resp effort, no wheezing  Abdomen: soft,nondistended, mildly tender, pos BS  Musculoskeletal: perfused, no clubbing, no cyanosis  Data Reviewed: Basic Metabolic Panel:  Recent Labs Lab 06/10/15 0455 06/11/15 0520 06/13/15 0459 06/16/15 0530  NA 140 141 136 138  K 4.0 3.7  4.0 3.8  CL 105 106 99* 103  CO2 23 26 25 25   GLUCOSE 140* 105* 119* 122*  BUN 7 7 8 11   CREATININE 0.65 0.74 0.59* 0.49*  CALCIUM 9.4 8.8* 9.0 8.4*  MG  --  1.9  --   --    Liver Function Tests: No results for input(s): AST, ALT, ALKPHOS, BILITOT, PROT, ALBUMIN in the last 168 hours. No results for input(s): LIPASE, AMYLASE in the last 168 hours. No results for input(s): AMMONIA in the last 168 hours. CBC:  Recent Labs Lab 06/10/15 0455 06/11/15 0520 06/13/15 0459 06/16/15 0530  WBC 19.6* 13.8* 14.1* 16.5*  HGB 16.4 12.7* 15.2 13.9  HCT  48.6 38.5* 44.8 41.8  MCV 90.8 92.8 89.4 90.7  PLT 261 217 245 273   Cardiac Enzymes: No results for input(s): CKTOTAL, CKMB, CKMBINDEX, TROPONINI in the last 168 hours. BNP (last 3 results) No results for input(s): BNP in the last 8760 hours.  ProBNP (last 3 results) No results for input(s): PROBNP in the last 8760 hours.  CBG: No results for input(s): GLUCAP in the last 168 hours.  Recent Results (from the past 240 hour(s))  C difficile quick scan w PCR reflex     Status: None   Collection Time: 06/11/15  8:55 AM  Result Value Ref Range Status   C Diff antigen NEGATIVE NEGATIVE Final   C Diff toxin NEGATIVE NEGATIVE Final   C Diff interpretation Negative for toxigenic C. difficile  Final  Surgical pcr screen     Status: None   Collection Time: 06/11/15 10:44 PM  Result Value Ref Range Status   MRSA, PCR NEGATIVE NEGATIVE Final   Staphylococcus aureus NEGATIVE NEGATIVE Final    Comment:        The Xpert SA Assay (FDA approved for NASAL specimens in patients over 77 years of age), is one component of a comprehensive surveillance program.  Test performance has been validated by [facility] for patients greater than or equal to 48 year old. It is not intended to diagnose infection nor to guide or monitor treatment.      Studies: No results found.  Scheduled Meds: . heparin  5,000 Units Subcutaneous 3 times per day  . ondansetron (ZOFRAN) IV  4 mg Intravenous 4 times per day  . pantoprazole (PROTONIX) IV  40 mg Intravenous QHS   Continuous Infusions: . sodium chloride 125 mL/hr at 06/15/15 1704    Principal Problem:   Colon adenocarcinoma [facility]) Active Problems:   Colitis   Colonic mass   Benign neoplasm of colon   Colon cancer ([place])   Aspiration into airway    Glennda Weatherholtz K  [facility] Pager [phone]. If 7PM-7AM, please contact night-coverage at [email or web address], password [email or web address] 06/16/2015, 12:50 PM  LOS: 7 days

## 2015-06-17 MED ORDER — OXYCODONE-ACETAMINOPHEN 5-325 MG PO TABS: 1.0000 | ORAL_TABLET | ORAL | Status: DC | PRN

## 2015-06-17 NOTE — Discharge Summary (Signed)
Physician Discharge Summary  John Moon [id number] DOB: 08/03/1970 DOA: 06/09/2015  PCP: No primary care provider on file.  Admit date: 06/09/2015 Discharge date: 06/17/2015  Time spent: 20 minutes  Recommendations for Outpatient Follow-up:  1. Follow up with Surgery as scheduled 2. 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 Briefly, 44yo who presented with diarrhea and generalized abdominal pain. The patient was admitted initially for concerns for possible colitis.  Hospital Course:   1. Colonic adenocarcinoma  -Patient status post colonoscopy on 06/10/2015. Patient noted to have multiple polyps with mass noted. Biopsy of mass is noted to be positive for invasive adenocarcinoma -Gen. surgery and gastroenterology following -Oncology was notified, who will arrange for close follow-up for this patient -Patient is now status post right and transverse colectomy with mobilization of splenic flexure and primary small bowel to left colon anastomosis on 06/12/2015 -Bowel function has returned -Pt successfully advanced to soft diet -Per Surgery, pt cleared for d/c  2. History of smoking.  -Counseled to quit this admission  3. Hypertension.  -Blood pressure currently suboptimally controlled, question postop discomfort -Patient is continued on when necessary hydralazine  4. DVT prophylaxis -SCDs while admitted  Procedures:  Colonoscopy 06/10/2015  Lap R and transverse colectomy with  mobilization of splenic flexure and primary small bowel to L colon anastamosis 06/12/2015  Consultations:  General surgery  Gastroenterology  Oncology  Discharge Exam: Filed Vitals:   06/16/15 0508 06/16/15 1613 06/16/15 2122 06/17/15 0458  BP: 140/78 144/96 127/74 137/80  Pulse: 86 97 80 90  Temp: 98.4 F (36.9 C) 98.9 F (37.2 C) 98.4 F (36.9 C) 98.2 F (36.8 C)  TempSrc: Oral Oral Oral Oral  Resp: 18 18 18 18   Height:      Weight: 57.516 kg (126 lb 12.8 oz)     SpO2: 97% 95% 98% 99%    General: awake, in nad Cardiovascular: regular, s1, s2 Respiratory: normal resp effort, no wheezing  Discharge Instructions     Medication List    STOP taking these medications        ciprofloxacin 500 MG tablet  Commonly known as:  CIPRO     metroNIDAZOLE 500 MG tablet  Commonly known as:  FLAGYL      TAKE these medications        oxyCODONE-acetaminophen 5-325 MG tablet  Commonly known as:  ROXICET  Take 1-2 tablets by mouth every 4 (four) hours as needed for severe pain.       No Known Allergies Follow-up Information    Follow up with [facility] On 06/20/2015.   Specialty:  General Surgery   Why:  For suture removal with nurse only at 3:30pm, arrive by 3:00pm for paperwork   Contact information:   [place] [address] [phone]       Follow up with Merrie Roof, MD On 07/08/2015.   Specialty:  General Surgery   Why:  9:00am, arrive by 8:45am for check in   Contact information:   [address] [place] [address] [phone]       Follow up with [facility] In 4 weeks.   Why:  at [phone]       The results of significant diagnostics from this hospitalization (including imaging, microbiology, ancillary and laboratory) are listed below for reference.    Significant Diagnostic Studies: Ct Chest W Contrast  06/11/2015  CLINICAL DATA:  Restaging colon cancer. EXAM: CT CHEST  WITH CONTRAST TECHNIQUE: Multidetector CT imaging of the chest was performed during intravenous contrast administration. CONTRAST:  37mL OMNIPAQUE IOHEXOL 300 MG/ML  SOLN COMPARISON:  CT abdomen pelvis 06/09/2015. FINDINGS: Mediastinum/Nodes: No pathologically enlarged mediastinal, hilar or axillary lymph nodes. Heart size normal. No pericardial effusion. Lungs/Pleura: Trace left pleural effusion. Trace paraseptal emphysema. Subsegmental atelectasis in the lower lobes, left greater than right. Peribronchovascular ground-glass nodularity in the left upper lobe (example series 5, image 23). Airway is unremarkable. Upper abdomen: Visualized portion of the liver is unremarkable. There may be vicarious excretion of contrast in the gallbladder. Visualized portions of the adrenal glands, kidneys, spleen, pancreas and stomach are grossly unremarkable. Dilated fluid-filled transverse colon with a mass at the junction of the transverse and descending colon (series 2, image 55). Small ascites. Musculoskeletal: No worrisome lytic or sclerotic lesions. IMPRESSION: 1. No evidence of metastatic disease in the chest. 2. Trace left pleural effusion with probable atelectasis in the left lower lobe. 3. Peribronchovascular ground-glass nodularity in the left upper lobe is likely infectious or inflammatory in etiology. 4. Fluid-filled and dilated transverse colon, indicative of obstruction related to a mass at the junction of the transverse and descending colon. Please refer to CT abdomen pelvis 06/09/2015 for further details. Electronically Signed   By: Lorin Picket M.D.   On: 06/11/2015 11:02   Ct Abdomen Pelvis W Contrast  06/09/2015  CLINICAL DATA:  Abdominal pain, leukocytosis, elevated lipase. EXAM: CT ABDOMEN AND PELVIS WITH CONTRAST TECHNIQUE: Multidetector CT imaging of the abdomen and pelvis was performed using the standard protocol following bolus administration of intravenous contrast. CONTRAST:  149mL OMNIPAQUE  IOHEXOL 300 MG/ML SOLN, 47mL OMNIPAQUE IOHEXOL 300 MG/ML SOLN COMPARISON:  03/11/2015 FINDINGS: Lower chest and abdominal wall:  No contributory findings. Hepatobiliary: No focal liver abnormality.No evidence of biliary obstruction or stone. Pancreas: Unremarkable. Spleen: Unremarkable. Adrenals/Urinary Tract: Negative adrenals. No hydronephrosis or stone. Mildly distended with mild circumferential bladder wall thickening, likely related to prostate enlargement. Reproductive:Symmetric enlargement of the prostate, stable. Stomach/Bowel: Gas dilated proximal and transverse colon with fluid level and patchy wall thickening, greatest along the proximal transverse segment where it is low-density submucosal edema. There is pericolonic fat edema, nodal enlargement, and small regional ascites. At the distal transverse segment there is caliber change with thick walled enhancing appearance, similar findings also seen on the previous study. No high-grade obstruction or perforation. No acute appendicitis. No skip inflammation identified. Vascular/Lymphatic: No acute vascular abnormality. Peritoneal: Small ascites along the right para colic gutter. Musculoskeletal: No acute abnormalities. 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 No sacroiliitis. IMPRESSION: 1. Proximal and transverse colitis. Appearance is nonspecific but chronic or recurrent disease compared to September 2016 CT increases chances of autoimmune colitis. 2. Persistent focal collapse and thickening of the distal transverse colon, recommend colonoscopy to evaluate for mass lesion. Electronically Signed   By: Monte Fantasia M.D.   On: 06/09/2015 06:05   Dg Chest [facility] [address]  06/10/2015  CLINICAL DATA:  Patient status post colonoscopy. Possible aspiration. EXAM: PORTABLE CHEST 1 VIEW COMPARISON:  Chest radiograph 03/11/2015. FINDINGS: Monitoring leads overlie the patient. Stable cardiac and mediastinal contours. Interval development of  heterogeneous opacities predominately within the left lower lobe. No pleural effusion or pneumothorax. IMPRESSION: Heterogeneous opacities left lung base may represent atelectasis, aspiration or infection. No pleural effusion or pneumothorax. Electronically Signed   By: Lovey Newcomer M.D.   On: 06/10/2015 14:10    Microbiology: Recent Results (from the past 240 hour(s))  C difficile quick scan w PCR reflex     Status: None   Collection Time: 06/11/15  8:55 AM  Result Value Ref Range Status   C Diff antigen NEGATIVE NEGATIVE Final   C Diff toxin NEGATIVE NEGATIVE Final   C Diff interpretation Negative for toxigenic C. difficile  Final  Surgical pcr screen     Status: None   Collection Time: 06/11/15 10:44 PM  Result Value Ref Range Status   MRSA, PCR NEGATIVE NEGATIVE Final   Staphylococcus aureus NEGATIVE NEGATIVE Final    Comment:        The Xpert SA Assay (FDA approved for NASAL specimens in patients over 47 years of age), is one component of a comprehensive surveillance program.  Test performance has been validated by [facility] for patients greater than or equal to 86 year old. It is not intended to diagnose infection nor to guide or monitor treatment.      Labs: Basic Metabolic Panel:  Recent Labs Lab 06/11/15 0520 06/13/15 0459 06/16/15 0530  NA 141 136 138  K 3.7 4.0 3.8  CL 106 99* 103  CO2 26 25 25   GLUCOSE 105* 119* 122*  BUN 7 8 11   CREATININE 0.74 0.59* 0.49*  CALCIUM 8.8* 9.0 8.4*  MG 1.9  --   --    Liver Function Tests: No results for input(s): AST, ALT, ALKPHOS, BILITOT, PROT, ALBUMIN in the last 168 hours. No results for input(s): LIPASE, AMYLASE in the last 168 hours. No results for input(s): AMMONIA in the last 168 hours. CBC:  Recent Labs Lab 06/11/15 0520 06/13/15 0459 06/16/15 0530  WBC 13.8* 14.1* 16.5*  HGB 12.7* 15.2 13.9  HCT 38.5* 44.8 41.8  MCV 92.8 89.4 90.7  PLT 217 245 273   Cardiac Enzymes: No results for input(s):  CKTOTAL, CKMB, CKMBINDEX, TROPONINI in the last 168 hours. BNP: BNP (last 3 results) No results for input(s): BNP in the last 8760 hours.  ProBNP (last 3 results) No results for input(s): PROBNP in the last 8760 hours.  CBG: No results for input(s): GLUCAP in the last 168 hours.  Signed:  Egon Dittus, Orpah Melter  [facility] 06/17/2015, 9:32 AM

## 2015-06-17 NOTE — Progress Notes (Signed)
Patient ID: OZELL STROJNY, male   DOB: 1971-06-17, 44 y.o.   MRN: [id number] 5 Days Post-Op  Subjective: Pt feels well this morning.  Tolerating a soft diet with no nausea or vomiting.  +BM  Objective: Vital signs in last 24 hours: Temp:  [98.2 F (36.8 C)-98.9 F (37.2 C)] 98.2 F (36.8 C) (12/27 0458) Pulse Rate:  [80-97] 90 (12/27 0458) Resp:  [18] 18 (12/27 0458) BP: (127-144)/(74-96) 137/80 mmHg (12/27 0458) SpO2:  [95 %-99 %] 99 % (12/27 0458) Last BM Date: 06/15/15  Intake/Output from previous day: 12/26 0701 - 12/27 0700 In: 3057.5 [P.O.:120; I.V.:2937.5] Out: -  Intake/Output this shift:    PE: Abd: soft, appropriately tender, +BS, ND, incision c/d/i with staples  Lab Results:   Recent Labs  06/16/15 0530  WBC 16.5*  HGB 13.9  HCT 41.8  PLT 273   BMET  Recent Labs  06/16/15 0530  NA 138  K 3.8  CL 103  CO2 25  GLUCOSE 122*  BUN 11  CREATININE 0.49*  CALCIUM 8.4*   PT/INR No results for input(s): LABPROT, INR in the last 72 hours. CMP     Component Value Date/Time   NA 138 06/16/2015 0530   K 3.8 06/16/2015 0530   CL 103 06/16/2015 0530   CO2 25 06/16/2015 0530   GLUCOSE 122* 06/16/2015 0530   BUN 11 06/16/2015 0530   CREATININE 0.49* 06/16/2015 0530   CREATININE 0.65 03/11/2015 1926   CALCIUM 8.4* 06/16/2015 0530   PROT 7.5 06/09/2015 0229   ALBUMIN 4.4 06/09/2015 0229   AST 18 06/09/2015 0229   ALT 17 06/09/2015 0229   ALKPHOS 76 06/09/2015 0229   BILITOT 0.4 06/09/2015 0229   GFRNONAA >60 06/16/2015 0530   GFRNONAA >89 03/11/2015 1926   GFRAA >60 06/16/2015 0530   GFRAA >89 03/11/2015 1926   Lipase     Component Value Date/Time   LIPASE 156* 06/09/2015 0229       Studies/Results: No results found.  Anti-infectives: Anti-infectives    Start     Dose/Rate Route Frequency Ordered Stop   06/12/15 0600  cefoTEtan (CEFOTAN) 2 g in dextrose 5 % 50 mL IVPB     2 g 100 mL/hr over 30 Minutes Intravenous 30 min pre-op 06/11/15  1237 06/12/15 1115   06/09/15 0830  metroNIDAZOLE (FLAGYL) tablet 500 mg  Status:  Discontinued     500 mg Oral 3 times per day 06/09/15 0825 06/11/15 1011   06/09/15 0230  ciprofloxacin (CIPRO) IVPB 400 mg     400 mg 200 mL/hr over 60 Minutes Intravenous  Once 06/09/15 0222 06/09/15 0345   06/09/15 0230  metroNIDAZOLE (FLAGYL) IVPB 500 mg     500 mg 100 mL/hr over 60 Minutes Intravenous  Once 06/09/15 0222 06/09/15 0603       Assessment/Plan  POD 5, s/p transverse and right colectomy for adenocarcinoma  -tolerating a soft diet.   -stable for DC home surgically  -will arrange staple removal and follow up appt.  -onc follow up being set up by primary service. -path still pending, but confirmed adenoca from colonoscopy biopsies and CEA 26.   LOS: 8 days    OSBORNE,KELLY E 06/17/2015, 8:51 AM Pager: [phone]  Agree with above. Home today.  Alphonsa Overall, MD, [facility] Surgery Pager: [phone] Office phone:  [phone]

## 2015-06-17 NOTE — Plan of Care (Signed)
Problem: Education: Goal: Knowledge of [facility] Education information/materials will improve Outcome: Completed/Met Date Met:  06/17/15 D/C summary reviewed at bedside. Reviewed follow up appointments. Pt understands home pain management. No further questions.

## 2015-06-17 NOTE — Discharge Instructions (Signed)

## 2015-06-17 NOTE — Progress Notes (Signed)
Pt VSS. IV removed. D/C summary reviewed at bedside, f/u appts reviewed, pain management reviewed, incision care discussed. No further questions from pt.

## 2015-06-18 ENCOUNTER — Telehealth: Payer: Self-pay | Admitting: *Deleted

## 2015-06-18 NOTE — Telephone Encounter (Signed)
Oncology Nurse Navigator Documentation  Oncology Nurse Navigator Flowsheets 06/18/2015  Referral date to RadOnc/MedOnc 06/12/2015  Navigator Encounter Type Introductory phone call  Spoke with patient and provided new patient appointment for 07/07/15 at 11:00 to see Dr. Truitt Merle. Informed of location of [place], valet service, and registration process. Reminded to bring insurance cards and a current medication list, including supplements. Patient verbalizes understanding. Mailed packet to home

## 2015-06-24 ENCOUNTER — Encounter (HOSPITAL_COMMUNITY): Payer: Self-pay

## 2015-07-07 ENCOUNTER — Encounter: Payer: Self-pay | Admitting: *Deleted

## 2015-07-07 ENCOUNTER — Ambulatory Visit (HOSPITAL_BASED_OUTPATIENT_CLINIC_OR_DEPARTMENT_OTHER): Payer: 59 | Admitting: Hematology

## 2015-07-07 ENCOUNTER — Encounter: Payer: 59 | Admitting: Hematology

## 2015-07-07 ENCOUNTER — Telehealth: Payer: Self-pay | Admitting: *Deleted

## 2015-07-07 VITALS — BP 134/84 | HR 77 | Temp 98.6°F | Resp 18 | Ht 68.0 in | Wt 130.3 lb

## 2015-07-07 DIAGNOSIS — C182 Malignant neoplasm of ascending colon: Secondary | ICD-10-CM

## 2015-07-07 DIAGNOSIS — C184 Malignant neoplasm of transverse colon: Secondary | ICD-10-CM | POA: Diagnosis not present

## 2015-07-07 DIAGNOSIS — C779 Secondary and unspecified malignant neoplasm of lymph node, unspecified: Secondary | ICD-10-CM

## 2015-07-07 DIAGNOSIS — Z720 Tobacco use: Secondary | ICD-10-CM

## 2015-07-07 MED ORDER — CAPECITABINE 150 MG PO TABS: 150.0000 mg | ORAL_TABLET | Freq: Two times a day (BID) | ORAL | Status: DC

## 2015-07-07 MED ORDER — CAPECITABINE 500 MG PO TABS: 1000.0000 mg/m2 | ORAL_TABLET | Freq: Two times a day (BID) | ORAL | Status: DC

## 2015-07-07 MED ORDER — ONDANSETRON HCL 8 MG PO TABS: 8.0000 mg | ORAL_TABLET | Freq: Two times a day (BID) | ORAL | Status: DC | PRN

## 2015-07-07 NOTE — Progress Notes (Signed)
Oncology Nurse Navigator Documentation  Oncology Nurse Navigator Flowsheets 07/07/2015  Navigator Location [facility]-Med Onc  Navigator Encounter Type Initial MedOnc  Abnormal Finding Date -  Confirmed Diagnosis Date -  Surgery Date -  Patient Visit Type MedOnc;Initial  Treatment Phase Pre-Tx/Tx Discussion  Barriers/Navigation Needs Family concerns;Education  Education Accessing Care/ Finding Providers;Understanding Cancer/ Treatment Options; Symptom Management;Newly Diagnosed Cancer Education;Preparing for Upcoming Treatment;Transport During Treatment (family member who understands English well)  Interventions Referrals;Education Method  Referrals Social Work--  Leisure centre manager;Teach-back (difficult)  Support Groups/Services GI Support Group  Acuity Level 4  Met with patient, wife, son, mother his brother during new patient visit. Explained the role of the GI Nurse Navigator and provided New Patient Packet with information on: 1. Colon cancer & info on Xeloda and Oxaliplatin 2. Support groups 3. Advanced Directives 4. Fall Safety Plan Answered questions, reviewed current treatment plan using TEACH back and provided emotional support. Provided copy of current treatment plan. English is limited for understanding in this family in regards to medical terminology. Was able to tell nurse he has cancer and needs to have chemo to try to keep it away. Will arrange for interpreter to be at his chemo teaching class and next office visit as well as 1st chemo tx. Will refer to CSW to determine if they are aware of any dentists in area that accept people without insurance and for any transportation assistance-maybe with ACS?  Merceda Elks, RN, BSN GI Oncology [place]

## 2015-07-07 NOTE — Progress Notes (Signed)
[facility]  [phone] [phone]  Clinic New Consult Note   No care team member to display 07/12/2015  REFERRAL PHYSICIAN: Dr. Carolynne Edouard   CHIEF COMPLAINTS/PURPOSE OF CONSULTATION:  Newly diagnosed stage III right colon cancer  Oncology History   Cancer of right colon [facility])   Staging form: Colon and Rectum, AJCC 7th Edition     Pathologic stage from 06/12/2015: Stage IIIC (T3, N2b, cM0) - Signed by Malachy Mood, MD on 07/07/2015       Cancer of right colon (HCC)   06/09/2015 Imaging CT chest abdomen and pelvis showed proximal and a transverse colitis, fluid-filled and dilated transverse colon indicating of obstruction related to a mass at the junction of the transverse and descending colon. trace left pleural effusion.    06/10/2015 Initial Diagnosis Cancer of right colon (HCC)   06/10/2015 Procedure Colonoscopy showed a friable ulcerated mucosa in distal to mid transverse with stricture, scope couldn't be advanced beyond the. 11 sessile polyps removed.   06/12/2015 Surgery Right hemicolectomy and lymph node dissection.   06/12/2015 Pathology Results Moderately differentiated colonic adenocarcinoma, 3 cm, G2, located in the transverse colon, LVI(-), perineural invasion (-), T3, margins were negative, 7 out of 30 lymph nodes were positive, polyps were tubular adenoma.    HISTORY OF PRESENTING ILLNESS:  John Moon 45 y.o. male is here because of His recently diagnosed stage III colon cancer. He is accompanied by his wife, half-brother, son, and mother to the clinic today.   He presented to [facility] emergency room with sudden onset abdominal pain and nausea for 2 days on 06/09/2015. CT scan reviewed follow-up suction and I palpable mass at the junction of the transverse and descending colon. He was admitted to the hospital, underwent colonoscopy on 06/10/2015, which showed a friable ulcerated mass in distal to mid transverse colon with structure, scope  was not able to advance beyond that. The biopsy of the colon mass showed adenocarcinoma. He also had at least 11 since I polyps removed it he underwent right hemicolectomy and lymph node dissection by Dr. Carolynne Edouard on 06/12/2015. He tolerated the surgery very well, was discharged home on 06/17/2015.   He has recovered well from his surgery, denies any pain, nausea, change of his bowel habits. He has good appetite and energy level. No other complaints. 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   MEDICAL HISTORY:  History reviewed. No pertinent past medical history.  SURGICAL HISTORY: Past Surgical History  Procedure Laterality Date  . Colonoscopy with propofol N/A 06/10/2015    Procedure: COLONOSCOPY WITH PROPOFOL;  Surgeon: Napoleon Form, MD;  Location: [facility] ENDOSCOPY;  Service: Endoscopy;  Laterality: N/A;  . Laparoscopic partial colectomy N/A 06/12/2015    Procedure: LAPAROSCOPIC ASSISTED  PARTIAL COLECTOMY;  Surgeon: Chevis Pretty III, MD;  Location: [facility] ORS;  Service: General;  Laterality: N/A;  . Partial colectomy  06/12/2015    Procedure: PARTIAL COLECTOMY;  Surgeon: Chevis Pretty III, MD;  Location: [facility] ORS;  Service: General;;    SOCIAL HISTORY: Social History   Social History  . Marital Status: Married     Spouse Name: N/A  . Number of Children: 2  . Years of Education: N/A   Occupational History  . Not on file.   Social History Main Topics  . Smoking status: Current Every Day Smoker -- 1.00 packs/day    Types: Cigarettes  .  Smokeless tobacco: Never Used  . Alcohol Use: 4.2 oz/week    2 Standard drinks or equivalent, 5 Cans of beer per week  . Drug Use: No  . Sexual Activity: No   Other Topics Concern  . 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 No pertinent family history. no family history of colon cancer or other malignancy.   ALLERGIES:  has No Known Allergies.  MEDICATIONS:  Current Outpatient Prescriptions  Medication Sig Dispense Refill  . oxyCODONE-acetaminophen (ROXICET) 5-325 MG tablet Take 1-2 tablets by mouth every 4 (four) hours as needed for severe pain. 30 tablet 0  . capecitabine (XELODA) 150 MG tablet Take 1 tablet (150 mg total) by mouth 2 (two) times daily after a meal. Take on days 1-14 of chemotherapy. 28 tablet 3  . capecitabine (XELODA) 500 MG tablet Take 3 tablets (1,500 mg total) by mouth 2 (two) times daily after a meal. Take on days 1-14 of chemotherapy. 84 tablet 3  . ondansetron (ZOFRAN) 8 MG tablet Take 1 tablet (8 mg total) by mouth 2 (two) times daily as needed for refractory nausea / vomiting. Start on day 3 after chemotherapy. 30 tablet 1   No current facility-administered medications for this visit.    REVIEW OF SYSTEMS:   Constitutional: Denies fevers, chills or abnormal night sweats Eyes: Denies blurriness of vision, double vision or watery eyes Ears, nose, mouth, throat, and face: Denies mucositis or sore throat Respiratory: Denies cough, dyspnea or wheezes Cardiovascular: Denies palpitation, chest discomfort or lower extremity swelling Gastrointestinal:  Denies nausea, heartburn or change in bowel habits Skin: Denies abnormal skin rashes Lymphatics: Denies new lymphadenopathy or easy bruising Neurological:Denies numbness, tingling or new weaknesses Behavioral/Psych: Mood is stable, no new changes  All other systems were reviewed with the patient and are negative.  PHYSICAL EXAMINATION: ECOG PERFORMANCE STATUS: 0 - Asymptomatic  Filed Vitals:   07/07/15 1555  BP: 134/84  Pulse: 77  Temp: 98.6 F (37 C)  Resp: 18   Filed Weights   07/07/15 1555  Weight: 130 lb 4.8 oz (59.104 kg)    GENERAL:alert, no distress and  comfortable SKIN: skin color, texture, turgor are normal, no rashes or significant lesions EYES: normal, conjunctiva are pink and non-injected, sclera clear OROPHARYNX:no exudate, no erythema and lips, buccal mucosa, and tongue normal  NECK: supple, thyroid normal size, non-tender, without nodularity LYMPH:  no palpable lymphadenopathy in the cervical, axillary or inguinal LUNGS: clear to auscultation and percussion with normal breathing effort HEART: regular rate & rhythm and no murmurs and no lower extremity edema ABDOMEN:abdomen soft, non-tender and normal bowel sounds, surgical incision sites are well healed. Musculoskeletal:no cyanosis of digits and no clubbing  PSYCH: alert & oriented x 3 with fluent speech NEURO: no focal motor/sensory deficits  LABORATORY DATA:  I have reviewed the data as listed CBC Latest Ref Rng 06/16/2015 06/13/2015 06/11/2015  WBC 4.0 - 10.5 K/uL 16.5(H) 14.1(H) 13.8(H)  Hemoglobin 13.0 - 17.0 g/dL 13.9 15.2 12.7(L)  Hematocrit 39.0 - 52.0 % 41.8 44.8 38.5(L)  Platelets 150 - 400 K/uL 273 245 217    Recent Labs  03/11/15 1926  03/11/15 2200  06/09/15 0229  06/11/15 0520 06/13/15 0459 06/16/15 0530  NA  137  --  138  < > 139  < > 141 136 138  K 4.0  --  4.0  < > 4.0  < > 3.7 4.0 3.8  CL 103  --  104  < > 104  < > 106 99* 103  CO2 23  --  24  --  23  < > '26 25 25  '$ GLUCOSE 99  --  112*  < > 131*  < > 105* 119* 122*  BUN 8  --  8  < > 11  < > '7 8 11  '$ CREATININE 0.65  --  0.60*  < > 0.66  < > 0.74 0.59* 0.49*  CALCIUM 9.5  --  9.6  --  9.1  < > 8.8* 9.0 8.4*  GFRNONAA >89  < > >60  --  >60  < > >60 >60 >60  GFRAA >89  < > >60  --  >60  < > >60 >60 >60  PROT 7.4  --  8.1  --  7.5  --   --   --   --   ALBUMIN 4.8  --  4.6  --  4.4  --   --   --   --   AST 12  --  20  --  18  --   --   --   --   ALT 17  --  18  --  17  --   --   --   --   ALKPHOS 91  --  80  --  76  --   --   --   --   BILITOT 0.4  --  0.6  --  0.4  --   --   --   --   < > = values  in this interval not displayed.  Pathology report Diagnosis 06/12/2015 Colon, segmental resection for tumor, transverse and right colon MODERATELY DIFFERENTIATED COLONIC ADENOCARCINOMA (3 CM) THE TUMOR INVADE THROUGH THE MUSCULARIS PROPRIA INTO PERICOLONIC TISSUE (T3) ALL MARGINS OF RESECTION ARE NEGATIVE FOR TUMOR METASTATIC COLONIC ADENOCARCINOMA IN SEVEN OF THIRTY LYMPH NODES (7/30, N2B) TUBULAR ADENOMA (X1) TUBULAR ADENOMA INVOLVING APPENDIX Microscopic Comment COLON AND RECTUM (INCLUDING TRANS-ANAL RESECTION): Specimen: Right and transverse colon Procedure: segmental resection Tumor site: Transverse colon Specimen integrity: Intact Macroscopic intactness of mesorectum: Not applicable: _ Macroscopic tumor perforation: Invading through the muscularis propria into pericolonic soft tissue Invasive tumor: Maximum size: 3 cm Histologic type(s): Adenocarcinoma Histologic grade and differentiation: G2 G1: well differentiated/low grade G2: moderately differentiated/low grade G3: poorly differentiated/high grade G4: undifferentiated/high grade Type of polyp in which invasive carcinoma arose: Tubular adenoma Microscopic extension of invasive tumor: Invading through the muscularis propria into pericolonic soft tissue Lymph-Vascular invasion: Negative Peri-neural invasion: Negative 1 of 3 FINAL for Santori, Elizabeth V ([id number]) Microscopic Comment(continued) Tumor deposit(s) (discontinuous extramural extension): Negative Resection margins: Proximal margin: negative Distal margin: negative Circumferential (radial) (posterior ascending, posterior descending; lateral and posterior mid-rectum; and entire lower 1/3 rectum): negative Mesenteric margin (sigmoid and transverse): negative Distance closest margin (if all above margins negative): 3.5 cm Treatment effect (neo-adjuvant therapy): Negative Additional polyp(s): Tubular adenoma Non-neoplastic findings: Unremarkable Lymph nodes:  number examined 30; number positive: 7 Pathologic Staging: T3, N2b, M_ Ancillary studies: Microsatelite instability PCR ordered Casimer Lanius MD Pathologist, Electronic Signature (Case signed 06/17/2015)    pathology  RADIOGRAPHIC STUDIES: I have personally reviewed the radiological images as listed and agreed with the findings in the report.  ENDOSCOPIC IMPRESSION: 06/10/2015 Friable ulcerated mucosa in distal to mid transverse with stricture, scope couldn't be advanced beyond it, ultiple biopsies taken from the edge of the mass. 11 sessile polyps removed   ASSESSMENT & PLAN:  45 year old [place] male, without significant past medical history, presented with biopsy action.  Colonoscopy showed a ulcerative mass in the transverse colon, and multiple polyps.   1. Right colon adenocarcinoma, pT3N2bM0, stage IIIB, MMR normal -I reviewed his CT scan and surgical path in great details, including the staging, and molecular features.  -I reviewed the nature history of colon cancer, and risk of cancer recurrence after surgery. Due to his locally advanced disease, especially multiple (7) positive nodes, his risk of recurrence is very high.  -I recommend adjuvant chemotherapy to reduce his risk of recurrence, with standard regimen FOLFOX or CAPEOX, for a total of 6 months. The benefit, potential side effects and treatment logistics were discussed with him and his family in details, and he opted CAPEOX.  --Chemotherapy consent: Side effects including but does not not limited to, fatigue, nausea, vomiting, diarrhea, hair loss, neuropathy, fluid retention, renal and kidney dysfunction, neutropenic fever, needed for blood transfusion, bleeding, coronary artery disease and heart attack, were discussed with patient in great detail. He voiced good understanding and agrees to proceed. -Pt does not speak English and did not ask many questions, I asked him to say back what I told him, and he was able to  repeat the main points.  -I discussed the surveillance plan, which is a physical exam and lab test (including CBC, CMP and CEA) every 3 months for the first 2 years, then every 6-12 months, colonoscopy in one year, and surveilliance CT scan every 6-12 month for up to 5 year.   Plan -chemo class -I will send a prescription to [facility] today -I will tentatively start his chemo in 2 weeks  -I will see him back in next week, to go over chemo with him   All questions were answered. The patient knows to call the clinic with any problems, questions or concerns.   I spent 55 minutes counseling the patient face to face. The total time spent in the appointment was 60 minutes and more than 50% was on counseling.     Truitt Merle, MD 07/07/2015

## 2015-07-07 NOTE — Progress Notes (Signed)
This encounter was created in error - please disregard.  This encounter was created in error - please disregard.

## 2015-07-07 NOTE — Telephone Encounter (Signed)
  Oncology Nurse Navigator Documentation  Navigator Location: [facility] (07/07/15 1136) Navigator Encounter Type: Introductory phone call (07/07/15 1136) : Called to follow up on his appointment-currently 30 minutes late. He reports he thought his appointment was tomorrow. Says someone called him to change it. Confirmed to him this is a mistake, could have been another office (He does have an appointment on 1/17 at 12:30 with Dr. Marlou Starks). Per Dr. Burr Medico, she will see him today at 4 pm. He agrees to come.   Abnormal Finding Date: 06/10/15 (07/07/15 1136) Confirmed Diagnosis Date: 06/10/15 (07/07/15 1136) Surgery Date: 06/12/15 (07/07/15 1136)

## 2015-07-09 ENCOUNTER — Telehealth: Payer: Self-pay | Admitting: Pharmacist

## 2015-07-09 ENCOUNTER — Telehealth: Payer: Self-pay | Admitting: Hematology

## 2015-07-09 ENCOUNTER — Other Ambulatory Visit: Payer: 59

## 2015-07-09 NOTE — Telephone Encounter (Signed)
per pof to sch pt appt-sent MW email to sch trmt-pt to get updated copy of sch @ chemo class 1/25

## 2015-07-09 NOTE — Telephone Encounter (Signed)
07/09/15: New Rx for Xeloda faxed to [facility], [place]

## 2015-07-11 ENCOUNTER — Telehealth: Payer: Self-pay | Admitting: Pharmacist

## 2015-07-11 NOTE — Telephone Encounter (Signed)
07/11/15: Rx for Xeloda requires specialty pharmacy per [employer]. Faxed to [facility] at [phone]

## 2015-07-12 ENCOUNTER — Encounter: Payer: Self-pay | Admitting: Hematology

## 2015-07-16 ENCOUNTER — Encounter: Payer: Self-pay | Admitting: *Deleted

## 2015-07-16 ENCOUNTER — Other Ambulatory Visit: Payer: Self-pay | Admitting: *Deleted

## 2015-07-16 ENCOUNTER — Encounter: Payer: Self-pay | Admitting: Hematology

## 2015-07-16 ENCOUNTER — Other Ambulatory Visit: Payer: 59

## 2015-07-16 DIAGNOSIS — C182 Malignant neoplasm of ascending colon: Secondary | ICD-10-CM

## 2015-07-16 MED ORDER — PROCHLORPERAZINE MALEATE 10 MG PO TABS: 10.0000 mg | ORAL_TABLET | Freq: Four times a day (QID) | ORAL | Status: DC | PRN

## 2015-07-16 NOTE — Progress Notes (Signed)
Introduced myself as his FA.  Explained that unfortunately there aren't any foundations that are offering copay assistance for his Dx, but I will keep checking and once funds are available I will apply in his behalf.  I informed him of the $400 [place].  Pt is approved for the grant by providing a letter of support since he is no longer working.  Pt has my card for any questions or concerns he may have in the future.

## 2015-07-17 ENCOUNTER — Other Ambulatory Visit (HOSPITAL_BASED_OUTPATIENT_CLINIC_OR_DEPARTMENT_OTHER): Payer: 59

## 2015-07-17 ENCOUNTER — Telehealth: Payer: Self-pay | Admitting: Hematology

## 2015-07-17 ENCOUNTER — Ambulatory Visit (HOSPITAL_BASED_OUTPATIENT_CLINIC_OR_DEPARTMENT_OTHER): Payer: 59 | Admitting: Hematology

## 2015-07-17 ENCOUNTER — Encounter: Payer: Self-pay | Admitting: Hematology

## 2015-07-17 ENCOUNTER — Telehealth: Payer: Self-pay | Admitting: *Deleted

## 2015-07-17 VITALS — BP 124/76 | HR 70 | Temp 98.5°F | Resp 20 | Ht 68.0 in | Wt 130.9 lb

## 2015-07-17 DIAGNOSIS — C182 Malignant neoplasm of ascending colon: Secondary | ICD-10-CM

## 2015-07-17 DIAGNOSIS — R740 Nonspecific elevation of levels of transaminase and lactic acid dehydrogenase [LDH]: Secondary | ICD-10-CM

## 2015-07-17 DIAGNOSIS — C779 Secondary and unspecified malignant neoplasm of lymph node, unspecified: Secondary | ICD-10-CM

## 2015-07-17 DIAGNOSIS — C184 Malignant neoplasm of transverse colon: Secondary | ICD-10-CM | POA: Diagnosis not present

## 2015-07-17 LAB — CBC WITH DIFFERENTIAL/PLATELET
BASO%: 0.5 % (ref 0.0–2.0)
BASOS ABS: 0.1 10*3/uL (ref 0.0–0.1)
EOS ABS: 0.4 10*3/uL (ref 0.0–0.5)
EOS%: 4.2 % (ref 0.0–7.0)
HEMATOCRIT: 41.7 % (ref 38.4–49.9)
HEMOGLOBIN: 14 g/dL (ref 13.0–17.1)
LYMPH#: 3.1 10*3/uL (ref 0.9–3.3)
LYMPH%: 32.4 % (ref 14.0–49.0)
MCH: 29.7 pg (ref 27.2–33.4)
MCHC: 33.5 g/dL (ref 32.0–36.0)
MCV: 88.8 fL (ref 79.3–98.0)
MONO#: 0.8 10*3/uL (ref 0.1–0.9)
MONO%: 8.4 % (ref 0.0–14.0)
NEUT#: 5.2 10*3/uL (ref 1.5–6.5)
NEUT%: 54.5 % (ref 39.0–75.0)
PLATELETS: 214 10*3/uL (ref 140–400)
RBC: 4.7 10*6/uL (ref 4.20–5.82)
RDW: 13.1 % (ref 11.0–14.6)
WBC: 9.6 10*3/uL (ref 4.0–10.3)

## 2015-07-17 LAB — COMPREHENSIVE METABOLIC PANEL
ALBUMIN: 4.1 g/dL (ref 3.5–5.0)
ALK PHOS: 82 U/L (ref 40–150)
ALT: 100 U/L — ABNORMAL HIGH (ref 0–55)
AST: 44 U/L — AB (ref 5–34)
Anion Gap: 10 mEq/L (ref 3–11)
BUN: 12.1 mg/dL (ref 7.0–26.0)
CALCIUM: 9.6 mg/dL (ref 8.4–10.4)
CO2: 27 mEq/L (ref 22–29)
Chloride: 104 mEq/L (ref 98–109)
Creatinine: 0.8 mg/dL (ref 0.7–1.3)
Glucose: 88 mg/dl (ref 70–140)
POTASSIUM: 4.1 meq/L (ref 3.5–5.1)
Sodium: 141 mEq/L (ref 136–145)
Total Bilirubin: <0.3 mg/dL (ref 0.20–1.20)
Total Protein: 7.7 g/dL (ref 6.4–8.3)

## 2015-07-17 LAB — TECHNOLOGIST REVIEW

## 2015-07-17 NOTE — Telephone Encounter (Signed)
per pof to sch pt appt-sent MW email to see if inf on 2/20 could be moved to coordinate w/MD appt-pt to get updated copy on 07/21/15

## 2015-07-17 NOTE — Telephone Encounter (Signed)
Per staff message and POF I have scheduled appts. Advised scheduler of appts. JMW  

## 2015-07-17 NOTE — Progress Notes (Signed)
[place]  [phone] [phone]  Clinic follow Up Note   No care team member to display 07/17/2015  REFERRAL PHYSICIAN: Dr. Marlou Starks   CHIEF COMPLAINTS/PURPOSE OF CONSULTATION:  Newly diagnosed stage III right colon cancer  Oncology History   Cancer of right colon [facility])   Staging form: Colon and Rectum, AJCC 7th Edition     Pathologic stage from 06/12/2015: Stage IIIC (T3, N2b, cM0) - Signed by Truitt Merle, MD on 07/07/2015       Cancer of right colon ([place])   06/09/2015 Imaging CT chest abdomen and pelvis showed proximal and a transverse colitis, fluid-filled and dilated transverse colon indicating of obstruction related to a mass at the junction of the transverse and descending colon. trace left pleural effusion.    06/10/2015 Initial Diagnosis Cancer of right colon ([place])   06/10/2015 Procedure Colonoscopy showed a friable ulcerated mucosa in distal to mid transverse with stricture, scope couldn't be advanced beyond the. 11 sessile polyps removed.   06/12/2015 Surgery Right hemicolectomy and lymph node dissection.   06/12/2015 Pathology Results Moderately differentiated colonic adenocarcinoma, 3 cm, G2, located in the transverse colon, LVI(-), perineural invasion (-), T3, margins were negative, 7 out of 30 lymph nodes were positive, polyps were tubular adenoma.    HISTORY OF PRESENTING ILLNESS:  John Moon 45 y.o. male is here because of His recently diagnosed stage III colon cancer. He is accompanied by his wife, half-brother, son, and mother to the clinic today.   He presented to [facility] emergency room with sudden onset abdominal pain and nausea for 2 days on 06/09/2015. CT scan reviewed follow-up suction and I palpable mass at the junction of the transverse and descending colon. He was admitted to the hospital, underwent colonoscopy on 06/10/2015, which showed a friable ulcerated mass in distal to mid transverse colon with structure, scope  was not able to advance beyond that. The biopsy of the colon mass showed adenocarcinoma. He also had at least 11 since I polyps removed it he underwent right hemicolectomy and lymph node dissection by Dr. Marlou Starks on 06/12/2015. He tolerated the surgery very well, was discharged home on 06/17/2015.   He has recovered well from his surgery, denies any pain, nausea, change of his bowel habits. He has good appetite and energy level. No other complaints. 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   CURRENT THERAPY: pending adjuvant chemo Xeloda '1650mg'$  bid on day 1-14, and oxaliplatin on day 1, every 21 days, starting on 07/21/2015  INTERIM HISTORY: Nil returns for follow up. He is clinically doing very well, denies any pain, or other symptoms. He plans to return to work next week. He is accompanied by his wife to the clinic today.  MEDICAL HISTORY:  History reviewed. No pertinent past medical history.  SURGICAL HISTORY: Past Surgical History  Procedure Laterality Date  . Colonoscopy with propofol N/A 06/10/2015    Procedure: COLONOSCOPY WITH PROPOFOL;  Surgeon: Mauri Pole, MD;  Location: [facility] ENDOSCOPY;  Service: Endoscopy;  Laterality: N/A;  . Laparoscopic partial colectomy N/A 06/12/2015    Procedure: LAPAROSCOPIC ASSISTED  PARTIAL COLECTOMY;  Surgeon: Autumn Messing III, MD;  Location: [facility] ORS;  Service: General;  Laterality: N/A;  . Partial colectomy  06/12/2015    Procedure: PARTIAL COLECTOMY;  Surgeon: Autumn Messing III, MD;  Location: [facility] ORS;  Service: General;;    SOCIAL HISTORY: Social History  Social History  . Marital Status: Married     Spouse Name: N/A  . Number of Children: 2  . Years of Education: N/A   Occupational History  . Not on file.   Social History Main Topics  . Smoking status: Current Every Day Smoker -- 1.00 packs/day    Types: Cigarettes  . Smokeless tobacco: Never Used  . Alcohol Use: 4.2 oz/week      2 Standard drinks or equivalent, 5 Cans of beer per week  . Drug Use: No  . Sexual Activity: No   Other Topics Concern  . 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 No pertinent family history. no family history of colon cancer or other malignancy.   ALLERGIES:  has No Known Allergies.  MEDICATIONS:  Current Outpatient Prescriptions  Medication Sig Dispense Refill  . capecitabine (XELODA) 150 MG tablet Take 1 tablet (150 mg total) by mouth 2 (two) times daily after a meal. Take on days 1-14 of chemotherapy. 28 tablet 3  . capecitabine (XELODA) 500 MG tablet Take 3 tablets (1,500 mg total) by mouth 2 (two) times daily after a meal. Take on days 1-14 of chemotherapy. 84 tablet 3  . oxyCODONE-acetaminophen (ROXICET) 5-325 MG tablet Take 1-2 tablets by mouth every 4 (four) hours as needed for severe pain. 30 tablet 0  . ondansetron (ZOFRAN) 8 MG tablet Take 1 tablet (8 mg total) by mouth 2 (two) times daily as needed for refractory nausea / vomiting. Start on day 3 after chemotherapy. (Patient not taking: Reported on 07/17/2015) 30 tablet 1  . prochlorperazine (COMPAZINE) 10 MG tablet Take 1 tablet (10 mg total) by mouth every 6 (six) hours as needed (Nausea or vomiting). (Patient not taking: Reported on 07/17/2015) 30 tablet 1   No current facility-administered medications for this visit.    REVIEW OF SYSTEMS:   Constitutional: Denies fevers, chills or abnormal night sweats Eyes: Denies blurriness of vision, double vision or watery eyes Ears, nose, mouth, throat, and face: Denies mucositis or sore throat Respiratory: Denies cough, dyspnea or wheezes Cardiovascular: Denies palpitation, chest discomfort or lower extremity swelling Gastrointestinal:  Denies  nausea, heartburn or change in bowel habits Skin: Denies abnormal skin rashes Lymphatics: Denies new lymphadenopathy or easy bruising Neurological:Denies numbness, tingling or new weaknesses Behavioral/Psych: Mood is stable, no new changes  All other systems were reviewed with the patient and are negative.  PHYSICAL EXAMINATION: ECOG PERFORMANCE STATUS: 0 - Asymptomatic  Filed Vitals:   07/17/15 1149  BP: 124/76  Pulse: 70  Temp: 98.5 F (36.9 C)  Resp: 20   Filed Weights   07/17/15 1149  Weight: 130 lb 14.4 oz (59.376 kg)    GENERAL:alert, no distress and comfortable SKIN: skin color, texture, turgor are normal, no rashes or significant lesions EYES: normal, conjunctiva are pink and non-injected, sclera clear OROPHARYNX:no exudate, no erythema and lips, buccal mucosa, and tongue normal  NECK: supple, thyroid normal size, non-tender, without nodularity LYMPH:  no palpable lymphadenopathy in the cervical, axillary or inguinal LUNGS: clear to auscultation and percussion with normal breathing effort HEART: regular rate & rhythm and no murmurs and no lower extremity edema ABDOMEN:abdomen soft, non-tender and normal bowel sounds, surgical incision sites are well healed.  Musculoskeletal:no cyanosis of digits and no clubbing  PSYCH: alert & oriented x 3 with fluent speech NEURO: no focal motor/sensory deficits  LABORATORY DATA:  I have reviewed the data as listed CBC Latest Ref Rng 07/17/2015 06/16/2015 06/13/2015  WBC 4.0 - 10.3 10e3/uL 9.6 16.5(H) 14.1(H)  Hemoglobin 13.0 - 17.1 g/dL 14.0 13.9 15.2  Hematocrit 38.4 - 49.9 % 41.7 41.8 44.8  Platelets 140 - 400 10e3/uL 214 273 245    Recent Labs  03/11/15 2200  06/09/15 0229  06/11/15 0520 06/13/15 0459 06/16/15 0530 07/17/15 1038  NA 138  < > 139  < > 141 136 138 141  K 4.0  < > 4.0  < > 3.7 4.0 3.8 4.1  CL 104  < > 104  < > 106 99* 103  --   CO2 24  --  23  < > '26 25 25 27  '$ GLUCOSE 112*  < > 131*  < > 105* 119* 122*  88  BUN 8  < > 11  < > '7 8 11 '$ 12.1  CREATININE 0.60*  < > 0.66  < > 0.74 0.59* 0.49* 0.8  CALCIUM 9.6  --  9.1  < > 8.8* 9.0 8.4* 9.6  GFRNONAA >60  --  >60  < > >60 >60 >60  --   GFRAA >60  --  >60  < > >60 >60 >60  --   PROT 8.1  --  7.5  --   --   --   --  7.7  ALBUMIN 4.6  --  4.4  --   --   --   --  4.1  AST 20  --  18  --   --   --   --  44*  ALT 18  --  17  --   --   --   --  100*  ALKPHOS 80  --  76  --   --   --   --  82  BILITOT 0.6  --  0.4  --   --   --   --  <0.30  < > = values in this interval not displayed.  Pathology report Diagnosis 06/12/2015 Colon, segmental resection for tumor, transverse and right colon MODERATELY DIFFERENTIATED COLONIC ADENOCARCINOMA (3 CM) THE TUMOR INVADE THROUGH THE MUSCULARIS PROPRIA INTO PERICOLONIC TISSUE (T3) ALL MARGINS OF RESECTION ARE NEGATIVE FOR TUMOR METASTATIC COLONIC ADENOCARCINOMA IN SEVEN OF THIRTY LYMPH NODES (7/30, N2B) TUBULAR ADENOMA (X1) TUBULAR ADENOMA INVOLVING APPENDIX Microscopic Comment COLON AND RECTUM (INCLUDING TRANS-ANAL RESECTION): Specimen: Right and transverse colon Procedure: segmental resection Tumor site: Transverse colon Specimen integrity: Intact Macroscopic intactness of mesorectum: Not applicable: _ Macroscopic tumor perforation: Invading through the muscularis propria into pericolonic soft tissue Invasive tumor: Maximum size: 3 cm Histologic type(s): Adenocarcinoma Histologic grade and differentiation: G2 G1: well differentiated/low grade G2: moderately differentiated/low grade G3: poorly differentiated/high grade G4: undifferentiated/high grade Type of polyp in which invasive carcinoma arose: Tubular adenoma Microscopic extension of invasive tumor: Invading through the muscularis propria into pericolonic soft tissue Lymph-Vascular invasion: Negative Peri-neural invasion: Negative 1 of 3 FINAL for Marrow, Rakesh V ([id number]) Microscopic Comment(continued) Tumor deposit(s) (discontinuous  extramural extension): Negative Resection margins: Proximal margin: negative Distal margin: negative Circumferential (radial) (posterior ascending, posterior descending; lateral and posterior mid-rectum; and entire lower 1/3 rectum): negative Mesenteric margin (sigmoid and transverse): negative Distance closest margin (if all above margins negative): 3.5 cm Treatment effect (neo-adjuvant therapy): Negative Additional polyp(s): Tubular adenoma Non-neoplastic findings:  Unremarkable Lymph nodes: number examined 30; number positive: 7 Pathologic Staging: T3, N2b, M_ Ancillary studies: Microsatelite instability PCR ordered Casimer Lanius MD Pathologist, Electronic Signature (Case signed 06/17/2015)    pathology  RADIOGRAPHIC STUDIES: I have personally reviewed the radiological images as listed and agreed with the findings in the report.    ENDOSCOPIC IMPRESSION: 06/10/2015 Friable ulcerated mucosa in distal to mid transverse with stricture, scope couldn't be advanced beyond it, ultiple biopsies taken from the edge of the mass. 11 sessile polyps removed   ASSESSMENT & PLAN:  45 year old [place] male, without significant past medical history, presented with biopsy action.  Colonoscopy showed a ulcerative mass in the transverse colon, and multiple polyps.   1. Right colon adenocarcinoma, pT3N2bM0, stage IIIB, MSI-stable  -I reviewed his CT scan and surgical path in great details, including the staging, and molecular features.  -I reviewed the nature history of colon cancer, and risk of cancer recurrence after surgery. Due to his locally advanced disease, especially multiple (7) positive nodes, his risk of recurrence is very high.  -I recommend adjuvant chemotherapy to reduce his risk of recurrence, with standard regimen FOLFOX or CAPEOX, for a total of 6 months. The benefit, potential side effects and treatment logistics were discussed with him and his family in details, and he opted  CAPEOX.  -He has attended the chemotherapy class, and I reviewed the major side effects again with him and his wife today. He agrees to start next week. He is Xeloda is scheduled to be delivered today or tomorrow. -Lab results reviewed with him, he has mild transaminitis, we'll repeat before his first cycle of chemotherapy. We'll proceed with cycle chemotherapy next Monday, if transaminitis stable or improves. -I discussed the surveillance plan, which is a physical exam and lab test (including CBC, CMP and CEA) every 3 months for the first 2 years, then every 6-12 months, colonoscopy in one year, and surveilliance CT scan every 6-12 month for up to 5 year.   2. Transaminitis -He has no history of liver disease, only drinks alcohol occasionally. His CT scan on 06/09/15 showed a normal liver. -I'll check hepatitis B and hepatitis C on next blood draw -We'll monitor his liver function closely when he is on chemotherapy.  Plan -Repeat CMP on 1/30 before chemotherapy, no need to repeat CBC  -I will determinate if I need to modify his chemotherapy based on his liver function. He will start Xeloda on 1/30 night.  -I'll see him back one week after his chemotherapy for a toxicity check up.  All questions were answered. The patient knows to call the clinic with any problems, questions or concerns.   I spent 25 minutes counseling the patient face to face. The total time spent in the appointment was 30 minutes and more than 50% was on counseling.     Truitt Merle, MD 07/17/2015

## 2015-07-21 ENCOUNTER — Other Ambulatory Visit: Payer: 59

## 2015-07-21 ENCOUNTER — Other Ambulatory Visit: Payer: Self-pay | Admitting: *Deleted

## 2015-07-21 ENCOUNTER — Ambulatory Visit: Payer: 59

## 2015-07-21 ENCOUNTER — Other Ambulatory Visit: Payer: Self-pay | Admitting: Hematology

## 2015-07-21 ENCOUNTER — Telehealth: Payer: Self-pay | Admitting: Hematology

## 2015-07-21 ENCOUNTER — Other Ambulatory Visit (HOSPITAL_BASED_OUTPATIENT_CLINIC_OR_DEPARTMENT_OTHER): Payer: 59

## 2015-07-21 DIAGNOSIS — C182 Malignant neoplasm of ascending colon: Secondary | ICD-10-CM | POA: Diagnosis not present

## 2015-07-21 DIAGNOSIS — C189 Malignant neoplasm of colon, unspecified: Secondary | ICD-10-CM

## 2015-07-21 LAB — COMPREHENSIVE METABOLIC PANEL
ALT: 129 U/L — AB (ref 0–55)
AST: 51 U/L — ABNORMAL HIGH (ref 5–34)
Albumin: 3.9 g/dL (ref 3.5–5.0)
Alkaline Phosphatase: 76 U/L (ref 40–150)
Anion Gap: 11 mEq/L (ref 3–11)
BUN: 12 mg/dL (ref 7.0–26.0)
CO2: 24 meq/L (ref 22–29)
CREATININE: 0.7 mg/dL (ref 0.7–1.3)
Calcium: 9.2 mg/dL (ref 8.4–10.4)
Chloride: 107 mEq/L (ref 98–109)
EGFR: >90 mL/min/{1.73_m2} (ref >90)
GLUCOSE: 112 mg/dL (ref 70–140)
Potassium: 3.8 mEq/L (ref 3.5–5.1)
SODIUM: 142 meq/L (ref 136–145)
TOTAL PROTEIN: 7.4 g/dL (ref 6.4–8.3)

## 2015-07-21 NOTE — Telephone Encounter (Signed)
PER 1/30 POF TX FOR TODAY MOVED TO NEXT WEEK WITH F/U PRIOR TO TX - PER YF SHE WILL SEE PT 2/9 @ 10:45 AM.

## 2015-07-22 LAB — HEPATITIS B CORE ANTIBODY, TOTAL: Hep B Core Ab, Tot: POSITIVE — AB

## 2015-07-22 LAB — HEPATITIS B SURFACE ANTIBODY,QUALITATIVE: HEP B SURFACE AB, QUAL: REACTIVE

## 2015-07-22 LAB — HEPATITIS C ANTIBODY: HEP C VIRUS AB: 0.2 {s_co_ratio} (ref 0.0–0.9)

## 2015-07-22 LAB — HEPATITIS B SURFACE ANTIGEN: HEP B S AG: NEGATIVE

## 2015-07-29 ENCOUNTER — Other Ambulatory Visit: Payer: 59

## 2015-07-29 ENCOUNTER — Ambulatory Visit: Payer: 59 | Admitting: Hematology

## 2015-07-30 ENCOUNTER — Other Ambulatory Visit: Payer: Self-pay | Admitting: *Deleted

## 2015-07-30 DIAGNOSIS — C182 Malignant neoplasm of ascending colon: Secondary | ICD-10-CM

## 2015-07-31 ENCOUNTER — Ambulatory Visit (HOSPITAL_BASED_OUTPATIENT_CLINIC_OR_DEPARTMENT_OTHER): Payer: 59

## 2015-07-31 ENCOUNTER — Other Ambulatory Visit (HOSPITAL_BASED_OUTPATIENT_CLINIC_OR_DEPARTMENT_OTHER): Payer: 59

## 2015-07-31 ENCOUNTER — Encounter: Payer: Self-pay | Admitting: Hematology

## 2015-07-31 ENCOUNTER — Ambulatory Visit (HOSPITAL_COMMUNITY)
Admission: RE | Admit: 2015-07-31 | Discharge: 2015-07-31 | Disposition: A | Discharge status: Home / Self Care | Payer: 59 | Source: Ambulatory Visit | Attending: Hematology | Admitting: Hematology

## 2015-07-31 ENCOUNTER — Ambulatory Visit (HOSPITAL_BASED_OUTPATIENT_CLINIC_OR_DEPARTMENT_OTHER): Payer: 59 | Admitting: Hematology

## 2015-07-31 ENCOUNTER — Telehealth: Payer: Self-pay | Admitting: Hematology

## 2015-07-31 ENCOUNTER — Telehealth: Payer: Self-pay | Admitting: *Deleted

## 2015-07-31 VITALS — BP 124/77 | HR 87 | Temp 98.0°F | Resp 18 | Ht 68.0 in | Wt 138.1 lb

## 2015-07-31 DIAGNOSIS — C182 Malignant neoplasm of ascending colon: Secondary | ICD-10-CM

## 2015-07-31 DIAGNOSIS — K760 Fatty (change of) liver, not elsewhere classified: Secondary | ICD-10-CM | POA: Diagnosis not present

## 2015-07-31 DIAGNOSIS — R7989 Other specified abnormal findings of blood chemistry: Secondary | ICD-10-CM | POA: Insufficient documentation

## 2015-07-31 DIAGNOSIS — R740 Nonspecific elevation of levels of transaminase and lactic acid dehydrogenase [LDH]: Secondary | ICD-10-CM

## 2015-07-31 DIAGNOSIS — C184 Malignant neoplasm of transverse colon: Secondary | ICD-10-CM | POA: Diagnosis not present

## 2015-07-31 DIAGNOSIS — C779 Secondary and unspecified malignant neoplasm of lymph node, unspecified: Secondary | ICD-10-CM

## 2015-07-31 DIAGNOSIS — Z9049 Acquired absence of other specified parts of digestive tract: Secondary | ICD-10-CM | POA: Diagnosis not present

## 2015-07-31 DIAGNOSIS — Z5111 Encounter for antineoplastic chemotherapy: Secondary | ICD-10-CM | POA: Diagnosis not present

## 2015-07-31 LAB — COMPREHENSIVE METABOLIC PANEL
ALBUMIN: 3.9 g/dL (ref 3.5–5.0)
ALK PHOS: 77 U/L (ref 40–150)
ALT: 82 U/L — ABNORMAL HIGH (ref 0–55)
AST: 32 U/L (ref 5–34)
Anion Gap: 12 mEq/L — ABNORMAL HIGH (ref 3–11)
BUN: 9 mg/dL (ref 7.0–26.0)
CO2: 21 meq/L — AB (ref 22–29)
Calcium: 9.2 mg/dL (ref 8.4–10.4)
Chloride: 108 mEq/L (ref 98–109)
Creatinine: 0.8 mg/dL (ref 0.7–1.3)
EGFR: >90 mL/min/{1.73_m2} (ref >90)
GLUCOSE: 119 mg/dL (ref 70–140)
POTASSIUM: 3.7 meq/L (ref 3.5–5.1)
SODIUM: 141 meq/L (ref 136–145)
TOTAL PROTEIN: 7.3 g/dL (ref 6.4–8.3)
Total Bilirubin: 0.36 mg/dL (ref 0.20–1.20)

## 2015-07-31 LAB — CBC WITH DIFFERENTIAL/PLATELET
BASO%: 0.5 % (ref 0.0–2.0)
Basophils Absolute: 0 10*3/uL (ref 0.0–0.1)
EOS%: 3.7 % (ref 0.0–7.0)
Eosinophils Absolute: 0.3 10*3/uL (ref 0.0–0.5)
HCT: 41.7 % (ref 38.4–49.9)
HEMOGLOBIN: 13.8 g/dL (ref 13.0–17.1)
LYMPH#: 2.8 10*3/uL (ref 0.9–3.3)
LYMPH%: 40.1 % (ref 14.0–49.0)
MCH: 29.7 pg (ref 27.2–33.4)
MCHC: 33.1 g/dL (ref 32.0–36.0)
MCV: 89.7 fL (ref 79.3–98.0)
MONO#: 0.5 10*3/uL (ref 0.1–0.9)
MONO%: 7.9 % (ref 0.0–14.0)
NEUT%: 47.8 % (ref 39.0–75.0)
NEUTROS ABS: 3.3 10*3/uL (ref 1.5–6.5)
Platelets: 203 10*3/uL (ref 140–400)
RBC: 4.65 10*6/uL (ref 4.20–5.82)
RDW: 13.5 % (ref 11.0–14.6)
WBC: 6.9 10*3/uL (ref 4.0–10.3)

## 2015-07-31 MED ORDER — OXALIPLATIN CHEMO INJECTION 100 MG/20ML
130.0000 mg/m2 | Freq: Once | INTRAVENOUS | Status: AC
  Administered 2015-07-31: 220 mg via INTRAVENOUS
  Filled 2015-07-31: qty 34

## 2015-07-31 MED ORDER — IOHEXOL 300 MG/ML  SOLN
50.0000 mL | Freq: Once | INTRAMUSCULAR | Status: AC | PRN
  Administered 2015-07-31: 50 mL via ORAL

## 2015-07-31 MED ORDER — PALONOSETRON HCL INJECTION 0.25 MG/5ML
0.2500 mg | Freq: Once | INTRAVENOUS | Status: AC
  Administered 2015-07-31: 0.25 mg via INTRAVENOUS

## 2015-07-31 MED ORDER — SODIUM CHLORIDE 0.9 % IV SOLN
10.0000 mg | Freq: Once | INTRAVENOUS | Status: AC
  Administered 2015-07-31: 10 mg via INTRAVENOUS
  Filled 2015-07-31: qty 1

## 2015-07-31 MED ORDER — DEXTROSE 5 % IV SOLN
Freq: Once | INTRAVENOUS | Status: AC
  Administered 2015-07-31: 12:00:00 via INTRAVENOUS

## 2015-07-31 MED ORDER — IOHEXOL 300 MG/ML  SOLN
100.0000 mL | Freq: Once | INTRAMUSCULAR | Status: AC | PRN
  Administered 2015-07-31: 100 mL via INTRAVENOUS

## 2015-07-31 MED ORDER — PALONOSETRON HCL INJECTION 0.25 MG/5ML
INTRAVENOUS | Status: AC
  Filled 2015-07-31: qty 5

## 2015-07-31 NOTE — Patient Instructions (Signed)
[facility] Discharge Instructions for Patients Receiving Chemotherapy  Today you received the following chemotherapy agents:  Oxaliplatin  To help prevent nausea and vomiting after your treatment, we encourage you to take your nausea medication as prescribed.   If you develop nausea and vomiting that is not controlled by your nausea medication, call the clinic.   BELOW ARE SYMPTOMS THAT SHOULD BE REPORTED IMMEDIATELY:  *FEVER GREATER THAN 100.5 F  *CHILLS WITH OR WITHOUT FEVER  NAUSEA AND VOMITING THAT IS NOT CONTROLLED WITH YOUR NAUSEA MEDICATION  *UNUSUAL SHORTNESS OF BREATH  *UNUSUAL BRUISING OR BLEEDING  TENDERNESS IN MOUTH AND THROAT WITH OR WITHOUT PRESENCE OF ULCERS  *URINARY PROBLEMS  *BOWEL PROBLEMS  UNUSUAL RASH Items with * indicate a potential emergency and should be followed up as soon as possible.  Feel free to call the clinic you have any questions or concerns. The clinic phone number is [phone].  Please show the CHEMO ALERT CARD at check-in to the Emergency Department and triage nurse.   

## 2015-07-31 NOTE — Telephone Encounter (Signed)
Per staff phone call and POF I have schedueld appts. Scheduler advised of appts.  JMW  

## 2015-07-31 NOTE — Telephone Encounter (Signed)
cld & spoke to pt in [place] and adv of next appt 2/20 adv to get updated copy of sch

## 2015-07-31 NOTE — Progress Notes (Signed)
[place]  [phone] [phone]  Clinic follow Up Note   Patient Care Team: No Pcp Per Patient as PCP - General (General Practice) 07/31/2015   CHIEF COMPLAINTS:  Follow up stage III right colon cancer  Oncology History   Cancer of right colon [facility])   Staging form: Colon and Rectum, AJCC 7th Edition     Pathologic stage from 06/12/2015: Stage IIIC (T3, N2b, cM0) - Signed by Truitt Merle, MD on 07/07/2015       Cancer of right colon ([place])   06/09/2015 Imaging CT chest abdomen and pelvis showed proximal and a transverse colitis, fluid-filled and dilated transverse colon indicating of obstruction related to a mass at the junction of the transverse and descending colon. trace left pleural effusion.    06/10/2015 Initial Diagnosis Cancer of right colon ([place])   06/10/2015 Procedure Colonoscopy showed a friable ulcerated mucosa in distal to mid transverse with stricture, scope couldn't be advanced beyond the. 11 sessile polyps removed.   06/12/2015 Surgery Right hemicolectomy and lymph node dissection.   06/12/2015 Pathology Results Moderately differentiated colonic adenocarcinoma, 3 cm, G2, located in the transverse colon, LVI(-), perineural invasion (-), T3, margins were negative, 7 out of 30 lymph nodes were positive, polyps were tubular adenoma.    HISTORY OF PRESENTING ILLNESS:  John Moon 45 y.o. male is here because of His recently diagnosed stage III colon cancer. He is accompanied by his wife, half-brother, son, and mother to the clinic today.   He presented to [facility] emergency room with sudden onset abdominal pain and nausea for 2 days on 06/09/2015. CT scan reviewed follow-up suction and I palpable mass at the junction of the transverse and descending colon. He was admitted to the hospital, underwent colonoscopy on 06/10/2015, which showed a friable ulcerated mass in distal to mid transverse colon with structure, scope was not able to  advance beyond that. The biopsy of the colon mass showed adenocarcinoma. He also had at least 11 since I polyps removed it he underwent right hemicolectomy and lymph node dissection by Dr. Marlou Starks on 06/12/2015. He tolerated the surgery very well, was discharged home on 06/17/2015.   He has recovered well from his surgery, denies any pain, nausea, change of his bowel habits. He has good appetite and energy level. No other complaints. 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   CURRENT THERAPY:  adjuvant chemo Xeloda '1650mg'$  bid on day 1-14, and oxaliplatin '130mg'$ /m2 on day 1, every 21 days, starting on 07/31/2015  INTERIM HISTORY: Denarius returns for follow up. His chemotherapy was postponed last week due to elevated liver enzymes. He feels well, denies any symptoms. He is here to start his adjuvant chemotherapy today.  MEDICAL HISTORY:  History reviewed. No pertinent past medical history.  SURGICAL HISTORY: Past Surgical History  Procedure Laterality Date  . Colonoscopy with propofol N/A 06/10/2015    Procedure: COLONOSCOPY WITH PROPOFOL;  Surgeon: Mauri Pole, MD;  Location: [facility] ENDOSCOPY;  Service: Endoscopy;  Laterality: N/A;  . Laparoscopic partial colectomy N/A 06/12/2015    Procedure: LAPAROSCOPIC ASSISTED  PARTIAL COLECTOMY;  Surgeon: Autumn Messing III, MD;  Location: [facility] ORS;  Service: General;  Laterality: N/A;  . Partial colectomy  06/12/2015    Procedure: PARTIAL COLECTOMY;  Surgeon: Autumn Messing III, MD;  Location: [facility] ORS;  Service: General;;    SOCIAL HISTORY: Social History   Social  History  . Marital Status: Married     Spouse Name: N/A  . Number of Children: 2  . Years of Education: N/A   Occupational History  . Not on file.   Social History Main Topics  . Smoking status: Current Every Day Smoker -- 1.00 packs/day    Types: Cigarettes  . Smokeless tobacco: Never Used  . Alcohol Use: 4.2 oz/week    2 Standard  drinks or equivalent, 5 Cans of beer per week  . Drug Use: No  . Sexual Activity: No   Other Topics Concern  . 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 No pertinent family history. no family history of colon cancer or other malignancy.   ALLERGIES:  has No Known Allergies.  MEDICATIONS:  Current Outpatient Prescriptions  Medication Sig Dispense Refill  . capecitabine (XELODA) 150 MG tablet Take 1 tablet (150 mg total) by mouth 2 (two) times daily after a meal. Take on days 1-14 of chemotherapy. 28 tablet 3  . capecitabine (XELODA) 500 MG tablet Take 3 tablets (1,500 mg total) by mouth 2 (two) times daily after a meal. Take on days 1-14 of chemotherapy. 84 tablet 3  . ondansetron (ZOFRAN) 8 MG tablet Take 1 tablet (8 mg total) by mouth 2 (two) times daily as needed for refractory nausea / vomiting. Start on day 3 after chemotherapy. 30 tablet 1  . oxyCODONE-acetaminophen (ROXICET) 5-325 MG tablet Take 1-2 tablets by mouth every 4 (four) hours as needed for severe pain. 30 tablet 0  . prochlorperazine (COMPAZINE) 10 MG tablet Take 1 tablet (10 mg total) by mouth every 6 (six) hours as needed (Nausea or vomiting). 30 tablet 1   No current facility-administered medications for this visit.    REVIEW OF SYSTEMS:   Constitutional: Denies fevers, chills or abnormal night sweats Eyes: Denies blurriness of vision, double vision or watery eyes Ears, nose, mouth, throat, and face: Denies mucositis or sore throat Respiratory: Denies cough, dyspnea or wheezes Cardiovascular: Denies palpitation, chest discomfort or lower extremity swelling Gastrointestinal:  Denies nausea, heartburn or change in bowel habits Skin: Denies abnormal skin rashes Lymphatics: Denies new  lymphadenopathy or easy bruising Neurological:Denies numbness, tingling or new weaknesses Behavioral/Psych: Mood is stable, no new changes  All other systems were reviewed with the patient and are negative.  PHYSICAL EXAMINATION: ECOG PERFORMANCE STATUS: 0 - Asymptomatic  Filed Vitals:   07/31/15 1123  BP: 124/77  Pulse: 87  Temp: 98 F (36.7 C)  Resp: 18   Filed Weights   07/31/15 1123  Weight: 138 lb 1.6 oz (62.642 kg)    GENERAL:alert, no distress and comfortable SKIN: skin color, texture, turgor are normal, no rashes or significant lesions EYES: normal, conjunctiva are pink and non-injected, sclera clear OROPHARYNX:no exudate, no erythema and lips, buccal mucosa, and tongue normal  NECK: supple, thyroid normal size, non-tender, without nodularity LYMPH:  no palpable lymphadenopathy in the cervical, axillary or inguinal LUNGS: clear to auscultation and percussion with normal breathing effort HEART: regular rate & rhythm and no murmurs and no lower extremity edema ABDOMEN:abdomen soft, non-tender and normal bowel sounds, surgical incision sites are well healed. Musculoskeletal:no cyanosis of digits and no clubbing  PSYCH: alert & oriented x 3  with fluent speech NEURO: no focal motor/sensory deficits  LABORATORY DATA:  I have reviewed the data as listed CBC Latest Ref Rng 07/31/2015 07/17/2015 06/16/2015  WBC 4.0 - 10.3 10e3/uL 6.9 9.6 16.5(H)  Hemoglobin 13.0 - 17.1 g/dL 13.8 14.0 13.9  Hematocrit 38.4 - 49.9 % 41.7 41.7 41.8  Platelets 140 - 400 10e3/uL 203 214 273    Recent Labs  06/11/15 0520 06/13/15 0459 06/16/15 0530 07/17/15 1038 07/21/15 1102 07/31/15 1032  NA 141 136 138 141 142 141  K 3.7 4.0 3.8 4.1 3.8 3.7  CL 106 99* 103  --   --   --   CO2 '26 25 25 27 24 '$ 21*  GLUCOSE 105* 119* 122* 88 112 119  BUN '7 8 11 '$ 12.1 12.0 9.0  CREATININE 0.74 0.59* 0.49* 0.8 0.7 0.8  CALCIUM 8.8* 9.0 8.4* 9.6 9.2 9.2  GFRNONAA >60 >60 >60  --   --   --   GFRAA >60 >60  >60  --   --   --   PROT  --   --   --  7.7 7.4 7.3  ALBUMIN  --   --   --  4.1 3.9 3.9  AST  --   --   --  44* 51* 32  ALT  --   --   --  100* 129* 82*  ALKPHOS  --   --   --  82 76 19  BILITOT  --   --   --  <0.30 <0.30 0.36    Pathology report Diagnosis 06/12/2015 Colon, segmental resection for tumor, transverse and right colon MODERATELY DIFFERENTIATED COLONIC ADENOCARCINOMA (3 CM) THE TUMOR INVADE THROUGH THE MUSCULARIS PROPRIA INTO PERICOLONIC TISSUE (T3) ALL MARGINS OF RESECTION ARE NEGATIVE FOR TUMOR METASTATIC COLONIC ADENOCARCINOMA IN SEVEN OF THIRTY LYMPH NODES (7/30, N2B) TUBULAR ADENOMA (X1) TUBULAR ADENOMA INVOLVING APPENDIX Microscopic Comment COLON AND RECTUM (INCLUDING TRANS-ANAL RESECTION): Specimen: Right and transverse colon Procedure: segmental resection Tumor site: Transverse colon Specimen integrity: Intact Macroscopic intactness of mesorectum: Not applicable: _ Macroscopic tumor perforation: Invading through the muscularis propria into pericolonic soft tissue Invasive tumor: Maximum size: 3 cm Histologic type(s): Adenocarcinoma Histologic grade and differentiation: G2 G1: well differentiated/low grade G2: moderately differentiated/low grade G3: poorly differentiated/high grade G4: undifferentiated/high grade Type of polyp in which invasive carcinoma arose: Tubular adenoma Microscopic extension of invasive tumor: Invading through the muscularis propria into pericolonic soft tissue Lymph-Vascular invasion: Negative Peri-neural invasion: Negative 1 of 3 FINAL for Pannone, Marquail V ([id number]) Microscopic Comment(continued) Tumor deposit(s) (discontinuous extramural extension): Negative Resection margins: Proximal margin: negative Distal margin: negative Circumferential (radial) (posterior ascending, posterior descending; lateral and posterior mid-rectum; and entire lower 1/3 rectum): negative Mesenteric margin (sigmoid and transverse):  negative Distance closest margin (if all above margins negative): 3.5 cm Treatment effect (neo-adjuvant therapy): Negative Additional polyp(s): Tubular adenoma Non-neoplastic findings: Unremarkable Lymph nodes: number examined 30; number positive: 7 Pathologic Staging: T3, N2b, M_ Ancillary studies: Microsatelite instability PCR ordered Casimer Lanius MD Pathologist, Electronic Signature (Case signed 06/17/2015)    pathology  RADIOGRAPHIC STUDIES: I have personally reviewed the radiological images as listed and agreed with the findings in the report.    ENDOSCOPIC IMPRESSION: 06/10/2015 Friable ulcerated mucosa in distal to mid transverse with stricture, scope couldn't be advanced beyond it, ultiple biopsies taken from the edge of the mass. 11 sessile polyps removed   ASSESSMENT & PLAN:  45 year old [place] male, without significant past medical history, presented with biopsy action.  Colonoscopy  showed a ulcerative mass in the transverse colon, and multiple polyps.   1. Right colon adenocarcinoma, pT3N2bM0, stage IIIB, MSI-stable  -I reviewed his CT scan and surgical path in great details, including the staging, and molecular features.  -I reviewed the nature history of colon cancer, and risk of cancer recurrence after surgery. Due to his locally advanced disease, especially multiple (7) positive nodes, his risk of recurrence is very high.  -I recommend adjuvant chemotherapy to reduce his risk of recurrence, with standard regimen FOLFOX or CAPOX, for a total of 6 months. The benefit, potential side effects and treatment logistics were discussed with him and his family in details, and he opted CAPOX.  -Lab results reviewed with him, his elevated liver enzymes has improved this week, CBC normal, adequate for treatment, will start cycle 1 today.   2. Transaminitis -No past medical history of liver disease. 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  Plan -first cycle CAPOX today   -I'll see him back in 10 days for a toxicity check up. -He is scheduled to have a CT abdomen and pelvis with contrast after chemotherapy today.   All questions were answered. The patient knows to call the clinic with any problems, questions or concerns.   I spent 25 minutes counseling the patient face to face. The total time spent in the appointment was 30 minutes and more than 50% was on counseling.     Truitt Merle, MD 07/31/2015   Addendum  Patient underwent CT abdomen and pelvis with contrast after his clinical visit today  IMPRESSION: 1. No focal hepatic lesion. Mild hepatic steatosis. 2. Post RIGHT hemicolectomy without evidence of local recurrence. 3. No evidence of metastatic disease in the abdomen or pelvis. 4. Small mesenteric lymph nodes are not pathologic by size criteria.   Truitt Merle

## 2015-08-11 ENCOUNTER — Other Ambulatory Visit (HOSPITAL_BASED_OUTPATIENT_CLINIC_OR_DEPARTMENT_OTHER): Payer: 59

## 2015-08-11 ENCOUNTER — Telehealth: Payer: Self-pay | Admitting: Hematology

## 2015-08-11 ENCOUNTER — Telehealth: Payer: Self-pay | Admitting: *Deleted

## 2015-08-11 ENCOUNTER — Ambulatory Visit (HOSPITAL_BASED_OUTPATIENT_CLINIC_OR_DEPARTMENT_OTHER): Payer: 59 | Admitting: Hematology

## 2015-08-11 ENCOUNTER — Encounter: Payer: Self-pay | Admitting: Hematology

## 2015-08-11 ENCOUNTER — Ambulatory Visit: Payer: 59

## 2015-08-11 VITALS — BP 120/81 | HR 71 | Temp 98.4°F | Resp 18 | Ht 68.0 in | Wt 135.7 lb

## 2015-08-11 DIAGNOSIS — C182 Malignant neoplasm of ascending colon: Secondary | ICD-10-CM

## 2015-08-11 DIAGNOSIS — C779 Secondary and unspecified malignant neoplasm of lymph node, unspecified: Secondary | ICD-10-CM

## 2015-08-11 DIAGNOSIS — R740 Nonspecific elevation of levels of transaminase and lactic acid dehydrogenase [LDH]: Secondary | ICD-10-CM

## 2015-08-11 DIAGNOSIS — C184 Malignant neoplasm of transverse colon: Secondary | ICD-10-CM

## 2015-08-11 DIAGNOSIS — K760 Fatty (change of) liver, not elsewhere classified: Secondary | ICD-10-CM | POA: Diagnosis not present

## 2015-08-11 LAB — CBC WITH DIFFERENTIAL/PLATELET
BASO%: 0.4 % (ref 0.0–2.0)
Basophils Absolute: 0 10*3/uL (ref 0.0–0.1)
EOS%: 3 % (ref 0.0–7.0)
Eosinophils Absolute: 0.2 10*3/uL (ref 0.0–0.5)
HCT: 40.8 % (ref 38.4–49.9)
HGB: 13.7 g/dL (ref 13.0–17.1)
LYMPH%: 33.8 % (ref 14.0–49.0)
MCH: 29.6 pg (ref 27.2–33.4)
MCHC: 33.5 g/dL (ref 32.0–36.0)
MCV: 88.4 fL (ref 79.3–98.0)
MONO#: 0.7 10*3/uL (ref 0.1–0.9)
MONO%: 8.9 % (ref 0.0–14.0)
NEUT%: 53.9 % (ref 39.0–75.0)
NEUTROS ABS: 4.1 10*3/uL (ref 1.5–6.5)
PLATELETS: 219 10*3/uL (ref 140–400)
RBC: 4.62 10*6/uL (ref 4.20–5.82)
RDW: 13.3 % (ref 11.0–14.6)
WBC: 7.7 10*3/uL (ref 4.0–10.3)
lymph#: 2.6 10*3/uL (ref 0.9–3.3)

## 2015-08-11 LAB — COMPREHENSIVE METABOLIC PANEL
ALT: 61 U/L — AB (ref 0–55)
AST: 25 U/L (ref 5–34)
Albumin: 3.8 g/dL (ref 3.5–5.0)
Alkaline Phosphatase: 77 U/L (ref 40–150)
Anion Gap: 8 mEq/L (ref 3–11)
BILIRUBIN TOTAL: 0.41 mg/dL (ref 0.20–1.20)
BUN: 8.9 mg/dL (ref 7.0–26.0)
CHLORIDE: 110 meq/L — AB (ref 98–109)
CO2: 24 meq/L (ref 22–29)
CREATININE: 0.8 mg/dL (ref 0.7–1.3)
Calcium: 9 mg/dL (ref 8.4–10.4)
EGFR: >90 mL/min/{1.73_m2} (ref >90)
Glucose: 121 mg/dl (ref 70–140)
Potassium: 3.8 mEq/L (ref 3.5–5.1)
SODIUM: 141 meq/L (ref 136–145)
TOTAL PROTEIN: 7 g/dL (ref 6.4–8.3)

## 2015-08-11 NOTE — Progress Notes (Signed)
[place]  Telephone:([phone]) [phone] Fax:(336) [phone]  Clinic follow Up Note   Patient Care Team: No Pcp Per Patient as PCP - General (General Practice) 08/11/2015   CHIEF COMPLAINTS:  Follow up stage III right colon cancer  Oncology History   Cancer of right colon [facility])   Staging form: Colon and Rectum, AJCC 7th Edition     Pathologic stage from 06/12/2015: Stage IIIC (T3, N2b, cM0) - Signed by Truitt Merle, MD on 07/07/2015       Cancer of right colon ([place])   06/09/2015 Imaging CT chest abdomen and pelvis showed proximal and a transverse colitis, fluid-filled and dilated transverse colon indicating of obstruction related to a mass at the junction of the transverse and descending colon. trace left pleural effusion.    06/10/2015 Initial Diagnosis Cancer of right colon ([place])   06/10/2015 Procedure Colonoscopy showed a friable ulcerated mucosa in distal to mid transverse with stricture, scope couldn't be advanced beyond the. 11 sessile polyps removed.   06/12/2015 Surgery Right hemicolectomy and lymph node dissection.   06/12/2015 Pathology Results Moderately differentiated colonic adenocarcinoma, 3 cm, G2, located in the transverse colon, LVI(-), perineural invasion (-), T3, margins were negative, 7 out of 30 lymph nodes were positive, polyps were tubular adenoma.   07/31/2015 -  Chemotherapy adjuvant chemo CAPEOX, oxaliplatin 1 30 mg/m, Xeloda 1000 mg/m on day 1-14, every 21 days    HISTORY OF PRESENTING ILLNESS:  John Moon 45 y.o. male is here because of His recently diagnosed stage III colon cancer. He is accompanied by his wife, half-brother, son, and mother to the clinic today.   He presented to [facility] emergency room with sudden onset abdominal pain and nausea for 2 days on 06/09/2015. CT scan reviewed follow-up suction and I palpable mass at the junction of the transverse and descending colon. He was admitted to the hospital, underwent colonoscopy on  06/10/2015, which showed a friable ulcerated mass in distal to mid transverse colon with structure, scope was not able to advance beyond that. The biopsy of the colon mass showed adenocarcinoma. He also had at least 11 since I polyps removed it he underwent right hemicolectomy and lymph node dissection by Dr. Marlou Starks on 06/12/2015. He tolerated the surgery very well, was discharged home on 06/17/2015.   He has recovered well from his surgery, denies any pain, nausea, change of his bowel habits. He has good appetite and energy level. No other complaints. 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   CURRENT THERAPY:  adjuvant chemo Xeloda 1641m bid on day 1-14, and oxaliplatin 1336mm2 on day 1, every 21 days, started on 07/31/2015  INTERIM HISTORY: [place] for follow up with his wife.His interpreter is with him today. He had his first oxaliplatin on 07/31/15.  He is also on xeloda and doing well.  He denies pain, no nausea or diarrhea.  He has good appetite and eating well. His weight is stable. He is back to work and a tolerating his work well.  MEDICAL HISTORY:  Past Medical History  Diagnosis Date  . [facility]    SURGICAL HISTORY: Past Surgical History  Procedure Laterality Date  . Colonoscopy with propofol N/A 06/10/2015    Procedure: COLONOSCOPY WITH PROPOFOL;  Surgeon: KaMauri PoleMD;  Location: [facility] ENDOSCOPY;  Service: Endoscopy;  Laterality: N/A;  . Laparoscopic partial colectomy N/A 06/12/2015  Procedure: LAPAROSCOPIC ASSISTED  PARTIAL COLECTOMY;  Surgeon: Autumn Messing III, MD;  Location: [facility] ORS;  Service: General;  Laterality: N/A;  . Partial colectomy  06/12/2015    Procedure: PARTIAL COLECTOMY;  Surgeon: Autumn Messing III, MD;  Location: [facility] ORS;  Service: General;;    SOCIAL HISTORY: Social History   Social History  . Marital Status: Married     Spouse Name: N/A  . Number of Children: 2  . Years of  Education: N/A   Occupational History  . Not on file.   Social History Main Topics  . Smoking status: Current Every Day Smoker -- 1.00 packs/day    Types: Cigarettes  . Smokeless tobacco: Never Used  . Alcohol Use: 4.2 oz/week    2 Standard drinks or equivalent, 5 Cans of beer per week  . Drug Use: No  . Sexual Activity: No   Other Topics Concern  . 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 No pertinent family history. no family history of colon cancer or other malignancy.   ALLERGIES:  has No Known Allergies.  MEDICATIONS:  Current Outpatient Prescriptions  Medication Sig Dispense Refill  . capecitabine (XELODA) 150 MG tablet Take 1 tablet (150 mg total) by mouth 2 (two) times daily after a meal. Take on days 1-14 of chemotherapy. 28 tablet 3  . capecitabine (XELODA) 500 MG tablet Take 3 tablets (1,500 mg total) by mouth 2 (two) times daily after a meal. Take on days 1-14 of chemotherapy. 84 tablet 3  . ondansetron (ZOFRAN) 8 MG tablet Take 1 tablet (8 mg total) by mouth 2 (two) times daily as needed for refractory nausea / vomiting. Start on day 3 after chemotherapy. (Patient not taking: Reported on 08/11/2015) 30 tablet 1  . oxyCODONE-acetaminophen (ROXICET) 5-325 MG tablet Take 1-2 tablets by mouth every 4 (four) hours as needed for severe pain. (Patient not taking: Reported on 08/11/2015) 30 tablet 0  . prochlorperazine (COMPAZINE) 10 MG tablet Take 1 tablet (10 mg total) by mouth every 6 (six) hours as needed (Nausea or vomiting). (Patient not taking: Reported on 08/11/2015) 30 tablet 1   No current facility-administered medications for this visit.    REVIEW OF SYSTEMS:   Constitutional: Denies fevers, chills or abnormal night sweats Eyes: Denies  blurriness of vision, double vision or watery eyes Ears, nose, mouth, throat, and face: Denies mucositis or sore throat Respiratory: Denies cough, dyspnea or wheezes Cardiovascular: Denies palpitation, chest discomfort or lower extremity swelling Gastrointestinal:  Denies nausea, heartburn or change in bowel habits Skin: Denies abnormal skin rashes Lymphatics: Denies new lymphadenopathy or easy bruising Neurological:Denies numbness, tingling or new weaknesses Behavioral/Psych: Mood is stable, no new changes  All other systems were reviewed with the patient and are negative.  PHYSICAL EXAMINATION: ECOG PERFORMANCE STATUS: 0 - Asymptomatic  Filed Vitals:   08/11/15 0940  BP: 120/81  Pulse: 71  Temp: 98.4 F (36.9 C)  Resp: 18   Filed Weights   08/11/15 0940  Weight: 135 lb 11.2 oz (61.553 kg)    GENERAL:alert, no distress and comfortable SKIN: skin color, texture, turgor are normal, no rashes or significant lesions EYES: normal, conjunctiva are pink and non-injected, sclera clear OROPHARYNX:no exudate, no erythema and lips,  buccal mucosa, and tongue normal  NECK: supple, thyroid normal size, non-tender, without nodularity LYMPH:  no palpable lymphadenopathy in the cervical, axillary or inguinal LUNGS: clear to auscultation and percussion with normal breathing effort HEART: regular rate & rhythm and no murmurs and no lower extremity edema ABDOMEN:abdomen soft, non-tender and normal bowel sounds, surgical incision sites are well healed. Musculoskeletal:no cyanosis of digits and no clubbing  PSYCH: alert & oriented x 3 with fluent speech NEURO: no focal motor/sensory deficits  LABORATORY DATA:  I have reviewed the data as listed CBC Latest Ref Rng 08/11/2015 07/31/2015 07/17/2015  WBC 4.0 - 10.3 10e3/uL 7.7 6.9 9.6  Hemoglobin 13.0 - 17.1 g/dL 13.7 13.8 14.0  Hematocrit 38.4 - 49.9 % 40.8 41.7 41.7  Platelets 140 - 400 10e3/uL 219 203 214    Recent Labs  06/11/15 0520  06/13/15 0459 06/16/15 0530  07/21/15 1102 07/31/15 1032 08/11/15 0925  NA 141 136 138  < > 142 141 141  K 3.7 4.0 3.8  < > 3.8 3.7 3.8  CL 106 99* 103  --   --   --   --   CO2 _0 < > 24 21* 24  GLUCOSE 105* 119* 122*  < > 112 119 121  BUN _1 < > 12.0 9.0 8.9  CREATININE 0.74 0.59* 0.49*  < > 0.7 0.8 0.8  CALCIUM 8.8* 9.0 8.4*  < > 9.2 9.2 9.0  GFRNONAA >60 >60 >60  --   --   --   --   GFRAA >60 >60 >60  --   --   --   --   PROT  --   --   --   < > 7.4 7.3 7.0  ALBUMIN  --   --   --   < > 3.9 3.9 3.8  AST  --   --   --   < > 51* 32 25  ALT  --   --   --   < > 129* 82* 61*  ALKPHOS  --   --   --   < > 76 77 77  BILITOT  --   --   --   < > <0.30 0.36 0.41  < > = values in this interval not displayed.  Pathology report Diagnosis 06/12/2015 Colon, segmental resection for tumor, transverse and right colon MODERATELY DIFFERENTIATED COLONIC ADENOCARCINOMA (3 CM) THE TUMOR INVADE THROUGH THE MUSCULARIS PROPRIA INTO PERICOLONIC TISSUE (T3) ALL MARGINS OF RESECTION ARE NEGATIVE FOR TUMOR METASTATIC COLONIC ADENOCARCINOMA IN SEVEN OF THIRTY LYMPH NODES (7/30, N2B) TUBULAR ADENOMA (X1) TUBULAR ADENOMA INVOLVING APPENDIX Microscopic Comment COLON AND RECTUM (INCLUDING TRANS-ANAL RESECTION): Specimen: Right and transverse colon Procedure: segmental resection Tumor site: Transverse colon Specimen integrity: Intact Macroscopic intactness of mesorectum: Not applicable: _ Macroscopic tumor perforation: Invading through the muscularis propria into pericolonic soft tissue Invasive tumor: Maximum size: 3 cm Histologic type(s): Adenocarcinoma Histologic grade and differentiation: G2 G1: well differentiated/low grade G2: moderately differentiated/low grade G3: poorly differentiated/high grade G4: undifferentiated/high grade Type of polyp in which invasive carcinoma arose: Tubular adenoma Microscopic extension of invasive tumor: Invading through the muscularis propria into  pericolonic soft tissue Lymph-Vascular invasion: Negative Peri-neural invasion: Negative 1 of 3 FINAL for Prochnow, Croy V ([id number]) Microscopic Comment(continued) Tumor deposit(s) (discontinuous extramural extension): Negative Resection margins: Proximal margin: negative Distal margin: negative Circumferential (radial) (posterior ascending, posterior descending; lateral and posterior mid-rectum; and entire lower 1/3 rectum): negative Mesenteric margin (sigmoid  and transverse): negative Distance closest margin (if all above margins negative): 3.5 cm Treatment effect (neo-adjuvant therapy): Negative Additional polyp(s): Tubular adenoma Non-neoplastic findings: Unremarkable Lymph nodes: number examined 30; number positive: 7 Pathologic Staging: T3, N2b, M_ Ancillary studies: Microsatelite instability PCR ordered Casimer Lanius MD Pathologist, Electronic Signature (Case signed 06/17/2015)    pathology  RADIOGRAPHIC STUDIES: I have personally reviewed the radiological images as listed and agreed with the findings in the report.    ENDOSCOPIC IMPRESSION: 06/10/2015 Friable ulcerated mucosa in distal to mid transverse with stricture, scope couldn't be advanced beyond it, ultiple biopsies taken from the edge of the mass. 11 sessile polyps removed   ASSESSMENT & PLAN:  45 year old [place] male, without significant past medical history, presented with biopsy action.  Colonoscopy showed a ulcerative mass in the transverse colon, and multiple polyps.   1. Right colon adenocarcinoma, pT3N2bM0, stage IIIB, MSI-stable  -I reviewed his CT scan and surgical path in great details, including the staging, and molecular features.  -I reviewed the nature history of colon cancer, and risk of cancer recurrence after surgery. Due to his locally advanced disease, especially multiple (7) positive nodes, his risk of recurrence is very high.  -I recommend adjuvant chemotherapy to reduce his risk of  recurrence, with standard regimen FOLFOX or CAPOX, for a total of 6 months. The benefit, potential side effects and treatment logistics were discussed with him and his family in details, and he opted CAPOX.  -he has started adjuvant chemotherapy, currently cycle 1, tolerating well, lab CBC and CMP normal, we'll continue.   2. Transaminitis -No past medical history of liver disease. 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  Plan -I reviewed his lab results and the CT scan results in details, neutropenic percussion reviewed with him again today -continue cycle 1 Xeloda, he will complete in 2 days. He will call his pharmacy to get a refill next week.  -he will return on March 2 to see APP and cycle 2 CAPOX  -I'll see him back before cycle 3 on 3/23   All questions were answered. The patient knows to call the clinic with any problems, questions or concerns.   I spent 20 minutes counseling the patient face to face. The total time spent in the appointment was 25 minutes and more than 50% was on counseling.     Truitt Merle, MD 08/11/2015

## 2015-08-11 NOTE — Telephone Encounter (Signed)
Per staff message and POF I have scheduled appts. Advised scheduler of appts. JMW  

## 2015-08-11 NOTE — Telephone Encounter (Signed)
Pt confirmed labs/ov per 02/20 POF, gave pt AVS and Calendar... KJ, sent msg to add chemo °

## 2015-08-12 LAB — CEA: CEA: 5.6 ng/mL — ABNORMAL HIGH (ref 0.0–4.7)

## 2015-08-13 ENCOUNTER — Other Ambulatory Visit: Payer: 59

## 2015-08-13 ENCOUNTER — Encounter: Payer: 59 | Admitting: Genetic Counselor

## 2015-08-17 ENCOUNTER — Other Ambulatory Visit: Payer: Self-pay | Admitting: Hematology

## 2015-08-20 ENCOUNTER — Telehealth: Payer: Self-pay | Admitting: Hematology

## 2015-08-20 NOTE — Telephone Encounter (Signed)
Due to YF out moved 3/2 f/u to CB. Start time remains the same lab/CB/tx 10:30 am.

## 2015-08-21 ENCOUNTER — Ambulatory Visit (HOSPITAL_BASED_OUTPATIENT_CLINIC_OR_DEPARTMENT_OTHER): Payer: 59 | Admitting: Nurse Practitioner

## 2015-08-21 ENCOUNTER — Other Ambulatory Visit (HOSPITAL_BASED_OUTPATIENT_CLINIC_OR_DEPARTMENT_OTHER): Payer: 59

## 2015-08-21 ENCOUNTER — Ambulatory Visit (HOSPITAL_BASED_OUTPATIENT_CLINIC_OR_DEPARTMENT_OTHER): Payer: 59

## 2015-08-21 VITALS — BP 125/82 | HR 70 | Temp 98.3°F | Resp 18 | Ht 68.0 in | Wt 138.2 lb

## 2015-08-21 DIAGNOSIS — C779 Secondary and unspecified malignant neoplasm of lymph node, unspecified: Secondary | ICD-10-CM | POA: Diagnosis not present

## 2015-08-21 DIAGNOSIS — C189 Malignant neoplasm of colon, unspecified: Secondary | ICD-10-CM | POA: Diagnosis not present

## 2015-08-21 DIAGNOSIS — K6389 Other specified diseases of intestine: Secondary | ICD-10-CM

## 2015-08-21 DIAGNOSIS — C184 Malignant neoplasm of transverse colon: Secondary | ICD-10-CM

## 2015-08-21 DIAGNOSIS — Z5111 Encounter for antineoplastic chemotherapy: Secondary | ICD-10-CM

## 2015-08-21 DIAGNOSIS — C182 Malignant neoplasm of ascending colon: Secondary | ICD-10-CM

## 2015-08-21 LAB — CBC WITH DIFFERENTIAL/PLATELET
BASO%: 0.4 % (ref 0.0–2.0)
BASOS ABS: 0 10*3/uL (ref 0.0–0.1)
EOS ABS: 0.2 10*3/uL (ref 0.0–0.5)
EOS%: 1.7 % (ref 0.0–7.0)
HCT: 39.5 % (ref 38.4–49.9)
HGB: 13.1 g/dL (ref 13.0–17.1)
LYMPH%: 33.3 % (ref 14.0–49.0)
MCH: 30.2 pg (ref 27.2–33.4)
MCHC: 33.3 g/dL (ref 32.0–36.0)
MCV: 90.8 fL (ref 79.3–98.0)
MONO#: 0.7 10*3/uL (ref 0.1–0.9)
MONO%: 7.3 % (ref 0.0–14.0)
NEUT#: 5.2 10*3/uL (ref 1.5–6.5)
NEUT%: 57.3 % (ref 39.0–75.0)
PLATELETS: 207 10*3/uL (ref 140–400)
RBC: 4.35 10*6/uL (ref 4.20–5.82)
RDW: 14.9 % — ABNORMAL HIGH (ref 11.0–14.6)
WBC: 9.1 10*3/uL (ref 4.0–10.3)
lymph#: 3 10*3/uL (ref 0.9–3.3)

## 2015-08-21 LAB — COMPREHENSIVE METABOLIC PANEL
ALT: 44 U/L (ref 0–55)
AST: 19 U/L (ref 5–34)
Albumin: 3.9 g/dL (ref 3.5–5.0)
Alkaline Phosphatase: 80 U/L (ref 40–150)
Anion Gap: 11 mEq/L (ref 3–11)
BUN: 10.3 mg/dL (ref 7.0–26.0)
CHLORIDE: 108 meq/L (ref 98–109)
CO2: 22 meq/L (ref 22–29)
Calcium: 8.9 mg/dL (ref 8.4–10.4)
Creatinine: 0.8 mg/dL (ref 0.7–1.3)
GLUCOSE: 133 mg/dL (ref 70–140)
POTASSIUM: 3.7 meq/L (ref 3.5–5.1)
SODIUM: 140 meq/L (ref 136–145)
Total Bilirubin: 0.3 mg/dL (ref 0.20–1.20)
Total Protein: 7.1 g/dL (ref 6.4–8.3)

## 2015-08-21 MED ORDER — OXALIPLATIN CHEMO INJECTION 100 MG/20ML
130.0000 mg/m2 | Freq: Once | INTRAVENOUS | Status: AC
  Administered 2015-08-21: 220 mg via INTRAVENOUS
  Filled 2015-08-21: qty 34

## 2015-08-21 MED ORDER — DEXTROSE 5 % IV SOLN
Freq: Once | INTRAVENOUS | Status: AC
  Administered 2015-08-21: 13:00:00 via INTRAVENOUS

## 2015-08-21 MED ORDER — PALONOSETRON HCL INJECTION 0.25 MG/5ML
INTRAVENOUS | Status: AC
  Filled 2015-08-21: qty 5

## 2015-08-21 MED ORDER — SODIUM CHLORIDE 0.9 % IV SOLN
10.0000 mg | Freq: Once | INTRAVENOUS | Status: AC
  Administered 2015-08-21: 10 mg via INTRAVENOUS
  Filled 2015-08-21: qty 1

## 2015-08-21 MED ORDER — PALONOSETRON HCL INJECTION 0.25 MG/5ML
0.2500 mg | Freq: Once | INTRAVENOUS | Status: AC
  Administered 2015-08-21: 0.25 mg via INTRAVENOUS

## 2015-08-21 NOTE — Patient Instructions (Signed)
[facility] Discharge Instructions for Patients Receiving Chemotherapy  Today you received the following chemotherapy agents Oxaliplatin.  To help prevent nausea and vomiting after your treatment, we encourage you to take your nausea medication as prescribed.   If you develop nausea and vomiting that is not controlled by your nausea medication, call the clinic.   BELOW ARE SYMPTOMS THAT SHOULD BE REPORTED IMMEDIATELY:  *FEVER GREATER THAN 100.5 F  *CHILLS WITH OR WITHOUT FEVER  NAUSEA AND VOMITING THAT IS NOT CONTROLLED WITH YOUR NAUSEA MEDICATION  *UNUSUAL SHORTNESS OF BREATH  *UNUSUAL BRUISING OR BLEEDING  TENDERNESS IN MOUTH AND THROAT WITH OR WITHOUT PRESENCE OF ULCERS  *URINARY PROBLEMS  *BOWEL PROBLEMS  UNUSUAL RASH Items with * indicate a potential emergency and should be followed up as soon as possible.  Feel free to call the clinic you have any questions or concerns. The clinic phone number is [phone].  Please show the [facility] at check-in to the Emergency Department and triage nurse.  Oxaliplatin Injection ?y l thu?c g? OXALIPLATIN l thu?c ha tr? li?u. N nh?m ??n cc t? bo phn chia nhanh, nh? cc t? bo ung th?, v lm cho cc t? bo ? ch?t. Thu?c ny ???c dng ?? ?i?u tr? ung th? ru?t gi v ru?t th?ng (tr?c trng), v nhi?u lo?i ung th? khc. Thu?c ny c th? ???c dng cho nh?ng m?c ?ch khc; hy h?i ng??i cung c?p d?ch v? y t? ho?c d??c s? c?a mnh, n?u qu v? c th?c m?c. Ti c?n ph?i bo cho ng??i cung c?p d?ch v? y t? c?a mnh ?i?u g tr??c khi dng thu?c ny? H? c?n bi?t li?u qu v? hi?n c b?t k? tnh tr?ng no sau ?y hay khng: -b?nh th?n -pha?n ??ng b?t th???ng ho??c di? ??ng v??i oxaliplatin ho?c thu?c ha tr? li?u khc -pha?n ??ng b?t th???ng ho??c di? ??ng v??i ca?c d??c ph?m kha?c, th?c ph?m, thu?c nhu?m, ho??c ch?t ba?o qua?n -?ang c thai ho??c ??nh co? thai -?ang cho con bu? Ti nn s? d?ng thu?c  ny nh? th? no? Thu?c ny ?? truy?n vo t?nh m?ch. Thu?c ny ???c cho trong b?nh vi?n ho?c phng m?ch b?i chuyn vin y t? ???c hu?n luy?n ??c bi?t. Hy bn v?i bc s? nhi khoa c?a qu v? v? vi?c dng thu?c ny ? tr? em. C th? c?n ch?m [place] ??c bi?t. Qu li?u: N?u qu v? cho r?ng mnh ? dng qu nhi?u thu?c ny, th hy lin l?c v?i trung tm ki?m sot ch?t ??c ho?c phng c?p c?u ngay l?p t?c. L?U : Thu?c ny ch? dnh ring cho qu v?. Khng chia s? thu?c ny v?i nh?ng ng??i khc. N?u ti l? qun m?t li?u th sao? ?i?u quan tr?ng l khng nn b? l? li?u thu?c no. Hy lin l?c v?i bc s? ho?c [place] vin y t? c?a mnh, n?u qu v? khng th? gi? ?ng cu?c h?n khm. Nh?ng g c th? t??ng tc v?i thu?c ny? -m?t s? thu?c lm t?ng s? l??ng t? bo mu, ch?ng h?n nh? filgrastim, pegfilgrastim, sargramostim -probenecid -m?t s? thu?c khng sinh, ch?ng h?n nh? amikacin, gentamicin, neomycin, polymyxin B, streptomycin, tobramycin -zalcitabine Hy bo cho bc s? ho?c chuyn vin y t? c?a mnh tr??c khi dng b?t k? thu?c no trong s? nh?ng thu?c ny: -acetaminophen -aspirin -ibuprofen -ketoprofen -naproxen Danh sch ny c th? khng m t? ?? h?t cc t??ng tc c th? x?y ra. Hy ??a cho ng??i cung c?p d?ch  v? y t? c?a mnh danh sch t?t c? cc thu?c, th?o d??c, cc thu?c khng c?n toa, ho?c cc ch? ph?m b? sung m qu v? dng. C?ng nn bo cho h? bi?t r?ng qu v? c ht thu?c, u?ng r??u, ho?c c s? d?ng ma ty tri php hay khng. Vi th? c th? t??ng tc v?i thu?c c?a qu v?. Ti c?n ph?i theo di ?i?u g trong khi dng thu?c ny? Qu v? s? ???c theo di ch?t ch? trong khi dng thu?c ny. Qu v? s? c?n ph?i ?i lm cc xt nghi?m mu quan tr?ng trong th?i gian dng thu?c ny. Thu?c ny c th? khi?n cho qu v? nh?y c?m h?n v?i l?nh. Khng ???c u?ng ?? u?ng l?nh ho?c n??c ?. Hy che kn cc vng da h? tr??c khi ti?p xc v?i nhi?t ?? l?nh ho?c cc v?t l?nh. Khi ?i ra ngoi tr?i l?nh, hy m?c qu?n o ?m v che  mi?ng v m?i ?? lm ?m khng kh ht vo ph?i. Hy bo cho bc s? n?u qu v? b? nh?y c?m v?i l?nh. Thu?c ny c th? lm cho qu v? c?m th?y khng ???c kh?e nh? th??ng l?. ?i?u ny khng ph?i khng ph? bi?n, b?i v thu?c ha tr? li?u c th? ?nh h??ng ??n c? t? bo lnh l?n t? bo ung th?. Hy t??ng trnh m?i tc d?ng ph?. Hy ti?p t?c ??t ?i?u tr? c?a mnh ngay c? khi qu v? c?m th?y m?t, tr? khi bc s? yu c?u qu v? ng?ng ?i?u tr?[place] m?t s? tr??ng h?p, qu v? c th? ???c cho dng cc thu?c ph? thm ?? gip gi?m tc d?ng ph?. Hy lm theo t?t c? cc h??ng d?n v? vi?c s? d?ng chng. Hy h?i  ki?n bc s? ho?c chuyn vin y t?, n?u qu v? b? s?t, ?n l?nh ho?c ?au h?ng, ho?c c cc tri?u ch?ng khc c?a c?m l?nh ho?c cm. Khng ???c t? ?i?u tr? cho mnh. Thu?c ny lm gi?m kh? n?ng ch?ng l?i cc b?nh nhi?m trng c?a c? th?. Hy c? trnh ? g?n nh?ng ng??i b? b?nh. Thu?c ny c th? lm t?ng nguy c? b? b?m tm ho?c ch?y mu. Hy lin l?c v?i bc s? ho?c chuyn vin y t?, n?u qu v? th?y ch?y mu b?t th??ng. Hy c?n th?n khi ?nh r?ng ho?c x?a r?ng b?ng ch? nha khoa ho?c b?ng t?m, b?i v qu v? c th? d? b? nhi?m trng ho?c d? b? ch?y mu h?n. N?u qu v? c ?i lm r?ng, th hy bo v?i nha s? r?ng qu v? ?ang dng thu?c ny. Trnh dng cc thu?c c ch?a aspirin, acetaminophen, ibuprofen, naproxen, ho?c ketoprofen, tr? khi ? ???c bc s? ch? d?n. Cc thu?c ny c th? che l?p tri?u ch?ng s?t. Khng ???c ?? c thai trong khi dng thu?c ny. Ph? n? c?n ph?i thng bo cho bc s? c?a mnh, n?u mu?n c thai ho?c ngh? r?ng c th? mnh ? c [place]. C nguy c? v? cc tc d?ng ph? nghim tr?ng ??i v?i [place] nhi. Hy th?o lu?n v?i bc s? ho?c chuyn vin y t? ho?c d??c s? ?? bi?t thm thng tin. Khng ???c nui con b?ng s?a m? trong khi dng thu?c ny. Hy lin l?c v?i bc s? ho?c chuyn vin y t? n?u qu v? b? tiu ch?y. Khng ???c t? ?i?u tr? cho mnh. Ti c th? nh?n th?y nh?ng tc d?ng ph? no khi dng thu?c ny? Nh?ng  tc d?ng ph? qu v?  c?n ph?i bo cho bc s? ho?c chuyn vin y t? cng s?m cng t?t: -cc ph?n ?ng d? ?ng, ch?ng h?n nh? da b? m?n ??, ng?a, n?i my ?ay, s?ng ? m?t, mi, ho?c l??i -gi?m s? l??ng t? bo mu - thu?c ny c th? lm gi?m s? l??ng t? bo b?ch c?u, t? bo h?ng c?u v ti?u c?u. Qu v? c th? c nguy c? cao b? nhi?m trng v xu?t huy?t. -cc d?u hi?u nhi?m trng - s?t ho?c ?n l?nh, ho, ?au h?ng, kh ?i ti?u ho?c ?i ti?u ?au -cc d?u hi?u gi?m ti?u c?u ho?c xu?t huy?t - b?m tm, cc n?t l?m t?m ?? trn da, phn c mu ?en, mu h?c n, ch?y mu cam -cc d?u hi?u gi?m s? l??ng t? bo h?ng c?u - c?m th?y y?u ?t ho?c m?t m?i m?t cch b?t th??ng, cc c?n ng?t x?u, chong vng -cc v?n ?? v? h h?p -?au ng?c, t?c ng?c -ho -tiu ch?y -c?ng quai hm -lot mi?ng -bu?n i ho?c i m?a -?au, s?ng, ??, ho?c kch ?ng ? ch? tim -?au, t ho?c c?m gic nh? b? ki?n b ? bn tay ho?c bn chn -cc v?n ?? v? gi? th?ng b?ng, ni n?ng, ?i l?i -m?n ??, r?p da, bong ho?c trc da, bao g?m bn trong mi?ng. -kh ?i ti?u ho?c thay ??i l??ng n??c ti?u ???c bi ti?t Cc tc d?ng ph? khng c?n ph?i ch?m [place] y t? (hy bo cho bc s? ho?c chuyn vin y t?, n?u cc tc d?ng ph? ny ti?p di?n ho?c gy phi?n toi): -thay ??i th? l?c -to bn -r?ng tc -m?t c?m gic ngon mi?ng -thay ??i v? gic, ho?c c v? kim lo?i trong mi?ng -?au b?ng Danh sch ny c th? khng m t? ?? h?t cc tc d?ng ph? c th? x?y ra. Xin g?i t?i bc s? c?a mnh ?? ???c c? v?n chuyn mn v? cc tc d?ng ph?Sander Nephew v? c th? t??ng trnh cc tc d?ng ph? cho FDA theo s? [phone]. Ti nn c?t gi? thu?c c?a mnh ? ?u? Thu?c ny ???c s? d?ng b?i chuyn vin y t? ? b?nh vi?n ho?c ? phng m?ch. Qu v? s? khng ???c c?p thu?c ny ?? c?t gi? t?i nh. L?U : ?y l b?n tm t?t. N c th? khng bao hm t?t c? thng tin c th? c. N?u qu v? th?c m?c v? thu?c ny, xin trao ??i v?i bc s?, d??c s?, ho?c ng??i cung c?p d?ch v? y t? c?a mnh.     2016, [employer]vier/[employer] Standard. (2009-05-09 00:00:00)

## 2015-08-21 NOTE — Patient Instructions (Signed)
The forms for disability and FMLA need to come from your employer's disability entity. These forms can be dropped off at the front desk and we will have our disability liaison fill them out.  If you have questions you may call Thermon Leyland at [phone]  Once finished with treatment today please stop by the Financial Advocate office in the main hallway to speak to someone about financial assistance. Please have your interpreter with you.

## 2015-08-23 ENCOUNTER — Encounter: Payer: Self-pay | Admitting: Nurse Practitioner

## 2015-08-23 NOTE — Progress Notes (Signed)
SYMPTOM MANAGEMENT CLINIC   HPI: John Moon 45 y.o. male diagnosed with colon cancer.  Currently undergoing oxaliplatin chemotherapy and Xeloda oral therapy.   Patient presented to the [facility] today to receive cycle 2 of his oxaliplatin chemotherapy regimen.  Patient reports that he also has initiated his second cycle of Xeloda oral therapy as well.  He has been instructed to take the Xeloda twice daily on days #1 through 14 of an every three-week cycle.  Patient states that he has been feeling very well recently; and has no new complaints.  He denies any recent fevers or chills.  Blood counts obtained today were within normal limits.  Vital signs were stable and patient was afebrile.  Translator was present for entire visit.  Patient is asking about new disability and FMLA forms.  Patient was advised to bring both forms from his job place; and Dr. Burr Medico will be able to assist with filling these papers out for him.   Patient is scheduled to return home, March 6 for labs and will return on 09/11/2015 for labs, physical, and chemotherapy.  HPI  ROS  Past Medical History  Diagnosis Date  . Colon cancer [facility])     Past Surgical History  Procedure Laterality Date  . Colonoscopy with propofol N/A 06/10/2015    Procedure: COLONOSCOPY WITH PROPOFOL;  Surgeon: Mauri Pole, MD;  [facility];  Service: Endoscopy;  Laterality: N/A;  . Laparoscopic partial colectomy N/A 06/12/2015    Procedure: LAPAROSCOPIC ASSISTED  PARTIAL COLECTOMY;  Surgeon: Autumn Messing III, MD;  Location: [facility] ORS;  Service: General;  Laterality: N/A;  . Partial colectomy  06/12/2015    Procedure: PARTIAL COLECTOMY;  Surgeon: Autumn Messing III, MD;  Location: [facility] OR[facility];  Service: General;;    has Colitis; Benign neoplasm of colon; Aspiration into airway; and Cancer of right colon ([place]) on his problem list.    has No Known Allergies.    Medication List       This list is accurate as of: 08/21/15  11:59 PM.  Always use your most recent med list.               capecitabine 150 MG tablet  Commonly known as:  XELODA  Take 1 tablet (150 mg total) by mouth 2 (two) times daily after a meal. Take on days 1-14 of chemotherapy.     capecitabine 500 MG tablet  Commonly known as:  XELODA  Take 3 tablets (1,500 mg total) by mouth 2 (two) times daily after a meal. Take on days 1-14 of chemotherapy.     ondansetron 8 MG tablet  Commonly known as:  ZOFRAN  Take 1 tablet (8 mg total) by mouth 2 (two) times daily as needed for refractory nausea / vomiting. Start on day 3 after chemotherapy.     oxyCODONE-acetaminophen 5-325 MG tablet  Commonly known as:  ROXICET  Take 1-2 tablets by mouth every 4 (four) hours as needed for severe pain.     prochlorperazine 10 MG tablet  Commonly known as:  COMPAZINE  Take 1 tablet (10 mg total) by mouth every 6 (six) hours as needed (Nausea or vomiting).         PHYSICAL EXAMINATION  Oncology Vitals 08/21/2015 08/11/2015  Height 173 cm 173 cm  Weight 62.687 kg 61.553 kg  Weight (lbs) 138 lbs 3 oz 135 lbs 11 oz  BMI (kg/m2) 21.01 kg/m2 20.63 kg/m2  Temp 98.3 98.4  Pulse 70 71  Resp  18 18  SpO2 100 100  BSA (m2) 1.73 m2 1.72 m2   BP Readings from Last 2 Encounters:  08/21/15 125/82  08/11/15 120/81    Physical Exam  Constitutional: He is oriented to person, place, and time and well-developed, well-nourished, and in no distress.  HENT:  Head: Normocephalic and atraumatic.  Eyes: Conjunctivae and EOM are normal. Pupils are equal, round, and reactive to light. Right eye exhibits no discharge. Left eye exhibits no discharge. No scleral icterus.  Neck: Normal range of motion.  Pulmonary/Chest: Effort normal. No respiratory distress.  Musculoskeletal: Normal range of motion. He exhibits no edema or tenderness.  Neurological: He is alert and oriented to person, place, and time. Gait normal.  Skin: Skin is warm.  Psychiatric: Affect normal.    Nursing note and vitals reviewed.   LABORATORY DATA:. Appointment on 08/21/2015  Component Date Value Ref Range Status  . WBC 08/21/2015 9.1  4.0 - 10.3 10e3/uL Final  . NEUT# 08/21/2015 5.2  1.5 - 6.5 10e3/uL Final  . HGB 08/21/2015 13.1  13.0 - 17.1 g/dL Final  . HCT 08/21/2015 39.5  38.4 - 49.9 % Final  . Platelets 08/21/2015 207  140 - 400 10e3/uL Final  . MCV 08/21/2015 90.8  79.3 - 98.0 fL Final  . MCH 08/21/2015 30.2  27.2 - 33.4 pg Final  . MCHC 08/21/2015 33.3  32.0 - 36.0 g/dL Final  . RBC 08/21/2015 4.35  4.20 - 5.82 10e6/uL Final  . RDW 08/21/2015 14.9* 11.0 - 14.6 % Final  . lymph# 08/21/2015 3.0  0.9 - 3.3 10e3/uL Final  . MONO# 08/21/2015 0.7  0.1 - 0.9 10e3/uL Final  . Eosinophils Absolute 08/21/2015 0.2  0.0 - 0.5 10e3/uL Final  . Basophils Absolute 08/21/2015 0.0  0.0 - 0.1 10e3/uL Final  . NEUT% 08/21/2015 57.3  39.0 - 75.0 % Final  . LYMPH% 08/21/2015 33.3  14.0 - 49.0 % Final  . MONO% 08/21/2015 7.3  0.0 - 14.0 % Final  . EOS% 08/21/2015 1.7  0.0 - 7.0 % Final  . BASO% 08/21/2015 0.4  0.0 - 2.0 % Final  . Sodium 08/21/2015 140  136 - 145 mEq/L Final  . Potassium 08/21/2015 3.7  3.5 - 5.1 mEq/L Final  . Chloride 08/21/2015 108  98 - 109 mEq/L Final  . CO2 08/21/2015 22  22 - 29 mEq/L Final  . Glucose 08/21/2015 133  70 - 140 mg/dl Final   Glucose reference range is for nonfasting patients. Fasting glucose reference range is 70- 100.  Marland Kitchen BUN 08/21/2015 10.3  7.0 - 26.0 mg/dL Final  . Creatinine 08/21/2015 0.8  0.7 - 1.3 mg/dL Final  . Total Bilirubin 08/21/2015 0.30  0.20 - 1.20 mg/dL Final  . Alkaline Phosphatase 08/21/2015 80  40 - 150 U/L Final  . AST 08/21/2015 19  5 - 34 U/L Final  . ALT 08/21/2015 44  0 - 55 U/L Final  . Total Protein 08/21/2015 7.1  6.4 - 8.3 g/dL Final  . Albumin 08/21/2015 3.9  3.5 - 5.0 g/dL Final  . Calcium 08/21/2015 8.9  8.4 - 10.4 mg/dL Final  . Anion Gap 08/21/2015 11  3 - 11 mEq/L Final  . EGFR 08/21/2015 >90  >90  ml/min/1.73 m2 Final   eGFR is calculated using the CKD-EPI Creatinine Equation (2009)     RADIOGRAPHIC STUDIES: No results found.  ASSESSMENT/PLAN:    Cancer of right colon [facility]) Patient presented to the [facility] today to receive cycle  2 of his oxaliplatin chemotherapy regimen.  Patient reports that he also has initiated his second cycle of Xeloda oral therapy as well.  He has been instructed to take the Xeloda twice daily on days #1 through 14 of an every three-week cycle.  Patient states that he has been feeling very well recently; and has no new complaints.  He denies any recent fevers or chills.  Blood counts obtained today were within normal limits.  Vital signs were stable and patient was afebrile.  Translator was present for entire visit.  Patient is asking about new disability and FMLA forms.  Patient was advised to bring both forms from his job place; and Dr. Burr Medico will be able to assist with filling these papers out for him.   Patient is scheduled to return home, March 6 for labs and will return on 09/11/2015 for labs, physical, and chemotherapy. Patient stated understanding of all instructions; and was in agreement with this plan of care. The patient knows to call the clinic with any problems, questions or concerns.   Review/collaboration with Dr. Burr Medico regarding all aspects of patient's visit today.   Total time spent with patient was 25 minutes;  with greater than 75 percent of that time spent in face to face counseling regarding patient's symptoms,  and coordination of care and follow up.  Disclaimer:This dictation was prepared with Dragon/digital dictation along with [employer]. Any transcriptional errors that result from this process are unintentional.  Drue Second, NP 08/23/2015

## 2015-08-23 NOTE — Assessment & Plan Note (Signed)
Patient presented to the [facility] today to receive cycle 2 of his oxaliplatin chemotherapy regimen.  Patient reports that he also has initiated his second cycle of Xeloda oral therapy as well.  He has been instructed to take the Xeloda twice daily on days #1 through 14 of an every three-week cycle.  Patient states that he has been feeling very well recently; and has no new complaints.  He denies any recent fevers or chills.  Blood counts obtained today were within normal limits.  Vital signs were stable and patient was afebrile.  Translator was present for entire visit.  Patient is asking about new disability and FMLA forms.  Patient was advised to bring both forms from his job place; and Dr. Burr Medico will be able to assist with filling these papers out for him.   Patient is scheduled to return home, March 6 for labs and will return on 09/11/2015 for labs, physical, and chemotherapy.

## 2015-08-25 ENCOUNTER — Encounter: Payer: Self-pay | Admitting: Hematology

## 2015-08-25 ENCOUNTER — Encounter: Payer: Self-pay | Admitting: Genetic Counselor

## 2015-08-25 ENCOUNTER — Other Ambulatory Visit: Payer: 59

## 2015-08-25 ENCOUNTER — Ambulatory Visit (HOSPITAL_BASED_OUTPATIENT_CLINIC_OR_DEPARTMENT_OTHER): Payer: 59 | Admitting: Genetic Counselor

## 2015-08-25 DIAGNOSIS — C182 Malignant neoplasm of ascending colon: Secondary | ICD-10-CM | POA: Diagnosis not present

## 2015-08-25 DIAGNOSIS — Z315 Encounter for genetic counseling: Secondary | ICD-10-CM

## 2015-08-25 NOTE — Progress Notes (Signed)
REFERRING PROVIDER: Truitt Merle, MD  PRIMARY PROVIDER:  No PCP Per Patient  PRIMARY REASON FOR VISIT:  1. Cancer of right colon ([place])      HISTORY OF PRESENT ILLNESS:   John Moon, a 45 y.o. male, was seen for a [place] cancer genetics consultation at the request of Dr. Burr Medico due to a personal history of cancer.  John Moon presents to clinic today with his wife and a translator to discuss the possibility of a hereditary predisposition to cancer, genetic testing, and to further clarify his future cancer risks, as well as potential cancer risks for family members.   In December 2016, at the age of 26, John Moon was diagnosed with adenocarcinoma of the transverse colon. This was treated with partial colectomy and chemotherapy. MSI and IHC testing was performed and was negative.  CANCER HISTORY:  Oncology History   Cancer of right colon [facility])   Staging form: Colon and Rectum, AJCC 7th Edition     Pathologic stage from 06/12/2015: Stage IIIC (T3, N2b, cM0) - Signed by Truitt Merle, MD on 07/07/2015       Cancer of right colon ([place])   06/09/2015 Imaging CT chest abdomen and pelvis showed proximal and a transverse colitis, fluid-filled and dilated transverse colon indicating of obstruction related to a mass at the junction of the transverse and descending colon. trace left pleural effusion.    06/10/2015 Initial Diagnosis Cancer of right colon ([place])   06/10/2015 Procedure Colonoscopy showed a friable ulcerated mucosa in distal to mid transverse with stricture, scope couldn't be advanced beyond the. 11 sessile polyps removed.   06/12/2015 Surgery Right hemicolectomy and lymph node dissection.   06/12/2015 Pathology Results Moderately differentiated colonic adenocarcinoma, 3 cm, G2, located in the transverse colon, LVI(-), perineural invasion (-), T3, margins were negative, 7 out of 30 lymph nodes were positive, polyps were tubular adenoma.   07/31/2015 -  Chemotherapy adjuvant chemo CAPEOX,  oxaliplatin 1 30 mg/m, Xeloda 1000 mg/m on day 1-14, every 21 days     Risk Factors: Colonoscopy: yes; 11-12 tubular adenomas found on colonoscopy. Prostate cancer screening: No   Past Medical History  Diagnosis Date  . Colon cancer [facility])     Past Surgical History  Procedure Laterality Date  . Colonoscopy with propofol N/A 06/10/2015    Procedure: COLONOSCOPY WITH PROPOFOL;  Surgeon: Mauri Pole, MD;  [facility];  Service: Endoscopy;  Laterality: N/A;  . Laparoscopic partial colectomy N/A 06/12/2015    Procedure: LAPAROSCOPIC ASSISTED  PARTIAL COLECTOMY;  Surgeon: Autumn Messing III, MD;  Location[facility];  Service: General;  Laterality: N/A;  . Partial colectomy  06/12/2015    Procedure: PARTIAL COLECTOMY;  Surgeon: Autumn Messing III, MD;  Location: [facility];  Service: General;;    Social History   Social History  . Marital Status: Single    Spouse Name: N/A  . Number of Children: 2  . Years of Education: N/A   Social History Main Topics  . Smoking status: Current Every Day Smoker -- 1.00 packs/day    Types: Cigarettes  . Smokeless tobacco: Never Used  . Alcohol Use: 4.2 oz/week    5 Cans of beer, 2 Standard drinks or equivalent per week  . Drug Use: No  . Sexual Activity: No   Other Topics Concern  . 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  Significant diagnoses are listed below: History reviewed. No pertinent family history.  The patient has two children ages 84 and 20.  He has two maternal half brothers who are healthy and cancer free.  The patient's father was an Estate manager/land agent man during the [place] war. No information is known about him.  His mother is alive at 7.  She has three sisters  and one brother who are cancer free.  His maternal grandparents are deceased of natural causes.  Patient's maternal ancestors are of [place] descent, and paternal ancestors are of American Caucasian descent. There is no reported Ashkenazi Jewish ancestry. There is no known consanguinity.  GENETIC COUNSELING ASSESSMENT: John Moon is a 45 y.o. male with a personal history of colon cancer which is somewhat suggestive of a hereditary cancer syndrome and predisposition to cancer. We, therefore, discussed and recommended the following at today's visit.   DISCUSSION: We discussed that about 5% of colon cancer is hereditary with the most common form being MMR mutations associated with Lynch syndrome. We reviewed the characteristics, features and inheritance patterns of hereditary colon cancer syndromes. We also discussed genetic testing, including the appropriate family members to test, the process of testing, insurance coverage and turn-around-time for results. We discussed the implications of a negative, positive and/or variant of uncertain significant result. We recommended John Moon pursue genetic testing for the Colorectal cancer panel and the MSH2 inversion.  The Colorectal Cancer Panel offered by [facility] includes sequencing and/or duplication/deletion testing of the following 19 genes: APC, ATM, AXIN2, BMPR1A, CDH1, CHEK2, EPCAM, MLH1, MSH2, MSH6, MUTYH, PMS2, POLD1, POLE, PTEN, SCG5/GREM1, SMAD4, STK11, and TP53.     Based on John Moon personal history of cancer, he meets medical criteria for genetic testing. Despite that he meets criteria, he may still have an out of pocket cost. We discussed that if his out of pocket cost for testing is over $100, the laboratory will call and confirm whether he wants to proceed with testing.  If the out of pocket cost of testing is less than $100 he will be billed by the genetic testing laboratory.   PLAN: After considering the risks, benefits, and limitations,  John Moon  provided informed consent to pursue genetic testing and the blood sample was sent to [facility] for analysis of the Colorectal cancer panel and MSH2 inv. Results should be available within approximately 2-3 weeks' time, at which point they will be disclosed by telephone to John Moon, as will any additional recommendations warranted by these results. John Moon will receive a summary of his genetic counseling visit and a copy of his results once available. This information will also be available in Epic. We encouraged John Moon to remain in contact with cancer genetics annually so that we can continuously update the family history and inform him of any changes in cancer genetics and testing that may be of benefit for his family. John Moon questions were answered to his satisfaction today. Our contact information was provided should additional questions or concerns arise.  Lastly, we encouraged John Moon to remain in contact with cancer genetics annually so that we can continuously update the family history and inform him of any changes in cancer genetics and testing that may be of benefit for this family.   Mr.  Hayward questions were  answered to his satisfaction today. Our contact information was provided should additional questions or concerns arise. Thank you for the referral and allowing [place] to share in the care of your patient.   Matheau Orona P. Florene Glen, [place], [facility] Certified Genetic Counselor Santiago [email or web address] phone: [phone]  The patient was seen for a total of 45 minutes in face-to-face genetic counseling.  This patient was discussed with Drs. Magrinat, Lindi Adie and/or Burr Medico who agrees with the above.    _______________________________________________________________________ For Office Staff:  Number of people involved in session: 3 Was an Intern/ student involved with case: yes

## 2015-09-11 ENCOUNTER — Other Ambulatory Visit (HOSPITAL_BASED_OUTPATIENT_CLINIC_OR_DEPARTMENT_OTHER): Payer: 59

## 2015-09-11 ENCOUNTER — Ambulatory Visit (HOSPITAL_BASED_OUTPATIENT_CLINIC_OR_DEPARTMENT_OTHER): Payer: 59 | Admitting: Nurse Practitioner

## 2015-09-11 ENCOUNTER — Telehealth: Payer: Self-pay | Admitting: Hematology

## 2015-09-11 ENCOUNTER — Ambulatory Visit (HOSPITAL_BASED_OUTPATIENT_CLINIC_OR_DEPARTMENT_OTHER): Payer: 59

## 2015-09-11 VITALS — BP 129/92 | HR 69 | Temp 98.0°F | Resp 20 | Ht 68.0 in | Wt 138.8 lb

## 2015-09-11 DIAGNOSIS — C184 Malignant neoplasm of transverse colon: Secondary | ICD-10-CM | POA: Diagnosis not present

## 2015-09-11 DIAGNOSIS — Z5111 Encounter for antineoplastic chemotherapy: Secondary | ICD-10-CM

## 2015-09-11 DIAGNOSIS — C779 Secondary and unspecified malignant neoplasm of lymph node, unspecified: Secondary | ICD-10-CM

## 2015-09-11 DIAGNOSIS — C182 Malignant neoplasm of ascending colon: Secondary | ICD-10-CM

## 2015-09-11 LAB — COMPREHENSIVE METABOLIC PANEL
ALBUMIN: 3.6 g/dL (ref 3.5–5.0)
ALK PHOS: 80 U/L (ref 40–150)
ALT: 29 U/L (ref 0–55)
AST: 15 U/L (ref 5–34)
Anion Gap: 8 mEq/L (ref 3–11)
BUN: 5.1 mg/dL — AB (ref 7.0–26.0)
CO2: 25 mEq/L (ref 22–29)
Calcium: 9.2 mg/dL (ref 8.4–10.4)
Chloride: 109 mEq/L (ref 98–109)
Creatinine: 0.8 mg/dL (ref 0.7–1.3)
GLUCOSE: 92 mg/dL (ref 70–140)
POTASSIUM: 3.9 meq/L (ref 3.5–5.1)
SODIUM: 142 meq/L (ref 136–145)
TOTAL PROTEIN: 7.3 g/dL (ref 6.4–8.3)

## 2015-09-11 LAB — CBC WITH DIFFERENTIAL/PLATELET
BASO%: 0.6 % (ref 0.0–2.0)
BASOS ABS: 0 10*3/uL (ref 0.0–0.1)
EOS ABS: 0.2 10*3/uL (ref 0.0–0.5)
EOS%: 3.6 % (ref 0.0–7.0)
HEMATOCRIT: 40.7 % (ref 38.4–49.9)
HEMOGLOBIN: 13.5 g/dL (ref 13.0–17.1)
LYMPH#: 3.1 10*3/uL (ref 0.9–3.3)
LYMPH%: 45.6 % (ref 14.0–49.0)
MCH: 30.5 pg (ref 27.2–33.4)
MCHC: 33.1 g/dL (ref 32.0–36.0)
MCV: 92.1 fL (ref 79.3–98.0)
MONO#: 0.6 10*3/uL (ref 0.1–0.9)
MONO%: 8.6 % (ref 0.0–14.0)
NEUT%: 41.6 % (ref 39.0–75.0)
NEUTROS ABS: 2.9 10*3/uL (ref 1.5–6.5)
Platelets: 219 10*3/uL (ref 140–400)
RBC: 4.42 10*6/uL (ref 4.20–5.82)
RDW: 17.4 % — AB (ref 11.0–14.6)
WBC: 6.9 10*3/uL (ref 4.0–10.3)

## 2015-09-11 MED ORDER — PALONOSETRON HCL INJECTION 0.25 MG/5ML
INTRAVENOUS | Status: AC
  Filled 2015-09-11: qty 5

## 2015-09-11 MED ORDER — DEXTROSE 5 % IV SOLN
Freq: Once | INTRAVENOUS | Status: AC
  Administered 2015-09-11: 13:00:00 via INTRAVENOUS

## 2015-09-11 MED ORDER — PALONOSETRON HCL INJECTION 0.25 MG/5ML
0.2500 mg | Freq: Once | INTRAVENOUS | Status: AC
  Administered 2015-09-11: 0.25 mg via INTRAVENOUS

## 2015-09-11 MED ORDER — SODIUM CHLORIDE 0.9 % IV SOLN
10.0000 mg | Freq: Once | INTRAVENOUS | Status: AC
  Administered 2015-09-11: 10 mg via INTRAVENOUS
  Filled 2015-09-11: qty 1

## 2015-09-11 MED ORDER — OXALIPLATIN CHEMO INJECTION 100 MG/20ML
130.0000 mg/m2 | Freq: Once | INTRAVENOUS | Status: AC
  Administered 2015-09-11: 220 mg via INTRAVENOUS
  Filled 2015-09-11: qty 34

## 2015-09-11 NOTE — Patient Instructions (Signed)
[facility] Discharge Instructions for Patients Receiving Chemotherapy  Today you received the following chemotherapy agents:  Oxaliplatin  To help prevent nausea and vomiting after your treatment, we encourage you to take your nausea medication as prescribed.   If you develop nausea and vomiting that is not controlled by your nausea medication, call the clinic.   BELOW ARE SYMPTOMS THAT SHOULD BE REPORTED IMMEDIATELY:  *FEVER GREATER THAN 100.5 F  *CHILLS WITH OR WITHOUT FEVER  NAUSEA AND VOMITING THAT IS NOT CONTROLLED WITH YOUR NAUSEA MEDICATION  *UNUSUAL SHORTNESS OF BREATH  *UNUSUAL BRUISING OR BLEEDING  TENDERNESS IN MOUTH AND THROAT WITH OR WITHOUT PRESENCE OF ULCERS  *URINARY PROBLEMS  *BOWEL PROBLEMS  UNUSUAL RASH Items with * indicate a potential emergency and should be followed up as soon as possible.  Feel free to call the clinic you have any questions or concerns. The clinic phone number is [phone].  Please show the CHEMO ALERT CARD at check-in to the Emergency Department and triage nurse.   

## 2015-09-11 NOTE — Progress Notes (Signed)
  [facility] OFFICE PROGRESS NOTE   Diagnosis:  Colon cancer Oncology History   Cancer of right colon [facility])  Staging form: Colon and Rectum, AJCC 7th Edition  Pathologic stage from 06/12/2015: Stage IIIC (T3, N2b, cM0) - Signed by Truitt Merle, MD on 07/07/2015       Cancer of right colon ([place])   06/09/2015 Imaging CT chest abdomen and pelvis showed proximal and a transverse colitis, fluid-filled and dilated transverse colon indicating of obstruction related to a mass at the junction of the transverse and descending colon. trace left pleural effusion.    06/10/2015 Initial Diagnosis Cancer of right colon ([place])   06/10/2015 Procedure Colonoscopy showed a friable ulcerated mucosa in distal to mid transverse with stricture, scope couldn't be advanced beyond the. 11 sessile polyps removed.   06/12/2015 Surgery Right hemicolectomy and lymph node dissection.   06/12/2015 Pathology Results Moderately differentiated colonic adenocarcinoma, 3 cm, G2, located in the transverse colon, LVI(-), perineural invasion (-), T3, margins were negative, 7 out of 30 lymph nodes were positive, polyps were tubular adenoma.   07/31/2015 -  Chemotherapy adjuvant chemo CAPEOX, oxaliplatin 1 30 mg/m, Xeloda 1000 mg/m on day 1-14, every 21 days         INTERVAL HISTORY:   John Moon returns as scheduled. He completed cycle 2 adjuvant CAPOX beginning 08/21/2015. He denies nausea/vomiting. No mouth sores. No diarrhea. No hand or foot pain or redness. He reports the cold sensitivity typically last 9 or 10 days. No persistent neuropathy symptoms.  Objective:  Vital signs in last 24 hours:  Blood pressure 129/92, pulse 69, temperature 98 F (36.7 C), temperature source Oral, resp. rate 20, height '5\' 8"'$  (1.727 m), weight 138 lb 12.8 oz (62.959 kg), SpO2 100 %.    HEENT: No thrush or ulcers. Resp: Lungs clear bilaterally. Cardio: Regular rate and rhythm. GI: Abdomen  soft and nontender. No hepatomegaly. Vascular: No leg edema.    Lab Results:  Lab Results  Component Value Date   WBC 6.9 09/11/2015   HGB 13.5 09/11/2015   HCT 40.7 09/11/2015   MCV 92.1 09/11/2015   PLT 219 09/11/2015   NEUTROABS 2.9 09/11/2015    Imaging:  No results found.  Medications: I have reviewed the patient's current medications.  Assessment/Plan: 1. Right colon adenocarcinoma, pT3N2bM0, stage IIIB, MSI-stable; adjuvant CAPOX initiated 07/31/2015. Cycle 2 completed 08/21/2015. 2. History of transaminitis with normal liver enzymes on labs today.   Disposition: John Moon appears stable. He has completed 2 cycles of adjuvant CAPOX. He appears to be tolerating the chemotherapy well. Plan to proceed with cycle 3 today as scheduled. He will return for a follow-up visit and cycle 4 in 3 weeks. He will contact the office in the interim with any problems.    Ned Card ANP/GNP-BC   09/11/2015  12:24 PM

## 2015-09-11 NOTE — Telephone Encounter (Signed)
per pof tos ch pt appt-sent MW email to sch trmt-will call pt after reply °

## 2015-09-12 ENCOUNTER — Telehealth: Payer: Self-pay | Admitting: Genetic Counselor

## 2015-09-12 ENCOUNTER — Encounter: Payer: Self-pay | Admitting: Genetic Counselor

## 2015-09-12 DIAGNOSIS — Z1379 Encounter for other screening for genetic and chromosomal anomalies: Secondary | ICD-10-CM | POA: Insufficient documentation

## 2015-09-12 NOTE — Telephone Encounter (Signed)
Tried calling patient but no VM set up.  Called his emergency contact and LM on VM with a vietnamese interpreter.  Asked that they call back to discuss the results.  Left CB number.

## 2015-09-15 ENCOUNTER — Telehealth: Payer: Self-pay | Admitting: *Deleted

## 2015-09-15 NOTE — Telephone Encounter (Signed)
Per staff message and POF I have scheduled appts. Advised scheduler of appts. JMW  

## 2015-09-16 ENCOUNTER — Ambulatory Visit: Payer: Self-pay | Admitting: Genetic Counselor

## 2015-09-16 ENCOUNTER — Telehealth: Payer: Self-pay | Admitting: Genetic Counselor

## 2015-09-16 DIAGNOSIS — C182 Malignant neoplasm of ascending colon: Secondary | ICD-10-CM

## 2015-09-16 DIAGNOSIS — Z1379 Encounter for other screening for genetic and chromosomal anomalies: Secondary | ICD-10-CM

## 2015-09-16 NOTE — Telephone Encounter (Signed)
Using [place] interpreter [phone], I revealed negative genetic test results to the patient.  Discussed that we do not know why he developed colon cancer so young.  Possibly there is another gene we did not test for.  Based on his young age of onset, we recommend that his children get colonoscopies starting 10 years younger than his age of onset. Patient voiced his understanding.

## 2015-09-16 NOTE — Progress Notes (Signed)
HPI: Mr. Tsan was previously seen in the [facility] due to a personal history of cancer and concerns regarding a hereditary predisposition to cancer. Please refer to our prior cancer genetics clinic note for more information regarding Mr. Maqueda medical, social and family histories, and our assessment and recommendations, at the time. Mr. Flatt recent genetic test results were disclosed to him, as were recommendations warranted by these results. These results and recommendations are discussed in more detail below.  FAMILY HISTORY:  We obtained a detailed, 4-generation family history.  Significant diagnoses are listed below: No family history on file.  The patient has two children ages 46 and 46. He has two maternal half brothers who are healthy and cancer free. The patient's father was an Administrator, Civil Service man during the [place] war. No information is known about him. His mother is alive at 46. She has three sisters and one brother who are cancer free. His maternal grandparents are deceased of natural causes. Patient's maternal ancestors are of [place] descent, and paternal ancestors are of American Caucasian descent. There is no reported Ashkenazi Jewish ancestry. There is no known consanguinity.  GENETIC TEST RESULTS: At the time of Mr. Strayer's visit, we recommended he pursue genetic testing of the Colorectal cancer gene panel and MSH2 inversion. The Colorectal Cancer Panel offered by [facility] includes sequencing and/or duplication/deletion testing of the following 19 genes: APC, ATM, AXIN2, BMPR1A, CDH1, CHEK2, EPCAM, MLH1, MSH2, MSH6, MUTYH, PMS2, POLD1, POLE, PTEN, SCG5/GREM1, SMAD4, STK11, and TP53.  The report date is September 12, 2015.  Genetic testing was normal, and did not reveal a deleterious mutation in these genes. The test report has been scanned into EPIC and is located under the Molecular Pathology section of the Results Review tab.   We discussed with  Mr. Mcclaine that since the current genetic testing is not perfect, it is possible there may be a gene mutation in one of these genes that current testing cannot detect, but that chance is small. We also discussed, that it is possible that another gene that has not yet been discovered, or that we have not yet tested, is responsible for the cancer diagnoses in the family, and it is, therefore, important to remain in touch with cancer genetics in the future so that we can continue to offer Mr. Antrim the most up to date genetic testing.   CANCER SCREENING RECOMMENDATIONS: This result is reassuring and indicates that Mr. Brechtel likely does not have an increased risk for a future cancer due to a mutation in one of these genes. This normal test also suggests that Mr. Antkowiak cancer was most likely not due to an inherited predisposition associated with one of these genes.  Most cancers happen by chance and this negative test suggests that his cancer falls into this category.  We, therefore, recommended he continue to follow the cancer management and screening guidelines provided by his oncology and primary healthcare provider.   RECOMMENDATIONS FOR FAMILY MEMBERS: Individuals in this family might be at some increased risk of developing cancer, over the general population risk, simply due to the family history of cancer. We recommended women in this family have a yearly mammogram beginning at age 46, or 46 years younger than the earliest onset of cancer, an an annual clinical breast exam, and perform monthly breast self-exams. We recommend that all individuals in this family have a baseline colonoscopy starting at age 46, which is 26 years younger than the age of colon cancer  onset in the patient.    FOLLOW-UP: Lastly, we discussed with Mr. Grassia that cancer genetics is a rapidly advancing field and it is possible that new genetic tests will be appropriate for him and/or his family members in the future. We  encouraged him to remain in contact with cancer genetics on an annual basis so we can update his personal and family histories and let him know of advances in cancer genetics that may benefit this family.   Our contact number was provided. Mr. Fralix questions were answered to his satisfaction, and he knows he is welcome to call us at anytime with additional questions or concerns.   Roma Kayser, MS, [facility] Certified Genetic Counselor [email or web address]

## 2015-10-02 ENCOUNTER — Telehealth: Payer: Self-pay | Admitting: *Deleted

## 2015-10-02 ENCOUNTER — Telehealth: Payer: Self-pay | Admitting: Hematology

## 2015-10-02 ENCOUNTER — Telehealth: Payer: Self-pay | Admitting: Pharmacist

## 2015-10-02 ENCOUNTER — Ambulatory Visit: Payer: 59

## 2015-10-02 ENCOUNTER — Other Ambulatory Visit: Payer: 59

## 2015-10-02 ENCOUNTER — Encounter: Payer: 59 | Admitting: Hematology

## 2015-10-02 ENCOUNTER — Encounter: Payer: Self-pay | Admitting: Hematology

## 2015-10-02 NOTE — Telephone Encounter (Signed)
Per staff message and POF I have scheduled appts. Advised scheduler of appts. JMW  

## 2015-10-02 NOTE — Progress Notes (Signed)
This encounter was created in error - please disregard.

## 2015-10-02 NOTE — Telephone Encounter (Signed)
cld pt and r/s inf per pof-gave pt 4/17@1 :45

## 2015-10-02 NOTE — Telephone Encounter (Signed)
10/02/15: Received call from [facility]. Xeloda refill has been shipped and should be delivered tomorrow 4/14.   Thank you,  Montel Clock, PharmD, [facility]

## 2015-10-06 ENCOUNTER — Other Ambulatory Visit (HOSPITAL_BASED_OUTPATIENT_CLINIC_OR_DEPARTMENT_OTHER): Payer: 59

## 2015-10-06 ENCOUNTER — Ambulatory Visit (HOSPITAL_BASED_OUTPATIENT_CLINIC_OR_DEPARTMENT_OTHER): Payer: 59 | Admitting: Hematology

## 2015-10-06 ENCOUNTER — Encounter: Payer: Self-pay | Admitting: Hematology

## 2015-10-06 ENCOUNTER — Ambulatory Visit (HOSPITAL_BASED_OUTPATIENT_CLINIC_OR_DEPARTMENT_OTHER): Payer: 59

## 2015-10-06 ENCOUNTER — Other Ambulatory Visit: Payer: Self-pay | Admitting: *Deleted

## 2015-10-06 VITALS — BP 136/85 | HR 76 | Temp 98.6°F | Resp 18

## 2015-10-06 DIAGNOSIS — Z5111 Encounter for antineoplastic chemotherapy: Secondary | ICD-10-CM

## 2015-10-06 DIAGNOSIS — C182 Malignant neoplasm of ascending colon: Secondary | ICD-10-CM

## 2015-10-06 DIAGNOSIS — C779 Secondary and unspecified malignant neoplasm of lymph node, unspecified: Secondary | ICD-10-CM

## 2015-10-06 DIAGNOSIS — C184 Malignant neoplasm of transverse colon: Secondary | ICD-10-CM | POA: Diagnosis not present

## 2015-10-06 LAB — CBC WITH DIFFERENTIAL/PLATELET
BASO%: 0.5 % (ref 0.0–2.0)
Basophils Absolute: 0 10*3/uL (ref 0.0–0.1)
EOS%: 2.5 % (ref 0.0–7.0)
Eosinophils Absolute: 0.2 10*3/uL (ref 0.0–0.5)
HCT: 41.1 % (ref 38.4–49.9)
HGB: 14.3 g/dL (ref 13.0–17.1)
LYMPH%: 34.8 % (ref 14.0–49.0)
MCH: 32.1 pg (ref 27.2–33.4)
MCHC: 34.8 g/dL (ref 32.0–36.0)
MCV: 92.4 fL (ref 79.3–98.0)
MONO#: 0.7 10*3/uL (ref 0.1–0.9)
MONO%: 8.7 % (ref 0.0–14.0)
NEUT%: 53.5 % (ref 39.0–75.0)
NEUTROS ABS: 4 10*3/uL (ref 1.5–6.5)
NRBC: 0 % (ref 0–0)
Platelets: 139 10*3/uL — ABNORMAL LOW (ref 140–400)
RBC: 4.45 10*6/uL (ref 4.20–5.82)
RDW: 17.4 % — AB (ref 11.0–14.6)
WBC: 7.5 10*3/uL (ref 4.0–10.3)
lymph#: 2.6 10*3/uL (ref 0.9–3.3)

## 2015-10-06 LAB — COMPREHENSIVE METABOLIC PANEL
ALT: 52 U/L (ref 0–55)
AST: 28 U/L (ref 5–34)
Albumin: 3.8 g/dL (ref 3.5–5.0)
Alkaline Phosphatase: 84 U/L (ref 40–150)
Anion Gap: 11 mEq/L (ref 3–11)
BILIRUBIN TOTAL: 0.32 mg/dL (ref 0.20–1.20)
BUN: 8.3 mg/dL (ref 7.0–26.0)
CHLORIDE: 106 meq/L (ref 98–109)
CO2: 22 meq/L (ref 22–29)
CREATININE: 0.8 mg/dL (ref 0.7–1.3)
Calcium: 9.3 mg/dL (ref 8.4–10.4)
EGFR: >90 mL/min/{1.73_m2} (ref >90)
GLUCOSE: 145 mg/dL — AB (ref 70–140)
Potassium: 3.4 mEq/L — ABNORMAL LOW (ref 3.5–5.1)
SODIUM: 139 meq/L (ref 136–145)
TOTAL PROTEIN: 7.2 g/dL (ref 6.4–8.3)

## 2015-10-06 MED ORDER — PALONOSETRON HCL INJECTION 0.25 MG/5ML
INTRAVENOUS | Status: AC
  Filled 2015-10-06: qty 5

## 2015-10-06 MED ORDER — DEXTROSE 5 % IV SOLN
Freq: Once | INTRAVENOUS | Status: AC
  Administered 2015-10-06: 14:00:00 via INTRAVENOUS

## 2015-10-06 MED ORDER — OXALIPLATIN CHEMO INJECTION 100 MG/20ML
130.0000 mg/m2 | Freq: Once | INTRAVENOUS | Status: AC
  Administered 2015-10-06: 220 mg via INTRAVENOUS
  Filled 2015-10-06: qty 34

## 2015-10-06 MED ORDER — SODIUM CHLORIDE 0.9 % IV SOLN
10.0000 mg | Freq: Once | INTRAVENOUS | Status: AC
  Administered 2015-10-06: 10 mg via INTRAVENOUS
  Filled 2015-10-06: qty 1

## 2015-10-06 MED ORDER — PALONOSETRON HCL INJECTION 0.25 MG/5ML
0.2500 mg | Freq: Once | INTRAVENOUS | Status: AC
  Administered 2015-10-06: 0.25 mg via INTRAVENOUS

## 2015-10-06 NOTE — Progress Notes (Signed)
Pt arrives for scheduled Oxaliplatin infusion.  Upon assessment pt reporting new onset of numbness to bilateral fingers that is intermittent.  Pt states this started after his last treatment.  Otherwise pt denies any other new side effects or concern.  Dr. Burr Medico, MD notified and okay given to proceed with treatment today.  Dr. Burr Medico to come chairside for further evaluation as last MD appointment was missed.  Also discussed with MD K+ level 3.4.  Pt encouraged to add in potassium rich foods per Dr. Burr Medico.  Pt verbalized understanding with aid of interpreter.

## 2015-10-06 NOTE — Patient Instructions (Signed)
[facility] Discharge Instructions for Patients Receiving Chemotherapy  Today you received the following chemotherapy agents Oxaliplatin  To help prevent nausea and vomiting after your treatment, we encourage you to take your nausea medication as directed.   If you develop nausea and vomiting that is not controlled by your nausea medication, call the clinic.   BELOW ARE SYMPTOMS THAT SHOULD BE REPORTED IMMEDIATELY:  *FEVER GREATER THAN 100.5 F  *CHILLS WITH OR WITHOUT FEVER  NAUSEA AND VOMITING THAT IS NOT CONTROLLED WITH YOUR NAUSEA MEDICATION  *UNUSUAL SHORTNESS OF BREATH  *UNUSUAL BRUISING OR BLEEDING  TENDERNESS IN MOUTH AND THROAT WITH OR WITHOUT PRESENCE OF ULCERS  *URINARY PROBLEMS  *BOWEL PROBLEMS  UNUSUAL RASH Items with * indicate a potential emergency and should be followed up as soon as possible.  Feel free to call the clinic you have any questions or concerns. The clinic phone number is [phone].  Please show the CHEMO ALERT CARD at check-in to the Emergency Department and triage nurse.    

## 2015-10-06 NOTE — Progress Notes (Signed)
[name]  [phone] [phone]  Clinic follow Up Note   Patient Care Team: No Pcp Per Patient as PCP - General (General Practice) 10/06/2015   CHIEF COMPLAINTS:  Follow up stage III right colon cancer  Oncology History   Cancer of right colon [facility])   Staging form: Colon and Rectum, AJCC 7th Edition     Pathologic stage from 06/12/2015: Stage IIIC (T3, N2b, cM0) - Signed by Truitt Merle, MD on 07/07/2015       Cancer of right colon ([place])   06/09/2015 Imaging CT chest abdomen and pelvis showed proximal and a transverse colitis, fluid-filled and dilated transverse colon indicating of obstruction related to a mass at the junction of the transverse and descending colon. trace left pleural effusion.    06/10/2015 Initial Diagnosis Cancer of right colon ([place])   06/10/2015 Procedure Colonoscopy showed a friable ulcerated mucosa in distal to mid transverse with stricture, scope couldn't be advanced beyond the. 11 sessile polyps removed.   06/12/2015 Surgery Right hemicolectomy and lymph node dissection.   06/12/2015 Pathology Results Moderately differentiated colonic adenocarcinoma, 3 cm, G2, located in the transverse colon, LVI(-), perineural invasion (-), T3, margins were negative, 7 out of 30 lymph nodes were positive, polyps were tubular adenoma.   07/31/2015 -  Chemotherapy adjuvant chemo CAPEOX, oxaliplatin 1 30 mg/m, Xeloda 1000 mg/m on day 1-14, every 21 days    HISTORY OF PRESENTING ILLNESS:  John Moon 45 y.o. male is here because of His recently diagnosed stage III colon cancer. He is accompanied by his wife, half-brother, son, and mother to the clinic today.   He presented to [facility] emergency room with sudden onset abdominal pain and nausea for 2 days on 06/09/2015. CT scan reviewed follow-up suction and I palpable mass at the junction of the transverse and descending colon. He was admitted to the hospital, underwent colonoscopy on  06/10/2015, which showed a friable ulcerated mass in distal to mid transverse colon with structure, scope was not able to advance beyond that. The biopsy of the colon mass showed adenocarcinoma. He also had at least 11 since I polyps removed it he underwent right hemicolectomy and lymph node dissection by Dr. Marlou Starks on 06/12/2015. He tolerated the surgery very well, was discharged home on 06/17/2015.   He has recovered well from his surgery, denies any pain, nausea, change of his bowel habits. He has good appetite and energy level. No other complaints. 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   CURRENT THERAPY:  adjuvant chemo Xeloda '1650mg'$  bid on day 1-14, and oxaliplatin '130mg'$ /m2 on day 1, every 21 days, started on 07/31/2015  INTERIM HISTORY: John Moon returns for follow up and cycle 4 chemotherapy. He has been tolerating chemotherapy well overall. He has had transient numbness on his fingers after chemotherapy for a few times, which lasted about a few hours, resolved afterwards. No tingling or pain. He has mild fatigue, able to tolerate 4 time work. He denies any other noticeable side effects, no fever or chills, no nausea or diarrhea, his weight is stable.  MEDICAL HISTORY:  Past Medical History  Diagnosis Date  . Colon cancer [facility])     SURGICAL HISTORY: Past Surgical History  Procedure Laterality Date  . Colonoscopy with propofol N/A 06/10/2015    Procedure: COLONOSCOPY WITH PROPOFOL;  Surgeon: Mauri Pole, MD;  Location: [facility] ENDOSCOPY;  Service: Endoscopy;  Laterality: N/A;  . Laparoscopic partial colectomy N/A 06/12/2015    Procedure: LAPAROSCOPIC ASSISTED  PARTIAL COLECTOMY;  Surgeon: Autumn Messing III, MD;  Location: [facility] ORS;  Service: General;  Laterality: N/A;  . Partial colectomy  06/12/2015    Procedure: PARTIAL COLECTOMY;  Surgeon: Autumn Messing III, MD;  Location: [facility] ORS;  Service: General;;    SOCIAL HISTORY: Social  History   Social History  . Marital Status: Married     Spouse Name: N/A  . Number of Children: 2  . Years of Education: N/A   Occupational History  . Not on file.   Social History Main Topics  . Smoking status: Current Every Day Smoker -- 1.00 packs/day    Types: Cigarettes  . Smokeless tobacco: Never Used  . Alcohol Use: 4.2 oz/week    2 Standard drinks or equivalent, 5 Cans of beer per week  . Drug Use: No  . Sexual Activity: No   Other Topics Concern  . 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 No pertinent family history. no family history of colon cancer or other malignancy.   ALLERGIES:  has No Known Allergies.  MEDICATIONS:  Current Outpatient Prescriptions  Medication Sig Dispense Refill  . capecitabine (XELODA) 150 MG tablet Take 1 tablet (150 mg total) by mouth 2 (two) times daily after a meal. Take on days 1-14 of chemotherapy. 28 tablet 3  . capecitabine (XELODA) 500 MG tablet Take 3 tablets (1,500 mg total) by mouth 2 (two) times daily after a meal. Take on days 1-14 of chemotherapy. 84 tablet 3  . ondansetron (ZOFRAN) 8 MG tablet Take 1 tablet (8 mg total) by mouth 2 (two) times daily as needed for refractory nausea / vomiting. Start on day 3 after chemotherapy. (Patient not taking: Reported on 08/11/2015) 30 tablet 1  . oxyCODONE-acetaminophen (ROXICET) 5-325 MG tablet Take 1-2 tablets by mouth every 4 (four) hours as needed for severe pain. (Patient not taking: Reported on 08/11/2015) 30 tablet 0  . prochlorperazine (COMPAZINE) 10 MG tablet Take 1 tablet (10 mg total) by mouth every 6 (six) hours as needed (Nausea or vomiting). (Patient not taking: Reported on 08/11/2015) 30 tablet 1   No current facility-administered medications for  this visit.   Facility-Administered Medications Ordered in Other Visits  Medication Dose Route Frequency Provider Last Rate Last Dose  . oxaliplatin (ELOXATIN) 220 mg in dextrose 5 % 500 mL chemo infusion  130 mg/m2 (Treatment Plan Actual) Intravenous Once Truitt Merle, MD 272 mL/hr at 10/06/15 1544 220 mg at 10/06/15 1544    REVIEW OF SYSTEMS:   Constitutional: Denies fevers, chills or abnormal night sweats Eyes: Denies blurriness of vision, double vision or watery eyes Ears, nose, mouth, throat, and face: Denies mucositis or sore throat Respiratory: Denies cough, dyspnea or wheezes Cardiovascular: Denies palpitation, chest discomfort or lower extremity swelling Gastrointestinal:  Denies nausea, heartburn or change in bowel habits Skin: Denies abnormal skin rashes Lymphatics: Denies new lymphadenopathy or easy bruising Neurological:Denies numbness, tingling or new weaknesses Behavioral/Psych: Mood is stable, no new changes  All other systems were reviewed with the patient and are negative.  PHYSICAL EXAMINATION: ECOG PERFORMANCE STATUS: 0 - Asymptomatic Pressure 136/85, heart rate 76, respiratory rate 18,  temperature 98.6, pulse ox 100% on room be.  GENERAL:alert, no distress and comfortable SKIN: skin color, texture, turgor are normal, no rashes or significant lesions EYES: normal, conjunctiva are pink and non-injected, sclera clear OROPHARYNX:no exudate, no erythema and lips, buccal mucosa, and tongue normal  NECK: supple, thyroid normal size, non-tender, without nodularity LYMPH:  no palpable lymphadenopathy in the cervical, axillary or inguinal LUNGS: clear to auscultation and percussion with normal breathing effort HEART: regular rate & rhythm and no murmurs and no lower extremity edema ABDOMEN:abdomen soft, non-tender and normal bowel sounds, surgical incision sites are well healed. Musculoskeletal:no cyanosis of digits and no clubbing  PSYCH: alert & oriented x 3 with fluent  speech NEURO: no focal motor/sensory deficits  LABORATORY DATA:  I have reviewed the data as listed CBC Latest Ref Rng 10/06/2015 09/11/2015 08/21/2015  WBC 4.0 - 10.3 10e3/uL 7.5 6.9 9.1  Hemoglobin 13.0 - 17.1 g/dL 14.3 13.5 13.1  Hematocrit 38.4 - 49.9 % 41.1 40.7 39.5  Platelets 140 - 400 10e3/uL 139(L) 219 207    Recent Labs  06/11/15 0520 06/13/15 0459 06/16/15 0530  08/21/15 1140 09/11/15 1141 10/06/15 1346  NA 141 136 138  < > 140 142 139  K 3.7 4.0 3.8  < > 3.7 3.9 3.4*  CL 106 99* 103  --   --   --   --   CO2 '26 25 25  '$ < > '22 25 22  '$ GLUCOSE 105* 119* 122*  < > 133 92 145*  BUN '7 8 11  '$ < > 10.3 5.1* 8.3  CREATININE 0.74 0.59* 0.49*  < > 0.8 0.8 0.8  CALCIUM 8.8* 9.0 8.4*  < > 8.9 9.2 9.3  GFRNONAA >60 >60 >60  --   --   --   --   GFRAA >60 >60 >60  --   --   --   --   PROT  --   --   --   < > 7.1 7.3 7.2  ALBUMIN  --   --   --   < > 3.9 3.6 3.8  AST  --   --   --   < > '19 15 28  '$ ALT  --   --   --   < > 44 29 52  ALKPHOS  --   --   --   < > 80 80 84  BILITOT  --   --   --   < > 0.30 <0.30 0.32  < > = values in this interval not displayed.  Pathology report Diagnosis 06/12/2015 Colon, segmental resection for tumor, transverse and right colon MODERATELY DIFFERENTIATED COLONIC ADENOCARCINOMA (3 CM) THE TUMOR INVADE THROUGH THE MUSCULARIS PROPRIA INTO PERICOLONIC TISSUE (T3) ALL MARGINS OF RESECTION ARE NEGATIVE FOR TUMOR METASTATIC COLONIC ADENOCARCINOMA IN SEVEN OF THIRTY LYMPH NODES (7/30, N2B) TUBULAR ADENOMA (X1) TUBULAR ADENOMA INVOLVING APPENDIX Microscopic Comment COLON AND RECTUM (INCLUDING TRANS-ANAL RESECTION): Specimen: Right and transverse colon Procedure: segmental resection Tumor site: Transverse colon Specimen integrity: Intact Macroscopic intactness of mesorectum: Not applicable: _ Macroscopic tumor perforation: Invading through the muscularis propria into pericolonic soft tissue Invasive tumor: Maximum size: 3 cm Histologic type(s):  Adenocarcinoma Histologic grade and differentiation: G2 G1: well differentiated/low grade G2: moderately differentiated/low grade G3: poorly differentiated/high grade G4: undifferentiated/high grade Type of polyp in which invasive carcinoma arose: Tubular adenoma Microscopic extension of invasive tumor: Invading through the muscularis propria into pericolonic soft tissue Lymph-Vascular invasion: Negative Peri-neural invasion: Negative 1 of 3  FINAL for [place], Dahl V ([id number]) Microscopic Comment(continued) Tumor deposit(s) (discontinuous extramural extension): Negative Resection margins: Proximal margin: negative Distal margin: negative Circumferential (radial) (posterior ascending, posterior descending; lateral and posterior mid-rectum; and entire lower 1/3 rectum): negative Mesenteric margin (sigmoid and transverse): negative Distance closest margin (if all above margins negative): 3.5 cm Treatment effect (neo-adjuvant therapy): Negative Additional polyp(s): Tubular adenoma Non-neoplastic findings: Unremarkable Lymph nodes: number examined 30; number positive: 7 Pathologic Staging: T3, N2b, M_ Ancillary studies: Microsatelite instability PCR ordered Casimer Lanius MD Pathologist, Electronic Signature (Case signed 06/17/2015)    pathology  RADIOGRAPHIC STUDIES: I have personally reviewed the radiological images as listed and agreed with the findings in the report.    ENDOSCOPIC IMPRESSION: 06/10/2015 Friable ulcerated mucosa in distal to mid transverse with stricture, scope couldn't be advanced beyond it, ultiple biopsies taken from the edge of the mass. 11 sessile polyps removed   ASSESSMENT & PLAN:  45 year old [place] male, without significant past medical history, presented with biopsy action.  Colonoscopy showed a ulcerative mass in the transverse colon, and multiple polyps.   1. Right colon adenocarcinoma, pT3N2bM0, stage IIIB, MSI-stable  -I reviewed his CT  scan and surgical path in great details, including the staging, and molecular features.  -I reviewed the nature history of colon cancer, and risk of cancer recurrence after surgery. Due to his locally advanced disease, especially multiple (7) positive nodes, his risk of recurrence is very high.  -I recommended adjuvant chemotherapy to reduce his risk of recurrence, with standard regimen FOLFOX or CAPOX, for a total of 6 months. The benefit, potential side effects and treatment logistics were discussed with him and his family in details, and he opted CAPOX.  -he has been tolerating adjuvant chemotherapy well, lab  results reviewed with patient, CBC and CMP normal except mild thrombocytopenia, we'll continue chemo   2. Transaminitis -Was noticed on routine lab test before chemotherapy, resolved now  -No past medical history of liver disease. 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  3. Mild peripheral neuropathy -He has a mild transient numbness on his fingers after chemotherapy, resolves spontaneously. -We'll continue monitoring closely  Plan -Lab results reviewed with patient, adequate for treatment, we'll proceed cycle 4 CAPEOX  today  -I'll see him back in 3 weeks  All questions were answered. The patient knows to call the clinic with any problems, questions or concerns.   I spent 20 minutes counseling the patient face to face. The total time spent in the appointment was 25 minutes and more than 50% was on counseling.     Truitt Merle, MD 10/06/2015

## 2015-10-23 ENCOUNTER — Other Ambulatory Visit: Payer: 59

## 2015-10-23 ENCOUNTER — Ambulatory Visit: Payer: 59 | Admitting: Hematology

## 2015-10-24 ENCOUNTER — Other Ambulatory Visit: Payer: 59

## 2015-10-24 ENCOUNTER — Ambulatory Visit: Payer: 59 | Admitting: Hematology

## 2015-10-27 ENCOUNTER — Ambulatory Visit: Payer: 59

## 2015-10-27 ENCOUNTER — Encounter: Payer: 59 | Admitting: Hematology

## 2015-10-27 ENCOUNTER — Encounter: Payer: Self-pay | Admitting: Hematology

## 2015-10-27 ENCOUNTER — Telehealth: Payer: Self-pay | Admitting: *Deleted

## 2015-10-27 ENCOUNTER — Other Ambulatory Visit: Payer: 59

## 2015-10-27 ENCOUNTER — Other Ambulatory Visit: Payer: Self-pay | Admitting: *Deleted

## 2015-10-27 NOTE — Telephone Encounter (Signed)
Pt was NO SHOW today for office visit and chemo.   Spoke with pt and was informed that pt forgot.  Stated he did not see any returned appts.   Instructed pt to call office if he did not have any follow up appts .  Gave pt appts date and time for Wed 10/29/15.     Pt voiced understanding.   POF sent to scheduler. Pt also requested refills of Xeloda.   Message to Dr. Burr Medico.

## 2015-10-27 NOTE — Progress Notes (Signed)
This encounter was created in error - please disregard.

## 2015-10-28 ENCOUNTER — Telehealth: Payer: Self-pay | Admitting: *Deleted

## 2015-10-28 NOTE — Telephone Encounter (Signed)
Per staff message and POF I have scheduled appts. Advised scheduler of appts and first available given. JMW  

## 2015-10-29 ENCOUNTER — Encounter: Payer: Self-pay | Admitting: Hematology

## 2015-10-29 ENCOUNTER — Telehealth: Payer: Self-pay | Admitting: Hematology

## 2015-10-29 ENCOUNTER — Ambulatory Visit (HOSPITAL_BASED_OUTPATIENT_CLINIC_OR_DEPARTMENT_OTHER): Payer: 59 | Admitting: Hematology

## 2015-10-29 ENCOUNTER — Telehealth: Payer: Self-pay | Admitting: *Deleted

## 2015-10-29 ENCOUNTER — Other Ambulatory Visit: Payer: Self-pay | Admitting: *Deleted

## 2015-10-29 ENCOUNTER — Other Ambulatory Visit (HOSPITAL_BASED_OUTPATIENT_CLINIC_OR_DEPARTMENT_OTHER): Payer: 59

## 2015-10-29 ENCOUNTER — Encounter: Payer: Self-pay | Admitting: *Deleted

## 2015-10-29 ENCOUNTER — Ambulatory Visit (HOSPITAL_BASED_OUTPATIENT_CLINIC_OR_DEPARTMENT_OTHER): Payer: 59

## 2015-10-29 VITALS — BP 124/82 | HR 77 | Temp 97.9°F | Resp 18 | Wt 141.4 lb

## 2015-10-29 DIAGNOSIS — Z5111 Encounter for antineoplastic chemotherapy: Secondary | ICD-10-CM | POA: Diagnosis not present

## 2015-10-29 DIAGNOSIS — G622 Polyneuropathy due to other toxic agents: Secondary | ICD-10-CM

## 2015-10-29 DIAGNOSIS — C182 Malignant neoplasm of ascending colon: Secondary | ICD-10-CM

## 2015-10-29 DIAGNOSIS — C779 Secondary and unspecified malignant neoplasm of lymph node, unspecified: Secondary | ICD-10-CM

## 2015-10-29 DIAGNOSIS — C184 Malignant neoplasm of transverse colon: Secondary | ICD-10-CM | POA: Diagnosis not present

## 2015-10-29 LAB — CBC WITH DIFFERENTIAL/PLATELET
BASO%: 0.3 % (ref 0.0–2.0)
Basophils Absolute: 0 10*3/uL (ref 0.0–0.1)
EOS ABS: 0.2 10*3/uL (ref 0.0–0.5)
EOS%: 2.1 % (ref 0.0–7.0)
HEMATOCRIT: 40.2 % (ref 38.4–49.9)
HEMOGLOBIN: 13.9 g/dL (ref 13.0–17.1)
LYMPH#: 3.1 10*3/uL (ref 0.9–3.3)
LYMPH%: 39.5 % (ref 14.0–49.0)
MCH: 32.8 pg (ref 27.2–33.4)
MCHC: 34.6 g/dL (ref 32.0–36.0)
MCV: 94.8 fL (ref 79.3–98.0)
MONO#: 0.8 10*3/uL (ref 0.1–0.9)
MONO%: 9.7 % (ref 0.0–14.0)
NEUT%: 48.4 % (ref 39.0–75.0)
NEUTROS ABS: 3.9 10*3/uL (ref 1.5–6.5)
PLATELETS: 151 10*3/uL (ref 140–400)
RBC: 4.24 10*6/uL (ref 4.20–5.82)
RDW: 17.5 % — AB (ref 11.0–14.6)
WBC: 8 10*3/uL (ref 4.0–10.3)

## 2015-10-29 LAB — COMPREHENSIVE METABOLIC PANEL
ALBUMIN: 3.8 g/dL (ref 3.5–5.0)
ALK PHOS: 86 U/L (ref 40–150)
ALT: 40 U/L (ref 0–55)
ANION GAP: 9 meq/L (ref 3–11)
AST: 25 U/L (ref 5–34)
BILIRUBIN TOTAL: 0.32 mg/dL (ref 0.20–1.20)
BUN: 5 mg/dL — ABNORMAL LOW (ref 7.0–26.0)
CO2: 23 mEq/L (ref 22–29)
Calcium: 9.1 mg/dL (ref 8.4–10.4)
Chloride: 107 mEq/L (ref 98–109)
Creatinine: 0.8 mg/dL (ref 0.7–1.3)
GLUCOSE: 158 mg/dL — AB (ref 70–140)
Potassium: 3.5 mEq/L (ref 3.5–5.1)
SODIUM: 139 meq/L (ref 136–145)
TOTAL PROTEIN: 7.1 g/dL (ref 6.4–8.3)

## 2015-10-29 MED ORDER — DEXTROSE 5 % IV SOLN
Freq: Once | INTRAVENOUS | Status: AC
  Administered 2015-10-29: 11:00:00 via INTRAVENOUS

## 2015-10-29 MED ORDER — PALONOSETRON HCL INJECTION 0.25 MG/5ML
0.2500 mg | Freq: Once | INTRAVENOUS | Status: AC
  Administered 2015-10-29: 0.25 mg via INTRAVENOUS

## 2015-10-29 MED ORDER — CAPECITABINE 150 MG PO TABS: 150.0000 mg | ORAL_TABLET | Freq: Two times a day (BID) | ORAL | Status: DC

## 2015-10-29 MED ORDER — PALONOSETRON HCL INJECTION 0.25 MG/5ML
INTRAVENOUS | Status: AC
  Filled 2015-10-29: qty 5

## 2015-10-29 MED ORDER — CAPECITABINE 500 MG PO TABS: 1000.0000 mg/m2 | ORAL_TABLET | Freq: Two times a day (BID) | ORAL | Status: DC

## 2015-10-29 MED ORDER — OXALIPLATIN CHEMO INJECTION 100 MG/20ML
130.0000 mg/m2 | Freq: Once | INTRAVENOUS | Status: AC
  Administered 2015-10-29: 220 mg via INTRAVENOUS
  Filled 2015-10-29: qty 30

## 2015-10-29 MED ORDER — SODIUM CHLORIDE 0.9 % IV SOLN
10.0000 mg | Freq: Once | INTRAVENOUS | Status: AC
  Administered 2015-10-29: 10 mg via INTRAVENOUS
  Filled 2015-10-29: qty 1

## 2015-10-29 NOTE — Patient Instructions (Signed)
[facility] Discharge Instructions for Patients Receiving Chemotherapy  Today you received the following chemotherapy agents Oxaliplatin  To help prevent nausea and vomiting after your treatment, we encourage you to take your nausea medication as directed.   If you develop nausea and vomiting that is not controlled by your nausea medication, call the clinic.   BELOW ARE SYMPTOMS THAT SHOULD BE REPORTED IMMEDIATELY:  *FEVER GREATER THAN 100.5 F  *CHILLS WITH OR WITHOUT FEVER  NAUSEA AND VOMITING THAT IS NOT CONTROLLED WITH YOUR NAUSEA MEDICATION  *UNUSUAL SHORTNESS OF BREATH  *UNUSUAL BRUISING OR BLEEDING  TENDERNESS IN MOUTH AND THROAT WITH OR WITHOUT PRESENCE OF ULCERS  *URINARY PROBLEMS  *BOWEL PROBLEMS  UNUSUAL RASH Items with * indicate a potential emergency and should be followed up as soon as possible.  Feel free to call the clinic you have any questions or concerns. The clinic phone number is [phone].  Please show the CHEMO ALERT CARD at check-in to the Emergency Department and triage nurse.    

## 2015-10-29 NOTE — Progress Notes (Signed)
John Moon  [phone]) [phone] [phone]) [phone]  Clinic follow Up Note   Patient Care Team: No Pcp Per Patient as PCP - General (General Practice) 10/29/2015   CHIEF COMPLAINTS:  Follow up stage Moon right colon cancer  Oncology History   Cancer of right colon [facility])   Staging form: Colon and Rectum, AJCC 7th Edition     Pathologic stage from 06/12/2015: Stage IIIC (T3, N2b, cM0) - Signed by John Merle, MD on 07/07/2015       Cancer of right colon ([place])   06/09/2015 Imaging CT chest abdomen and pelvis showed proximal and a transverse colitis, fluid-filled and dilated transverse colon indicating of obstruction related to a mass at the junction of the transverse and descending colon. trace left pleural effusion.    06/10/2015 Initial Diagnosis Cancer of right colon ([place])   06/10/2015 Procedure Colonoscopy showed a friable ulcerated mucosa in distal to mid transverse with stricture, scope couldn't be advanced beyond the. 11 sessile polyps removed.   06/12/2015 Surgery Right hemicolectomy and lymph node dissection.   06/12/2015 Pathology Results Moderately differentiated colonic adenocarcinoma, 3 cm, G2, located in the transverse colon, LVI(-), perineural invasion (-), T3, margins were negative, 7 out of 30 lymph nodes were positive, polyps were tubular adenoma.   07/31/2015 -  Chemotherapy adjuvant chemo CAPEOX, oxaliplatin 1 30 mg/m, Xeloda 1000 mg/m on day 1-14, every 21 days    HISTORY OF PRESENTING ILLNESS:  John Moon 45 y.o. male is here because of His recently diagnosed stage Moon colon cancer. He is accompanied by his wife, half-brother, son, and mother to the clinic today.   He presented to [facility] emergency room with sudden onset abdominal pain and nausea for 2 days on 06/09/2015. CT scan reviewed follow-up suction and I palpable mass at the junction of the transverse and descending colon. He was admitted to the hospital, underwent colonoscopy on  06/10/2015, which showed a friable ulcerated mass in distal to mid transverse colon with structure, scope was not able to advance beyond that. The biopsy of the colon mass showed adenocarcinoma. He also had at least 11 since I polyps removed it he underwent right hemicolectomy and lymph node dissection by Dr. Marlou Moon on 06/12/2015. He tolerated the surgery very well, was discharged home on 06/17/2015.   He has recovered well from his surgery, denies any pain, nausea, change of his bowel habits. He has good appetite and energy level. No other complaints. 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   CURRENT THERAPY:  adjuvant chemo Xeloda '1650mg'$  bid on day 1-14, and oxaliplatin '130mg'$ /m2 on day 1, every 21 days, started on 07/31/2015  INTERIM HISTORY: John Moon returns for follow up and cycle 5 chemotherapy. He is tolerating chemotherapy well overall. He received his last cycle chemo through a peripheral vein on his right arm, and he complains right arm numbness for 2 weeks. No focal weakness, range of motion was normal, it resolved spontaneously. He has mild fatigue, and nausea, no vomiting, diarrhea or other symptoms. No neuropathy. He has been off work since he started chemotherapy. He feels well overall, weight is stable.   MEDICAL HISTORY:  Past Medical History  Diagnosis Date  . Colon cancer [facility])     SURGICAL HISTORY: Past Surgical History  Procedure Laterality Date  . Colonoscopy with propofol N/A 06/10/2015    Procedure: COLONOSCOPY WITH PROPOFOL;  Surgeon: John Pole,  MD;  Location: [facility] ENDOSCOPY;  Service: Endoscopy;  Laterality: N/A;  . Laparoscopic partial colectomy N/A 06/12/2015    Procedure: LAPAROSCOPIC ASSISTED  PARTIAL COLECTOMY;  Surgeon: John Messing III, MD;  Location: [facility] ORS;  Service: General;  Laterality: N/A;  . Partial colectomy  06/12/2015    Procedure: PARTIAL COLECTOMY;  Surgeon: John Messing III, MD;  Location:  [facility] ORS;  Service: General;;    SOCIAL HISTORY: Social History   Social History  . Marital Status: Married     Spouse Name: N/A  . Number of Children: 2  . Years of Education: N/A   Occupational History  . Not on file.   Social History Main Topics  . Smoking status: Current Every Day Smoker -- 1.00 packs/day    Types: Cigarettes  . Smokeless tobacco: Never Used  . Alcohol Use: 4.2 oz/week    2 Standard drinks or equivalent, 5 Cans of beer per week  . Drug Use: No  . Sexual Activity: No   Other Topics Concern  . 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 No pertinent family history. no family history of colon cancer or other malignancy.   ALLERGIES:  has No Known Allergies.  MEDICATIONS:  Current Outpatient Prescriptions  Medication Sig Dispense Refill  . capecitabine (XELODA) 150 MG tablet Take 1 tablet (150 mg total) by mouth 2 (two) times daily after a meal. Take on days 1-14 of chemotherapy. 28 tablet 3  . capecitabine (XELODA) 500 MG tablet Take 3 tablets (1,500 mg total) by mouth 2 (two) times daily after a meal. Take on days 1-14 of chemotherapy. 84 tablet 3  . ondansetron (ZOFRAN) 8 MG tablet Take 1 tablet (8 mg total) by mouth 2 (two) times daily as needed for refractory nausea / vomiting. Start on day 3 after chemotherapy. (Patient not taking: Reported on 08/11/2015) 30 tablet 1  . oxyCODONE-acetaminophen (ROXICET) 5-325 MG tablet Take 1-2 tablets by mouth every 4 (four) hours as needed for severe pain. (Patient not taking: Reported on 08/11/2015) 30 tablet 0  . prochlorperazine (COMPAZINE) 10 MG tablet Take 1 tablet (10 mg total) by mouth every 6 (six) hours as needed (Nausea or vomiting). (Patient not taking: Reported on 08/11/2015) 30 tablet  1   No current facility-administered medications for this visit.    REVIEW OF SYSTEMS:   Constitutional: Denies fevers, chills or abnormal night sweats Eyes: Denies blurriness of vision, double vision or watery eyes Ears, nose, mouth, throat, and face: Denies mucositis or sore throat Respiratory: Denies cough, dyspnea or wheezes Cardiovascular: Denies palpitation, chest discomfort or lower extremity swelling Gastrointestinal:  Denies nausea, heartburn or change in bowel habits Skin: Denies abnormal skin rashes Lymphatics: Denies new lymphadenopathy or easy bruising Neurological:Denies numbness, tingling or new weaknesses Behavioral/Psych: Mood is stable, no new changes  All other systems were reviewed with the patient and are negative.  PHYSICAL EXAMINATION: ECOG PERFORMANCE STATUS: 0 - Asymptomatic BP 124/82 mmHg  Pulse 77  Temp(Src) 97.9 F (36.6 C) (Oral)  Resp 18  Wt 141 lb 6.4 oz (64.139 kg)  SpO2 100% GENERAL:alert, no distress and comfortable SKIN: skin color, texture, turgor are normal, no rashes or significant lesions EYES: normal, conjunctiva are pink and non-injected, sclera  clear OROPHARYNX:no exudate, no erythema and lips, buccal mucosa, and tongue normal  NECK: supple, thyroid normal size, non-tender, without nodularity LYMPH:  no palpable lymphadenopathy in the cervical, axillary or inguinal LUNGS: clear to auscultation and percussion with normal breathing effort HEART: regular rate & rhythm and no murmurs and no lower extremity edema ABDOMEN:abdomen soft, non-tender and normal bowel sounds, surgical incision sites are well healed. Musculoskeletal:no cyanosis of digits and no clubbing  PSYCH: alert & oriented x 3 with fluent speech NEURO: no focal motor/sensory deficits  LABORATORY DATA:  I have reviewed the data as listed CBC Latest Ref Rng 10/29/2015 10/06/2015 09/11/2015  WBC 4.0 - 10.3 10e3/uL 8.0 7.5 6.9  Hemoglobin 13.0 - 17.1 g/dL 13.9 14.3 13.5    Hematocrit 38.4 - 49.9 % 40.2 41.1 40.7  Platelets 140 - 400 10e3/uL 151 139(L) 219   CMP Latest Ref Rng 10/29/2015 10/06/2015 09/11/2015  Glucose 70 - 140 mg/dl 158(H) 145(H) 92  BUN 7.0 - 26.0 mg/dL 5.0(L) 8.3 5.1(L)  Creatinine 0.7 - 1.3 mg/dL 0.8 0.8 0.8  Sodium 136 - 145 mEq/L 139 139 142  Potassium 3.5 - 5.1 mEq/L 3.5 3.4(L) 3.9  CO2 22 - 29 mEq/L '23 22 25  '$ Calcium 8.4 - 10.4 mg/dL 9.1 9.3 9.2  Total Protein 6.4 - 8.3 g/dL 7.1 7.2 7.3  Total Bilirubin 0.20 - 1.20 mg/dL 0.32 0.32 <0.30  Alkaline Phos 40 - 150 U/L 86 84 80  AST 5 - 34 U/L '25 28 15  '$ ALT 0 - 55 U/L 40 52 29     Pathology report Diagnosis 06/12/2015 Colon, segmental resection for tumor, transverse and right colon MODERATELY DIFFERENTIATED COLONIC ADENOCARCINOMA (3 CM) THE TUMOR INVADE THROUGH THE MUSCULARIS PROPRIA INTO PERICOLONIC TISSUE (T3) ALL MARGINS OF RESECTION ARE NEGATIVE FOR TUMOR METASTATIC COLONIC ADENOCARCINOMA IN SEVEN OF THIRTY LYMPH NODES (7/30, N2B) TUBULAR ADENOMA (X1) TUBULAR ADENOMA INVOLVING APPENDIX Microscopic Comment COLON AND RECTUM (INCLUDING TRANS-ANAL RESECTION): Specimen: Right and transverse colon Procedure: segmental resection Tumor site: Transverse colon Specimen integrity: Intact Macroscopic intactness of mesorectum: Not applicable: _ Macroscopic tumor perforation: Invading through the muscularis propria into pericolonic soft tissue Invasive tumor: Maximum size: 3 cm Histologic type(s): Adenocarcinoma Histologic grade and differentiation: G2 G1: well differentiated/low grade G2: moderately differentiated/low grade G3: poorly differentiated/high grade G4: undifferentiated/high grade Type of polyp in which invasive carcinoma arose: Tubular adenoma Microscopic extension of invasive tumor: Invading through the muscularis propria into pericolonic soft tissue Lymph-Vascular invasion: Negative Peri-neural invasion: Negative 1 of 3 FINAL for Fuhrer, Sadiel V  ([id number]) Microscopic Comment(continued) Tumor deposit(s) (discontinuous extramural extension): Negative Resection margins: Proximal margin: negative Distal margin: negative Circumferential (radial) (posterior ascending, posterior descending; lateral and posterior mid-rectum; and entire lower 1/3 rectum): negative Mesenteric margin (sigmoid and transverse): negative Distance closest margin (if all above margins negative): 3.5 cm Treatment effect (neo-adjuvant therapy): Negative Additional polyp(s): Tubular adenoma Non-neoplastic findings: Unremarkable Lymph nodes: number examined 30; number positive: 7 Pathologic Staging: T3, N2b, M_ Ancillary studies: Microsatelite instability PCR ordered Casimer Lanius MD Pathologist, Electronic Signature (Case signed 06/17/2015)    pathology  RADIOGRAPHIC STUDIES: I have personally reviewed the radiological images as listed and agreed with the findings in the report.    ENDOSCOPIC IMPRESSION: 06/10/2015 Friable ulcerated mucosa in distal to mid transverse with stricture, scope couldn't be advanced beyond it, ultiple biopsies taken from the edge of the mass. 11 sessile polyps removed   ASSESSMENT & PLAN:  45 year old [place] male, without significant past medical history, presented with  biopsy action.  Colonoscopy showed a ulcerative mass in the transverse colon, and multiple polyps.   1. Right colon adenocarcinoma, pT3N2bM0, stage IIIB, MSI-stable  -I reviewed his CT scan and surgical path in great details, including the staging, and molecular features.  -I reviewed the nature history of colon cancer, and risk of cancer recurrence after surgery. Due to his locally advanced disease, especially multiple (7) positive nodes, his risk of recurrence is very high.  -I recommended adjuvant chemotherapy to reduce his risk of recurrence, with standard regimen FOLFOX or CAPOX, for a total of 6 months. The benefit, potential side effects and  treatment logistics were discussed with him and his family in details, and he opted CAPOX.  -he has been tolerating adjuvant chemotherapy well, lab  results reviewed with patient, CBC and CMP normal, we'll continue chemo   2. Transaminitis -Was noticed on routine lab test before chemotherapy, resolved now  -No past medical history of liver disease. 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  3. Mild peripheral neuropathy -He has a mild transient numbness on his fingers after chemotherapy, resolves spontaneously. -We'll continue monitoring closely  Plan -Lab results reviewed with patient, adequate for treatment, we'll proceed cycle 5 CAPEOX  Today. We called in his Xeloda to his pharmacy today, and will be delivered soon.  -I'll see him back in 3 weeks  All questions were answered. The patient knows to call the clinic with any problems, questions or concerns.   I spent 20 minutes counseling the patient face to face. The total time spent in the appointment was 25 minutes and more than 50% was on counseling.     John Merle, MD 10/29/2015

## 2015-10-29 NOTE — Progress Notes (Signed)
Oncology Nurse Navigator Documentation  Oncology Nurse Navigator Flowsheets 10/29/2015  Navigator Location [facility]  Navigator Encounter Type Treatment;3 month  Abnormal Finding Date -  Confirmed Diagnosis Date -  Surgery Date -  Patient Visit Type MedOnc  Treatment Phase Active Tx-CAPEOX #5  Barriers/Navigation Needs No barriers at this time;No Questions;No Needs  Education -  Interventions None required  Referrals -  Education Method -  Support Groups/Services -  Acuity Level 1  Via interpreter he reports doing well, no difficulty obtaining his Xeloda and he is able to drive. Made him aware to call for any questions or concerns.

## 2015-10-29 NOTE — Telephone Encounter (Signed)
Per staff message and POF I have scheduled appts. Advised scheduler of appts and first available given. JMW  

## 2015-10-29 NOTE — Telephone Encounter (Signed)
Gave and printed appt sched and avs fo rpt for May °

## 2015-10-30 LAB — CEA: CEA1: 5.8 ng/mL — AB (ref 0.0–4.7)

## 2015-11-19 ENCOUNTER — Telehealth: Payer: Self-pay | Admitting: Nurse Practitioner

## 2015-11-19 ENCOUNTER — Ambulatory Visit: Payer: 59

## 2015-11-19 ENCOUNTER — Telehealth: Payer: Self-pay | Admitting: *Deleted

## 2015-11-19 ENCOUNTER — Other Ambulatory Visit: Payer: 59

## 2015-11-19 ENCOUNTER — Ambulatory Visit: Payer: 59 | Admitting: Nurse Practitioner

## 2015-11-19 NOTE — Telephone Encounter (Signed)
per pof to sch pt appt-sent [place] email to r/s trmt from 5/31 to 6/6-will call -pt after reply

## 2015-11-19 NOTE — Telephone Encounter (Signed)
Oncology Nurse Navigator Documentation  Oncology Nurse Navigator Flowsheets 11/19/2015  Navigator Location [facility]  Navigator Encounter Type Telephone  Telephone Outgoing Call;Patient Update;Appt Confirmation/Clarification  Abnormal Finding Date -  Confirmed Diagnosis Date -  Surgery Date -  Patient Visit Type -  Treatment Phase Active Tx  Barriers/Navigation Needs Transportation--did not have a car this week  Education -  Interventions Referrals  Referrals Social Work-transportation information  Education Method -  Support Groups/Services -  Acuity Level 2  Time Spent with Patient 15  Called patient to follow up on his [place] today. He reports not having a car and that he called today to reschedule and has not heard from anyone. Would like to reschedule for next week and prefers Tuesday if possible. POF to scheduler. He reports he does not have his Xeloda pills yet for this upcoming cycle. Called [phone] pharmacy: He still has #3 refills on file for his Xeloda. Requested they reach out to him to arrange delivery-needed by 11/25/15. She reports he was called yesterday with no return call. Informed her I just spoke w/him so he should answer them.

## 2015-11-20 ENCOUNTER — Telehealth: Payer: Self-pay | Admitting: Oncology

## 2015-11-20 ENCOUNTER — Telehealth: Payer: Self-pay | Admitting: Hematology

## 2015-11-20 ENCOUNTER — Telehealth: Payer: Self-pay | Admitting: *Deleted

## 2015-11-20 NOTE — Telephone Encounter (Signed)
per pof to sch pt appt-cld & spoke to pt and gave pt timwe & date of appt 7/11@11 °

## 2015-11-20 NOTE — Telephone Encounter (Signed)
Per staff message and POF I have scheduled appts. Advised scheduler of appts. JMW  

## 2015-11-25 ENCOUNTER — Ambulatory Visit (HOSPITAL_BASED_OUTPATIENT_CLINIC_OR_DEPARTMENT_OTHER): Payer: Managed Care, Other (non HMO)

## 2015-11-25 ENCOUNTER — Encounter: Payer: Self-pay | Admitting: Nurse Practitioner

## 2015-11-25 ENCOUNTER — Ambulatory Visit (HOSPITAL_BASED_OUTPATIENT_CLINIC_OR_DEPARTMENT_OTHER): Payer: Managed Care, Other (non HMO) | Admitting: Nurse Practitioner

## 2015-11-25 ENCOUNTER — Telehealth: Payer: Self-pay | Admitting: *Deleted

## 2015-11-25 ENCOUNTER — Telehealth: Payer: Self-pay

## 2015-11-25 ENCOUNTER — Telehealth: Payer: Self-pay | Admitting: Hematology

## 2015-11-25 ENCOUNTER — Other Ambulatory Visit (HOSPITAL_BASED_OUTPATIENT_CLINIC_OR_DEPARTMENT_OTHER): Payer: Managed Care, Other (non HMO)

## 2015-11-25 VITALS — BP 119/83 | HR 75 | Temp 98.4°F | Resp 18 | Wt 139.7 lb

## 2015-11-25 DIAGNOSIS — G622 Polyneuropathy due to other toxic agents: Secondary | ICD-10-CM

## 2015-11-25 DIAGNOSIS — C182 Malignant neoplasm of ascending colon: Secondary | ICD-10-CM | POA: Diagnosis not present

## 2015-11-25 DIAGNOSIS — Z5111 Encounter for antineoplastic chemotherapy: Secondary | ICD-10-CM | POA: Diagnosis not present

## 2015-11-25 LAB — COMPREHENSIVE METABOLIC PANEL
ALBUMIN: 3.9 g/dL (ref 3.5–5.0)
ALT: 28 U/L (ref 0–55)
AST: 19 U/L (ref 5–34)
Alkaline Phosphatase: 89 U/L (ref 40–150)
Anion Gap: 8 mEq/L (ref 3–11)
BUN: 5.4 mg/dL — AB (ref 7.0–26.0)
CALCIUM: 9.2 mg/dL (ref 8.4–10.4)
CHLORIDE: 111 meq/L — AB (ref 98–109)
CO2: 23 mEq/L (ref 22–29)
Creatinine: 0.8 mg/dL (ref 0.7–1.3)
EGFR: >90 mL/min/{1.73_m2} (ref >90)
Glucose: 94 mg/dl (ref 70–140)
POTASSIUM: 3.7 meq/L (ref 3.5–5.1)
Sodium: 141 mEq/L (ref 136–145)
Total Bilirubin: 0.36 mg/dL (ref 0.20–1.20)
Total Protein: 7.4 g/dL (ref 6.4–8.3)

## 2015-11-25 LAB — CBC WITH DIFFERENTIAL/PLATELET
BASO%: 0.3 % (ref 0.0–2.0)
BASOS ABS: 0 10*3/uL (ref 0.0–0.1)
EOS%: 1.4 % (ref 0.0–7.0)
Eosinophils Absolute: 0.1 10*3/uL (ref 0.0–0.5)
HEMATOCRIT: 41.6 % (ref 38.4–49.9)
HEMOGLOBIN: 14.4 g/dL (ref 13.0–17.1)
LYMPH#: 3 10*3/uL (ref 0.9–3.3)
LYMPH%: 38.5 % (ref 14.0–49.0)
MCH: 33.6 pg — AB (ref 27.2–33.4)
MCHC: 34.6 g/dL (ref 32.0–36.0)
MCV: 97 fL (ref 79.3–98.0)
MONO#: 0.7 10*3/uL (ref 0.1–0.9)
MONO%: 9.2 % (ref 0.0–14.0)
NEUT#: 3.9 10*3/uL (ref 1.5–6.5)
NEUT%: 50.6 % (ref 39.0–75.0)
PLATELETS: 131 10*3/uL — AB (ref 140–400)
RBC: 4.29 10*6/uL (ref 4.20–5.82)
RDW: 16.4 % — AB (ref 11.0–14.6)
WBC: 7.7 10*3/uL (ref 4.0–10.3)

## 2015-11-25 MED ORDER — DEXAMETHASONE SODIUM PHOSPHATE 100 MG/10ML IJ SOLN
10.0000 mg | Freq: Once | INTRAMUSCULAR | Status: AC
  Administered 2015-11-25: 10 mg via INTRAVENOUS
  Filled 2015-11-25: qty 1

## 2015-11-25 MED ORDER — DEXTROSE 5 % IV SOLN
130.0000 mg/m2 | Freq: Once | INTRAVENOUS | Status: AC
  Administered 2015-11-25: 220 mg via INTRAVENOUS
  Filled 2015-11-25: qty 34

## 2015-11-25 MED ORDER — DEXTROSE 5 % IV SOLN
Freq: Once | INTRAVENOUS | Status: AC
  Administered 2015-11-25: 12:00:00 via INTRAVENOUS

## 2015-11-25 MED ORDER — PALONOSETRON HCL INJECTION 0.25 MG/5ML
0.2500 mg | Freq: Once | INTRAVENOUS | Status: AC
  Administered 2015-11-25: 0.25 mg via INTRAVENOUS

## 2015-11-25 NOTE — Patient Instructions (Signed)
[facility] Discharge Instructions for Patients Receiving Chemotherapy  Today you received the following chemotherapy agents Oxaliplatin  To help prevent nausea and vomiting after your treatment, we encourage you to take your nausea medication    If you develop nausea and vomiting that is not controlled by your nausea medication, call the clinic.   BELOW ARE SYMPTOMS THAT SHOULD BE REPORTED IMMEDIATELY:  *FEVER GREATER THAN 100.5 F  *CHILLS WITH OR WITHOUT FEVER  NAUSEA AND VOMITING THAT IS NOT CONTROLLED WITH YOUR NAUSEA MEDICATION  *UNUSUAL SHORTNESS OF BREATH  *UNUSUAL BRUISING OR BLEEDING  TENDERNESS IN MOUTH AND THROAT WITH OR WITHOUT PRESENCE OF ULCERS  *URINARY PROBLEMS  *BOWEL PROBLEMS  UNUSUAL RASH Items with * indicate a potential emergency and should be followed up as soon as possible.  Feel free to call the clinic you have any questions or concerns. The clinic phone number is [phone].  Please show the CHEMO ALERT CARD at check-in to the Emergency Department and triage nurse.   

## 2015-11-25 NOTE — Telephone Encounter (Signed)
Per staff message and POF I have scheduled appts. Advised scheduler of appts. JMW  

## 2015-11-25 NOTE — Telephone Encounter (Signed)
Called and spoke with Solmon Ice at [facility] to have patients Xeloda mailed. States patient needs to call to confirm address and insurance information. Patient given number to call while interpretor was here with him.  Direct Number to pharmacy is [phone]

## 2015-11-25 NOTE — Telephone Encounter (Signed)
per pof to sch pt appt-*sent MW email to sch trmt-pt to get updated b4 leaving trmt**

## 2015-11-25 NOTE — Progress Notes (Signed)
  [place] OFFICE PROGRESS NOTE   Diagnosis:  Colon cancer Oncology History   Cancer of right colon [facility])  Staging form: Colon and Rectum, AJCC 7th Edition  Pathologic stage from 06/12/2015: Stage IIIC (T3, N2b, cM0) - Signed by Truitt Merle, MD on 07/07/2015       Cancer of right colon ([place])   06/09/2015 Imaging CT chest abdomen and pelvis showed proximal and a transverse colitis, fluid-filled and dilated transverse colon indicating of obstruction related to a mass at the junction of the transverse and descending colon. trace left pleural effusion.    06/10/2015 Initial Diagnosis Cancer of right colon ([place])   06/10/2015 Procedure Colonoscopy showed a friable ulcerated mucosa in distal to mid transverse with stricture, scope couldn't be advanced beyond the. 11 sessile polyps removed.   06/12/2015 Surgery Right hemicolectomy and lymph node dissection.   06/12/2015 Pathology Results Moderately differentiated colonic adenocarcinoma, 3 cm, G2, located in the transverse colon, LVI(-), perineural invasion (-), T3, margins were negative, 7 out of 30 lymph nodes were positive, polyps were tubular adenoma.   07/31/2015 -  Chemotherapy adjuvant chemo CAPEOX, oxaliplatin 1 30 mg/m, Xeloda 1000 mg/m on day 1-14, every 21 days        INTERVAL HISTORY:   John Moon returns for follow-up. He completed cycle 5 CAPOX beginning 10/29/2015. He missed a follow-up visit last week and was rescheduled to today.  He overall feels well. He denies nausea/vomiting. No mouth sores. No diarrhea. No numbness or tingling in his hands or feet. No hand or foot pain or redness.  Objective:  Vital signs in last 24 hours:  Blood pressure 119/83, pulse 75, temperature 98.4 F (36.9 C), temperature source Oral, resp. rate 18, weight 139 lb 11.2 oz (63.368 kg), SpO2 100 %.    HEENT: No thrush or ulcers. Lungs clear bilaterally. Resp: Lungs clear bilaterally. Cardio:  Regular rate and rhythm. GI: Abdomen soft and nontender. No hepatomegaly. Vascular: No leg edema. Neuro: Vibratory sense minimally decreased over the fingertips per tuning fork exam.    Lab Results:  Lab Results  Component Value Date   WBC 7.7 11/25/2015   HGB 14.4 11/25/2015   HCT 41.6 11/25/2015   MCV 97.0 11/25/2015   PLT 131* 11/25/2015   NEUTROABS 3.9 11/25/2015    Imaging:  No results found.  Medications: I have reviewed the patient's current medications.  Assessment/Plan: 1. Right colon adenocarcinoma, pT3N2bM0, stage IIIB, MSI-stable; adjuvant CAPOX initiated 07/31/2015. Cycle 5 completed 10/29/2015. 2. History of transaminitis. 3. Mild peripheral neuropathy.   Disposition: Mr. [name] appears stable. He has completed 5 cycles of CAPOX. Plan to proceed with cycle 6 today as scheduled. He will return for a follow-up visit and cycle 7 in 3 weeks. He will contact the office in the interim with any problems.  Plan reviewed with Dr. Burr Medico.  Ned Card ANP/GNP-BC   11/25/2015  10:58 AM

## 2015-11-26 ENCOUNTER — Telehealth: Payer: Self-pay | Admitting: *Deleted

## 2015-11-26 ENCOUNTER — Encounter: Payer: Self-pay | Admitting: Pharmacist

## 2015-11-26 NOTE — Telephone Encounter (Signed)
Received prior auth request for xeloda from [employer] & placed in [place]/Managed Care box.

## 2015-11-26 NOTE — Progress Notes (Signed)
I received a call from Fredrich Birks w/ [facility] (ph# [phone]) stating pt has changed insurance to [place] and a prior Josem Kaufmann is required for Xeloda refill. I contacted Christella Scheuermann (ph# [phone]) and they were able to over-ride the quantity and ok'd [facility] to re-process the Rx. Fredrich Birks is aware and will proceed w/ the refill. Kennith Center, Pharm.D., CPP 11/26/2015@4 :[facility]

## 2015-11-27 ENCOUNTER — Encounter: Payer: Self-pay | Admitting: Student in an Organized Health Care Education/Training Program

## 2015-11-27 NOTE — Progress Notes (Signed)
Recd forms from [place] for prior auth for xeloda. See prev notes from [facility]na all already done.

## 2015-12-15 ENCOUNTER — Telehealth: Payer: Self-pay

## 2015-12-15 MED ORDER — CAPECITABINE 500 MG PO TABS: 1000.0000 mg/m2 | ORAL_TABLET | Freq: Two times a day (BID) | ORAL | Status: DC

## 2015-12-15 MED ORDER — CAPECITABINE 150 MG PO TABS: 150.0000 mg | ORAL_TABLET | Freq: Two times a day (BID) | ORAL | Status: DC

## 2015-12-15 NOTE — Telephone Encounter (Signed)
Prescription initially sent to [facility] and we received notification on 12/12/15 that the [facility] is not an approved supplier for this medication to the patient.  The prescription must be sent to [facility]. I contacted [employer] and received the appropriate pharmacy e-scribe site and was advised to e-scribe a new prescription to the pharmacy, they did not want a call in prescription.    Prescriptions e-scribed:   Xeloda 150 mg and 500 mg  Pharmacy information: [facility] [place]  [address]  Prescription will take approximately 24 hours for it to reach the pharmacist for initial dispensing.  Will follow-up based on this time parameter.  Thank you  Henreitta Leber, PharmD Oral Oncology [facility]

## 2015-12-16 ENCOUNTER — Telehealth: Payer: Self-pay | Admitting: Hematology

## 2015-12-16 ENCOUNTER — Encounter: Payer: Self-pay | Admitting: Hematology

## 2015-12-16 ENCOUNTER — Ambulatory Visit (HOSPITAL_BASED_OUTPATIENT_CLINIC_OR_DEPARTMENT_OTHER): Payer: Managed Care, Other (non HMO) | Admitting: Hematology

## 2015-12-16 ENCOUNTER — Ambulatory Visit (HOSPITAL_BASED_OUTPATIENT_CLINIC_OR_DEPARTMENT_OTHER): Payer: Managed Care, Other (non HMO)

## 2015-12-16 VITALS — BP 111/83 | HR 78 | Temp 98.9°F | Resp 18 | Ht 68.0 in | Wt 143.5 lb

## 2015-12-16 DIAGNOSIS — C779 Secondary and unspecified malignant neoplasm of lymph node, unspecified: Secondary | ICD-10-CM

## 2015-12-16 DIAGNOSIS — C184 Malignant neoplasm of transverse colon: Secondary | ICD-10-CM

## 2015-12-16 DIAGNOSIS — C182 Malignant neoplasm of ascending colon: Secondary | ICD-10-CM

## 2015-12-16 DIAGNOSIS — G620 Drug-induced polyneuropathy: Secondary | ICD-10-CM

## 2015-12-16 DIAGNOSIS — Z5111 Encounter for antineoplastic chemotherapy: Secondary | ICD-10-CM | POA: Diagnosis not present

## 2015-12-16 LAB — CBC WITH DIFFERENTIAL/PLATELET
BASO%: 0.4 % (ref 0.0–2.0)
BASOS ABS: 0 10*3/uL (ref 0.0–0.1)
EOS%: 1.5 % (ref 0.0–7.0)
Eosinophils Absolute: 0.2 10*3/uL (ref 0.0–0.5)
HCT: 41.6 % (ref 38.4–49.9)
HGB: 14.1 g/dL (ref 13.0–17.1)
LYMPH#: 3.8 10*3/uL — AB (ref 0.9–3.3)
LYMPH%: 37.2 % (ref 14.0–49.0)
MCH: 33.5 pg — AB (ref 27.2–33.4)
MCHC: 33.8 g/dL (ref 32.0–36.0)
MCV: 99.2 fL — ABNORMAL HIGH (ref 79.3–98.0)
MONO#: 0.8 10*3/uL (ref 0.1–0.9)
MONO%: 7.9 % (ref 0.0–14.0)
NEUT#: 5.4 10*3/uL (ref 1.5–6.5)
NEUT%: 53 % (ref 39.0–75.0)
Platelets: 148 10*3/uL (ref 140–400)
RBC: 4.2 10*6/uL (ref 4.20–5.82)
RDW: 17.7 % — ABNORMAL HIGH (ref 11.0–14.6)
WBC: 10.3 10*3/uL (ref 4.0–10.3)

## 2015-12-16 LAB — COMPREHENSIVE METABOLIC PANEL
ALK PHOS: 99 U/L (ref 40–150)
ALT: 34 U/L (ref 0–55)
AST: 26 U/L (ref 5–34)
Albumin: 4 g/dL (ref 3.5–5.0)
Anion Gap: 9 mEq/L (ref 3–11)
BUN: 4.2 mg/dL — AB (ref 7.0–26.0)
CALCIUM: 9.3 mg/dL (ref 8.4–10.4)
CO2: 23 mEq/L (ref 22–29)
Chloride: 107 mEq/L (ref 98–109)
Creatinine: 0.8 mg/dL (ref 0.7–1.3)
Glucose: 136 mg/dl (ref 70–140)
POTASSIUM: 3.6 meq/L (ref 3.5–5.1)
SODIUM: 139 meq/L (ref 136–145)
Total Bilirubin: 0.31 mg/dL (ref 0.20–1.20)
Total Protein: 7.5 g/dL (ref 6.4–8.3)

## 2015-12-16 MED ORDER — DEXAMETHASONE SODIUM PHOSPHATE 100 MG/10ML IJ SOLN
10.0000 mg | Freq: Once | INTRAMUSCULAR | Status: AC
  Administered 2015-12-16: 10 mg via INTRAVENOUS
  Filled 2015-12-16: qty 1

## 2015-12-16 MED ORDER — CAPECITABINE 500 MG PO TABS: 1500.0000 mg | ORAL_TABLET | Freq: Two times a day (BID) | ORAL | Status: DC

## 2015-12-16 MED ORDER — OXALIPLATIN CHEMO INJECTION 100 MG/20ML
130.0000 mg/m2 | Freq: Once | INTRAVENOUS | Status: AC
  Administered 2015-12-16: 220 mg via INTRAVENOUS
  Filled 2015-12-16: qty 20

## 2015-12-16 MED ORDER — DEXTROSE 5 % IV SOLN
Freq: Once | INTRAVENOUS | Status: AC
  Administered 2015-12-16: 13:00:00 via INTRAVENOUS

## 2015-12-16 MED ORDER — PALONOSETRON HCL INJECTION 0.25 MG/5ML
0.2500 mg | Freq: Once | INTRAVENOUS | Status: AC
  Administered 2015-12-16: 0.25 mg via INTRAVENOUS

## 2015-12-16 MED ORDER — PALONOSETRON HCL INJECTION 0.25 MG/5ML
INTRAVENOUS | Status: AC
  Filled 2015-12-16: qty 5

## 2015-12-16 MED ORDER — CAPECITABINE 150 MG PO TABS: 150.0000 mg | ORAL_TABLET | Freq: Two times a day (BID) | ORAL | Status: DC

## 2015-12-16 NOTE — Progress Notes (Signed)
[place]  [phone] Fax:([phone]) [phone]  Clinic follow Up Note   Patient Care Team: No Pcp Per Patient as PCP - General (General Practice) 12/16/2015   CHIEF COMPLAINTS:  Follow up stage III right colon cancer  Oncology History   Cancer of right colon [facility])   Staging form: Colon and Rectum, AJCC 7th Edition     Pathologic stage from 06/12/2015: Stage IIIC (T3, N2b, cM0) - Signed by Truitt Merle, MD on 07/07/2015       Cancer of right colon ([facility])   06/09/2015 Imaging CT chest abdomen and pelvis showed proximal and a transverse colitis, fluid-filled and dilated transverse colon indicating of obstruction related to a mass at the junction of the transverse and descending colon. trace left pleural effusion.    06/10/2015 Initial Diagnosis Cancer of right colon ([place])   06/10/2015 Procedure Colonoscopy showed a friable ulcerated mucosa in distal to mid transverse with stricture, scope couldn't be advanced beyond the. 11 sessile polyps removed.   06/12/2015 Surgery Right hemicolectomy and lymph node dissection.   06/12/2015 Pathology Results Moderately differentiated colonic adenocarcinoma, 3 cm, G2, located in the transverse colon, LVI(-), perineural invasion (-), T3, margins were negative, 7 out of 30 lymph nodes were positive, polyps were tubular adenoma.   07/31/2015 -  Chemotherapy adjuvant chemo CAPEOX, oxaliplatin 1 30 mg/m, Xeloda 1000 mg/m on day 1-14, every 21 days    HISTORY OF PRESENTING ILLNESS:  John Moon 45 y.o. male is here because of His recently diagnosed stage III colon cancer. He is accompanied by his wife, half-brother, son, and mother to the clinic today.   He presented to [facility] emergency room with sudden onset abdominal pain and nausea for 2 days on 06/09/2015. CT scan reviewed follow-up suction and I palpable mass at the junction of the transverse and descending colon. He was admitted to the hospital, underwent colonoscopy on  06/10/2015, which showed a friable ulcerated mass in distal to mid transverse colon with structure, scope was not able to advance beyond that. The biopsy of the colon mass showed adenocarcinoma. He also had at least 11 since I polyps removed it he underwent right hemicolectomy and lymph node dissection by Dr. Marlou Starks on 06/12/2015. He tolerated the surgery very well, was discharged home on 06/17/2015.   He has recovered well from his surgery, denies any pain, nausea, change of his bowel habits. He has good appetite and energy level. No other complaints. 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   CURRENT THERAPY:  adjuvant chemo Xeloda '1650mg'$  bid on day 1-14, and oxaliplatin '130mg'$ /m2 on day 1, every 21 days, started on 07/31/2015  INTERIM HISTORY: [name] returns for follow up and cycle 7 chemotherapy. He is tolerating chemotherapy well overall. He has mild fatigue, able to function well at home. He has been off work since he started the chemotherapy. No significant nausea, diarrhea, or other complaints.Marland Kitchen   MEDICAL HISTORY:  Past Medical History  Diagnosis Date  . Colon cancer [facility])     SURGICAL HISTORY: Past Surgical History  Procedure Laterality Date  . Colonoscopy with propofol N/A 06/10/2015    Procedure: COLONOSCOPY WITH PROPOFOL;  Surgeon: Mauri Pole, MD;  Location: [facility] ENDOSCOPY;  Service: Endoscopy;  Laterality: N/A;  . Laparoscopic partial colectomy N/A 06/12/2015    Procedure: LAPAROSCOPIC ASSISTED  PARTIAL COLECTOMY;  Surgeon: Autumn Messing III, MD;  Location: [facility] ORS;  Service: General;  Laterality: N/A;  . Partial colectomy  06/12/2015    Procedure: PARTIAL COLECTOMY;  Surgeon: Autumn Messing III, MD;  Location: [facility] ORS;  Service: General;;    SOCIAL HISTORY: Social History   Social History  . Marital Status: Married     Spouse Name: N/A  . Number of Children: 2  . Years of Education: N/A   Occupational History  .  Not on file.   Social History Main Topics  . Smoking status: Current Every Day Smoker -- 1.00 packs/day    Types: Cigarettes  . Smokeless tobacco: Never Used  . Alcohol Use: 4.2 oz/week    2 Standard drinks or equivalent, 5 Cans of beer per week  . Drug Use: No  . Sexual Activity: No   Other Topics Concern  . 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 No pertinent family history. no family history of colon cancer or other malignancy.   ALLERGIES:  has No Known Allergies.  MEDICATIONS:  Current Outpatient Prescriptions  Medication Sig Dispense Refill  . capecitabine (XELODA) 150 MG tablet Take 1 tablet (150 mg total) by mouth 2 (two) times daily after a meal. Take on days 1-14 of chemotherapy. 28 tablet 3  . capecitabine (XELODA) 500 MG tablet Take 3 tablets (1,500 mg total) by mouth 2 (two) times daily after a meal. Take on days 1-14 of chemotherapy. 84 tablet 3  . ondansetron (ZOFRAN) 8 MG tablet Take 1 tablet (8 mg total) by mouth 2 (two) times daily as needed for refractory nausea / vomiting. Start on day 3 after chemotherapy. (Patient not taking: Reported on 08/11/2015) 30 tablet 1  . oxyCODONE-acetaminophen (ROXICET) 5-325 MG tablet Take 1-2 tablets by mouth every 4 (four) hours as needed for severe pain. (Patient not taking: Reported on 08/11/2015) 30 tablet 0  . prochlorperazine (COMPAZINE) 10 MG tablet Take 1 tablet (10 mg total) by mouth every 6 (six) hours as needed (Nausea or vomiting). (Patient not taking: Reported on 08/11/2015) 30 tablet 1   No current facility-administered medications for this visit.    REVIEW OF SYSTEMS:   Constitutional: Denies fevers, chills or abnormal night sweats Eyes: Denies blurriness of vision, double vision or watery  eyes Ears, nose, mouth, throat, and face: Denies mucositis or sore throat Respiratory: Denies cough, dyspnea or wheezes Cardiovascular: Denies palpitation, chest discomfort or lower extremity swelling Gastrointestinal:  Denies nausea, heartburn or change in bowel habits Skin: Denies abnormal skin rashes Lymphatics: Denies new lymphadenopathy or easy bruising Neurological:Denies numbness, tingling or new weaknesses Behavioral/Psych: Mood is stable, no new changes  All other systems were reviewed with the patient and are negative.  PHYSICAL EXAMINATION: ECOG PERFORMANCE STATUS: 0 - Asymptomatic BP 111/83 mmHg  Pulse 78  Temp(Src) 98.9 F (37.2 C) (Oral)  Resp 18  Ht '5\' 8"'$  (1.727 m)  Wt 143 lb 8 oz (65.091 kg)  BMI 21.82 kg/m2  SpO2 100% GENERAL:alert, no distress and comfortable SKIN: skin color, texture, turgor are normal, no rashes or significant lesions EYES: normal, conjunctiva are pink and non-injected, sclera clear OROPHARYNX:no exudate, no erythema and lips, buccal mucosa, and tongue normal  NECK: supple, thyroid normal size, non-tender, without nodularity LYMPH:  no palpable lymphadenopathy in the  cervical, axillary or inguinal LUNGS: clear to auscultation and percussion with normal breathing effort HEART: regular rate & rhythm and no murmurs and no lower extremity edema ABDOMEN:abdomen soft, non-tender and normal bowel sounds, surgical incision sites are well healed. Musculoskeletal:no cyanosis of digits and no clubbing  PSYCH: alert & oriented x 3 with fluent speech NEURO: no focal motor/sensory deficits  LABORATORY DATA:  I have reviewed the data as listed CBC Latest Ref Rng 12/16/2015 11/25/2015 10/29/2015  WBC 4.0 - 10.3 10e3/uL 10.3 7.7 8.0  Hemoglobin 13.0 - 17.1 g/dL 14.1 14.4 13.9  Hematocrit 38.4 - 49.9 % 41.6 41.6 40.2  Platelets 140 - 400 10e3/uL 148 131(L) 151   CMP Latest Ref Rng 12/16/2015 11/25/2015 10/29/2015  Glucose 70 - 140 mg/dl 136 94 158(H)  BUN 7.0 -  26.0 mg/dL 4.2(L) 5.4(L) 5.0(L)  Creatinine 0.7 - 1.3 mg/dL 0.8 0.8 0.8  Sodium 136 - 145 mEq/L 139 141 139  Potassium 3.5 - 5.1 mEq/L 3.6 3.7 3.5  CO2 22 - 29 mEq/L '23 23 23  '$ Calcium 8.4 - 10.4 mg/dL 9.3 9.2 9.1  Total Protein 6.4 - 8.3 g/dL 7.5 7.4 7.1  Total Bilirubin 0.20 - 1.20 mg/dL 0.31 0.36 0.32  Alkaline Phos 40 - 150 U/L 99 89 86  AST 5 - 34 U/L '26 19 25  '$ ALT 0 - 55 U/L 34 28 40     Pathology report Diagnosis 06/12/2015 Colon, segmental resection for tumor, transverse and right colon MODERATELY DIFFERENTIATED COLONIC ADENOCARCINOMA (3 CM) THE TUMOR INVADE THROUGH THE MUSCULARIS PROPRIA INTO PERICOLONIC TISSUE (T3) ALL MARGINS OF RESECTION ARE NEGATIVE FOR TUMOR METASTATIC COLONIC ADENOCARCINOMA IN SEVEN OF THIRTY LYMPH NODES (7/30, N2B) TUBULAR ADENOMA (X1) TUBULAR ADENOMA INVOLVING APPENDIX Microscopic Comment COLON AND RECTUM (INCLUDING TRANS-ANAL RESECTION): Specimen: Right and transverse colon Procedure: segmental resection Tumor site: Transverse colon Specimen integrity: Intact Macroscopic intactness of mesorectum: Not applicable: _ Macroscopic tumor perforation: Invading through the muscularis propria into pericolonic soft tissue Invasive tumor: Maximum size: 3 cm Histologic type(s): Adenocarcinoma Histologic grade and differentiation: G2 G1: well differentiated/low grade G2: moderately differentiated/low grade G3: poorly differentiated/high grade G4: undifferentiated/high grade Type of polyp in which invasive carcinoma arose: Tubular adenoma Microscopic extension of invasive tumor: Invading through the muscularis propria into pericolonic soft tissue Lymph-Vascular invasion: Negative Peri-neural invasion: Negative 1 of 3 FINAL for Rudie, Andruw V ([id number]) Microscopic Comment(continued) Tumor deposit(s) (discontinuous extramural extension): Negative Resection margins: Proximal margin: negative Distal margin: negative Circumferential (radial)  (posterior ascending, posterior descending; lateral and posterior mid-rectum; and entire lower 1/3 rectum): negative Mesenteric margin (sigmoid and transverse): negative Distance closest margin (if all above margins negative): 3.5 cm Treatment effect (neo-adjuvant therapy): Negative Additional polyp(s): Tubular adenoma Non-neoplastic findings: Unremarkable Lymph nodes: number examined 30; number positive: 7 Pathologic Staging: T3, N2b, M_ Ancillary studies: Microsatelite instability PCR ordered Casimer Lanius MD Pathologist, Electronic Signature (Case signed 06/17/2015)    pathology  RADIOGRAPHIC STUDIES: I have personally reviewed the radiological images as listed and agreed with the findings in the report.    ENDOSCOPIC IMPRESSION: 06/10/2015 Friable ulcerated mucosa in distal to mid transverse with stricture, scope couldn't be advanced beyond it, ultiple biopsies taken from the edge of the mass. 11 sessile polyps removed   ASSESSMENT & PLAN:  45 year old [place] male, without significant past medical history, presented with biopsy action.  Colonoscopy showed a ulcerative mass in the transverse colon, and multiple polyps.   1. Right colon adenocarcinoma, pT3N2bM0, stage IIIB, MSI-stable  -I reviewed his  CT scan and surgical path in great details, including the staging, and molecular features.  -I reviewed the nature history of colon cancer, and risk of cancer recurrence after surgery. Due to his locally advanced disease, especially multiple (7) positive nodes, his risk of recurrence is very high.  -He is adjuvant chemo with CAPEOX -he has been tolerating adjuvant chemotherapy well, lab  results reviewed with patient, CBC and CMP normal, we'll continue chemo, cycle 7 today -He has not received Xeloda for this cycle yet, we called his pharmacy today. He will start [place] once he receives the medication.  2. Mild peripheral neuropathy -He has a mild transient numbness on his fingers  after chemotherapy, resolves spontaneously. Overall stable  -We'll continue monitoring closely  Plan -Lab results reviewed with patient, adequate for treatment, we'll proceed cycle 7 CAPEOX  Today. We called in his Xeloda to his pharmacy today, and will be delivered soon. He will start as soon as he receives it  -I'll see him back in 3 weeks for last cycle chemo   All questions were answered. The patient knows to call the clinic with any problems, questions or concerns.   I spent 15 minutes counseling the patient face to face. The total time spent in the appointment was 20 minutes and more than 50% was on counseling.     Truitt Merle, MD 12/16/2015

## 2015-12-16 NOTE — Patient Instructions (Signed)
[facility] Discharge Instructions for Patients Receiving Chemotherapy  Today you received the following chemotherapy agents Oxaliplatin   To help prevent nausea and vomiting after your treatment, we encourage you to take your nausea medication as directed. NO ZOFRAN FOR 3 DAYS.   If you develop nausea and vomiting that is not controlled by your nausea medication, call the clinic.   BELOW ARE SYMPTOMS THAT SHOULD BE REPORTED IMMEDIATELY:  *FEVER GREATER THAN 100.5 F  *CHILLS WITH OR WITHOUT FEVER  NAUSEA AND VOMITING THAT IS NOT CONTROLLED WITH YOUR NAUSEA MEDICATION  *UNUSUAL SHORTNESS OF BREATH  *UNUSUAL BRUISING OR BLEEDING  TENDERNESS IN MOUTH AND THROAT WITH OR WITHOUT PRESENCE OF ULCERS  *URINARY PROBLEMS  *BOWEL PROBLEMS  UNUSUAL RASH Items with * indicate a potential emergency and should be followed up as soon as possible.  Feel free to call the clinic you have any questions or concerns. The clinic phone number is [phone].  Please show the CHEMO ALERT CARD at check-in to the Emergency Department and triage nurse.   

## 2015-12-16 NOTE — Telephone Encounter (Signed)
per pf to sch pt appt-gave pt copy of avs °

## 2015-12-18 ENCOUNTER — Encounter: Payer: Self-pay | Admitting: Pharmacist

## 2015-12-18 NOTE — Progress Notes (Signed)
I contacted [facility] (ph# [phone]) & they stated Xeloda shipped to pt on 12/16/15 & he should've received yesterday. Kennith Center, Pharm.D., CPP 12/18/2015@1 :24 PM Oral Chemo Clinic

## 2016-01-06 ENCOUNTER — Other Ambulatory Visit (HOSPITAL_BASED_OUTPATIENT_CLINIC_OR_DEPARTMENT_OTHER): Payer: Managed Care, Other (non HMO)

## 2016-01-06 ENCOUNTER — Ambulatory Visit (HOSPITAL_BASED_OUTPATIENT_CLINIC_OR_DEPARTMENT_OTHER): Payer: Managed Care, Other (non HMO)

## 2016-01-06 ENCOUNTER — Encounter: Payer: Self-pay | Admitting: Hematology

## 2016-01-06 ENCOUNTER — Telehealth: Payer: Self-pay | Admitting: Hematology

## 2016-01-06 ENCOUNTER — Ambulatory Visit (HOSPITAL_BASED_OUTPATIENT_CLINIC_OR_DEPARTMENT_OTHER): Payer: Managed Care, Other (non HMO) | Admitting: Hematology

## 2016-01-06 VITALS — BP 118/84 | HR 70 | Temp 98.2°F | Resp 17 | Ht 68.0 in | Wt 140.2 lb

## 2016-01-06 DIAGNOSIS — R530 Neoplastic (malignant) related fatigue: Secondary | ICD-10-CM

## 2016-01-06 DIAGNOSIS — C182 Malignant neoplasm of ascending colon: Secondary | ICD-10-CM

## 2016-01-06 DIAGNOSIS — D6959 Other secondary thrombocytopenia: Secondary | ICD-10-CM | POA: Diagnosis not present

## 2016-01-06 DIAGNOSIS — Z720 Tobacco use: Secondary | ICD-10-CM

## 2016-01-06 DIAGNOSIS — G622 Polyneuropathy due to other toxic agents: Secondary | ICD-10-CM

## 2016-01-06 DIAGNOSIS — Z5111 Encounter for antineoplastic chemotherapy: Secondary | ICD-10-CM

## 2016-01-06 LAB — COMPREHENSIVE METABOLIC PANEL
ALT: 26 U/L (ref 0–55)
ANION GAP: 8 meq/L (ref 3–11)
AST: 22 U/L (ref 5–34)
Albumin: 3.8 g/dL (ref 3.5–5.0)
Alkaline Phosphatase: 99 U/L (ref 40–150)
BILIRUBIN TOTAL: 0.52 mg/dL (ref 0.20–1.20)
BUN: 4.7 mg/dL — ABNORMAL LOW (ref 7.0–26.0)
CALCIUM: 9 mg/dL (ref 8.4–10.4)
CHLORIDE: 109 meq/L (ref 98–109)
CO2: 23 mEq/L (ref 22–29)
CREATININE: 0.8 mg/dL (ref 0.7–1.3)
EGFR: >90 mL/min/{1.73_m2} (ref >90)
Glucose: 108 mg/dl (ref 70–140)
Potassium: 3.3 mEq/L — ABNORMAL LOW (ref 3.5–5.1)
Sodium: 139 mEq/L (ref 136–145)
Total Protein: 7.4 g/dL (ref 6.4–8.3)

## 2016-01-06 LAB — CBC WITH DIFFERENTIAL/PLATELET
BASO%: 0.2 % (ref 0.0–2.0)
BASOS ABS: 0 10*3/uL (ref 0.0–0.1)
EOS ABS: 0.1 10*3/uL (ref 0.0–0.5)
EOS%: 1.4 % (ref 0.0–7.0)
HEMATOCRIT: 39.6 % (ref 38.4–49.9)
HGB: 13.8 g/dL (ref 13.0–17.1)
LYMPH#: 2.6 10*3/uL (ref 0.9–3.3)
LYMPH%: 46.9 % (ref 14.0–49.0)
MCH: 34.4 pg — ABNORMAL HIGH (ref 27.2–33.4)
MCHC: 34.8 g/dL (ref 32.0–36.0)
MCV: 98.8 fL — ABNORMAL HIGH (ref 79.3–98.0)
MONO#: 0.7 10*3/uL (ref 0.1–0.9)
MONO%: 11.9 % (ref 0.0–14.0)
NEUT#: 2.2 10*3/uL (ref 1.5–6.5)
NEUT%: 39.6 % (ref 39.0–75.0)
PLATELETS: 112 10*3/uL — AB (ref 140–400)
RBC: 4.01 10*6/uL — ABNORMAL LOW (ref 4.20–5.82)
RDW: 15.9 % — ABNORMAL HIGH (ref 11.0–14.6)
WBC: 5.5 10*3/uL (ref 4.0–10.3)

## 2016-01-06 MED ORDER — PALONOSETRON HCL INJECTION 0.25 MG/5ML
INTRAVENOUS | Status: AC
  Filled 2016-01-06: qty 5

## 2016-01-06 MED ORDER — DEXTROSE 5 % IV SOLN
Freq: Once | INTRAVENOUS | Status: AC
  Administered 2016-01-06: 12:00:00 via INTRAVENOUS

## 2016-01-06 MED ORDER — POTASSIUM CHLORIDE CRYS ER 20 MEQ PO TBCR: 20.0000 meq | EXTENDED_RELEASE_TABLET | Freq: Two times a day (BID) | ORAL | Status: DC

## 2016-01-06 MED ORDER — OXALIPLATIN CHEMO INJECTION 100 MG/20ML
130.0000 mg/m2 | Freq: Once | INTRAVENOUS | Status: AC
  Administered 2016-01-06: 220 mg via INTRAVENOUS
  Filled 2016-01-06: qty 30

## 2016-01-06 MED ORDER — SODIUM CHLORIDE 0.9 % IV SOLN
10.0000 mg | Freq: Once | INTRAVENOUS | Status: AC
  Administered 2016-01-06: 10 mg via INTRAVENOUS
  Filled 2016-01-06: qty 1

## 2016-01-06 MED ORDER — PALONOSETRON HCL INJECTION 0.25 MG/5ML
0.2500 mg | Freq: Once | INTRAVENOUS | Status: AC
  Administered 2016-01-06: 0.25 mg via INTRAVENOUS

## 2016-01-06 NOTE — Patient Instructions (Signed)
[facility] Discharge Instructions for Patients Receiving Chemotherapy  Today you received the following chemotherapy agents Oxaliplatin  To help prevent nausea and vomiting after your treatment, we encourage you to take your nausea medication    If you develop nausea and vomiting that is not controlled by your nausea medication, call the clinic.   BELOW ARE SYMPTOMS THAT SHOULD BE REPORTED IMMEDIATELY:  *FEVER GREATER THAN 100.5 F  *CHILLS WITH OR WITHOUT FEVER  NAUSEA AND VOMITING THAT IS NOT CONTROLLED WITH YOUR NAUSEA MEDICATION  *UNUSUAL SHORTNESS OF BREATH  *UNUSUAL BRUISING OR BLEEDING  TENDERNESS IN MOUTH AND THROAT WITH OR WITHOUT PRESENCE OF ULCERS  *URINARY PROBLEMS  *BOWEL PROBLEMS  UNUSUAL RASH Items with * indicate a potential emergency and should be followed up as soon as possible.  Feel free to call the clinic you have any questions or concerns. The clinic phone number is [phone].  Please show the [place] at check-in to the Emergency Department and triage nurse.  Hypokalemia Hypokalemia means that the amount of potassium in the blood is lower than normal.Potassium is a chemical, called an electrolyte, that helps regulate the amount of fluid in the body. It also stimulates muscle contraction and helps nerves function properly.Most of the body's potassium is inside of cells, and only a very small amount is in the blood. Because the amount in the blood is so small, minor changes can be life-threatening. CAUSES  Antibiotics.  Diarrhea or vomiting.  Using laxatives too much, which can cause diarrhea.  Chronic kidney disease.  Water pills (diuretics).  Eating disorders (bulimia).  Low magnesium level.  Sweating a lot. SIGNS AND SYMPTOMS  Weakness.  Constipation.  Fatigue.  Muscle cramps.  Mental confusion.  Skipped heartbeats or irregular heartbeat (palpitations).  Tingling or numbness. DIAGNOSIS  Your  health care provider can diagnose hypokalemia with blood tests. In addition to checking your potassium level, your health care provider may also check other lab tests. TREATMENT Hypokalemia can be treated with potassium supplements taken by mouth or adjustments in your current medicines. If your potassium level is very low, you may need to get potassium through a vein (IV) and be monitored in the hospital. A diet high in potassium is also helpful. Foods high in potassium are:  Nuts, such as peanuts and pistachios.  Seeds, such as sunflower seeds and pumpkin seeds.  Peas, lentils, and lima beans.  Whole grain and bran cereals and breads.  Fresh fruit and vegetables, such as apricots, avocado, bananas, cantaloupe, kiwi, oranges, tomatoes, asparagus, and potatoes.  Orange and tomato juices.  Red meats.  Fruit yogurt. HOME CARE INSTRUCTIONS  Take all medicines as prescribed by your health care provider.  Maintain a healthy diet by including nutritious food, such as fruits, vegetables, nuts, whole grains, and lean meats.  If you are taking a laxative, be sure to follow the directions on the label. SEEK MEDICAL CARE IF:  Your weakness gets worse.  You feel your heart pounding or racing.  You are vomiting or having diarrhea.  You are diabetic and having trouble keeping your blood glucose in the normal range. SEEK IMMEDIATE MEDICAL CARE IF:  You have chest pain, shortness of breath, or dizziness.  You are vomiting or having diarrhea for more than 2 days.  You faint. MAKE SURE YOU:   Understand these instructions.  Will watch your condition.  Will get help right away if you are not doing well or get worse.   This  information is not intended to replace advice given to you by your health care provider. Make sure you discuss any questions you have with your health care provider.   Document Released: 06/07/2005 Document Revised: 06/28/2014 Document Reviewed:  12/08/2012 Elsevier Interactive Patient Education 2016 [place].   Potassium Content of Foods Potassium is a mineral found in many foods and drinks. It helps keep fluids and minerals balanced in your body and affects how steadily your heart beats. Potassium also helps control your blood pressure and keep your muscles and nervous system healthy. Certain health conditions and medicines may change the balance of potassium in your body. When this happens, you can help balance your level of potassium through the foods that you do or do not eat. Your health care provider or dietitian may recommend an amount of potassium that you should have each day. The following lists of foods provide the amount of potassium (in parentheses) per serving in each item. HIGH IN POTASSIUM  The following foods and beverages have 200 mg or more of potassium per serving:  Apricots, 2 raw or 5 dry (200 mg).  Artichoke, 1 medium (345 mg).  Avocado, raw,  each (245 mg).  Banana, 1 medium (425 mg).  Beans, lima, or baked beans, canned,  cup (280 mg).  Beans, white, canned,  cup (595 mg).  Beef roast, 3 oz (320 mg).  Beef, ground, 3 oz (270 mg).  Beets, raw or cooked,  cup (260 mg).  Bran muffin, 2 oz (300 mg).  Broccoli,  cup (230 mg).  Brussels sprouts,  cup (250 mg).  Cantaloupe,  cup (215 mg).  Cereal, 100% bran,  cup (200-400 mg).  Cheeseburger, single, fast food, 1 each (225-400 mg).  Chicken, 3 oz (220 mg).  Clams, canned, 3 oz (535 mg).  Crab, 3 oz (225 mg).  Dates, 5 each (270 mg).  Dried beans and peas,  cup (300-475 mg).  Figs, dried, 2 each (260 mg).  Fish: halibut, tuna, cod, snapper, 3 oz (480 mg).  Fish: salmon, haddock, swordfish, perch, 3 oz (300 mg).  Fish, tuna, canned 3 oz (200 mg).  [place] fries, fast food, 3 oz (470 mg).  Granola with fruit and nuts,  cup (200 mg).  Grapefruit juice,  cup (200 mg).  Greens, beet,  cup (655 mg).  Honeydew melon,   cup (200 mg).  Kale, raw, 1 cup (300 mg).  Kiwi, 1 medium (240 mg).  Kohlrabi, rutabaga, parsnips,  cup (280 mg).  Lentils,  cup (365 mg).  Mango, 1 each (325 mg).  Milk, chocolate, 1 cup (420 mg).  Milk: nonfat, low-fat, whole, buttermilk, 1 cup (350-380 mg).  Molasses, 1 Tbsp (295 mg).  Mushrooms,  cup (280) mg.  Nectarine, 1 each (275 mg).  Nuts: almonds, peanuts, hazelnuts, [place], cashew, mixed, 1 oz (200 mg).  Nuts, pistachios, 1 oz (295 mg).  Orange, 1 each (240 mg).  Orange juice,  cup (235 mg).  Papaya, medium,  fruit (390 mg).  Peanut butter, chunky, 2 Tbsp (240 mg).  Peanut butter, smooth, 2 Tbsp (210 mg).  Pear, 1 medium (200 mg).  Pomegranate, 1 whole (400 mg).  Pomegranate juice,  cup (215 mg).  Pork, 3 oz (350 mg).  Potato chips, salted, 1 oz (465 mg).  Potato, baked with skin, 1 medium (925 mg).  Potatoes, boiled,  cup (255 mg).  Potatoes, mashed,  cup (330 mg).  Prune juice,  cup (370 mg).  Prunes, 5 each (305 mg).  Pudding,  chocolate,  cup (230 mg).  Pumpkin, canned,  cup (250 mg).  Raisins, seedless,  cup (270 mg).  Seeds, sunflower or pumpkin, 1 oz (240 mg).  Soy milk, 1 cup (300 mg).  Spinach,  cup (420 mg).  Spinach, canned,  cup (370 mg).  Sweet potato, baked with skin, 1 medium (450 mg).  Swiss chard,  cup (480 mg).  Tomato or vegetable juice,  cup (275 mg).  Tomato sauce or puree,  cup (400-550 mg).  Tomato, raw, 1 medium (290 mg).  Tomatoes, canned,  cup (200-300 mg).  [place], 3 oz (250 mg).  Wheat germ, 1 oz (250 mg).  Winter squash,  cup (250 mg).  Yogurt, plain or fruited, 6 oz (260-435 mg).  Zucchini,  cup (220 mg). MODERATE IN POTASSIUM The following foods and beverages have 50-200 mg of potassium per serving:  Apple, 1 each (150 mg).  Apple juice,  cup (150 mg).  Applesauce,  cup (90 mg).  Apricot nectar,  cup (140 mg).  Asparagus, small spears,  cup or 6  spears (155 mg).  Bagel, cinnamon raisin, 1 each (130 mg).  Bagel, egg or plain, 4 in., 1 each (70 mg).  Beans, green,  cup (90 mg).  Beans, yellow,  cup (190 mg).  Beer, regular, 12 oz (100 mg).  Beets, canned,  cup (125 mg).  Blackberries,  cup (115 mg).  Blueberries,  cup (60 mg).  Bread, whole wheat, 1 slice (70 mg).  Broccoli, raw,  cup (145 mg).  Cabbage,  cup (150 mg).  Carrots, cooked or raw,  cup (180 mg).  Cauliflower, raw,  cup (150 mg).  Celery, raw,  cup (155 mg).  Cereal, bran flakes, cup (120-150 mg).  Cheese, cottage,  cup (110 mg).  Cherries, 10 each (150 mg).  Chocolate, 1 oz bar (165 mg).  Coffee, brewed 6 oz (90 mg).  Corn,  cup or 1 ear (195 mg).  Cucumbers,  cup (80 mg).  Egg, large, 1 each (60 mg).  Eggplant,  cup (60 mg).  Endive, raw, cup (80 mg).  English muffin, 1 each (65 mg).  Fish, orange roughy, 3 oz (150 mg).  Frankfurter, beef or pork, 1 each (75 mg).  Fruit cocktail,  cup (115 mg).  Grape juice,  cup (170 mg).  Grapefruit,  fruit (175 mg).  Grapes,  cup (155 mg).  Greens: kale, turnip, collard,  cup (110-150 mg).  Ice cream or frozen yogurt, chocolate,  cup (175 mg).  Ice cream or frozen yogurt, vanilla,  cup (120-150 mg).  Lemons, limes, 1 each (80 mg).  Lettuce, all types, 1 cup (100 mg).  Mixed vegetables,  cup (150 mg).  Mushrooms, raw,  cup (110 mg).  Nuts: walnuts, pecans, or macadamia, 1 oz (125 mg).  Oatmeal,  cup (80 mg).  Okra,  cup (110 mg).  Onions, raw,  cup (120 mg).  Peach, 1 each (185 mg).  Peaches, canned,  cup (120 mg).  Pears, canned,  cup (120 mg).  Peas, green, frozen,  cup (90 mg).  Peppers, green,  cup (130 mg).  Peppers, red,  cup (160 mg).  Pineapple juice,  cup (165 mg).  Pineapple, fresh or canned,  cup (100 mg).  Plums, 1 each (105 mg).  Pudding, vanilla,  cup (150 mg).  Raspberries,  cup (90 mg).  Rhubarb,   cup (115 mg).  Rice, wild,  cup (80 mg).  Shrimp, 3 oz (155 mg).  Spinach, raw, 1 cup (170 mg).  Strawberries,  cup (125 mg).  Summer squash  cup (175-200 mg).  Swiss chard, raw, 1 cup (135 mg).  Tangerines, 1 each (140 mg).  Tea, brewed, 6 oz (65 mg).  Turnips,  cup (140 mg).  Watermelon,  cup (85 mg).  Wine, red, table, 5 oz (180 mg).  Wine, white, table, 5 oz (100 mg). LOW IN POTASSIUM The following foods and beverages have less than 50 mg of potassium per serving.  Bread, white, 1 slice (30 mg).  Carbonated beverages, 12 oz (less than 5 mg).  Cheese, 1 oz (20-30 mg).  Cranberries,  cup (45 mg).  Cranberry juice cocktail,  cup (20 mg).  Fats and oils, 1 Tbsp (less than 5 mg).  Hummus, 1 Tbsp (32 mg).  Nectar: papaya, mango, or pear,  cup (35 mg).  Rice, white or brown,  cup (50 mg).  Spaghetti or macaroni,  cup cooked (30 mg).  Tortilla, flour or corn, 1 each (50 mg).  Waffle, 4 in., 1 each (50 mg).  Water chestnuts,  cup (40 mg).   This information is not intended to replace advice given to you by your health care provider. Make sure you discuss any questions you have with your health care provider.   Document Released: 01/19/2005 Document Revised: 06/12/2013 Document Reviewed: 05/04/2013 Elsevier Interactive Patient Education [employer].

## 2016-01-06 NOTE — Telephone Encounter (Signed)
Gave patient avs report and appointments for October. Radiology scheduling will call patient re ct - patient aware.  °

## 2016-01-06 NOTE — Progress Notes (Signed)
Pt informed there is po potassium for him to pick up at pharmacy per [facility] RN per MD Burr Medico.

## 2016-01-06 NOTE — Progress Notes (Signed)
[name]  [phone] [phone]  Clinic follow Up Note   Patient Care Team: No Pcp Per Patient as PCP - General (General Practice) 01/06/2016   CHIEF COMPLAINTS:  Follow up stage III right colon cancer  Oncology History   Cancer of right colon [facility])   Staging form: Colon and Rectum, AJCC 7th Edition     Pathologic stage from 06/12/2015: Stage IIIC (T3, N2b, cM0) - Signed by Truitt Merle, MD on 07/07/2015       Cancer of right colon ([place])   06/09/2015 Imaging CT chest abdomen and pelvis showed proximal and a transverse colitis, fluid-filled and dilated transverse colon indicating of obstruction related to a mass at the junction of the transverse and descending colon. trace left pleural effusion.    06/10/2015 Initial Diagnosis Cancer of right colon ([place])   06/10/2015 Procedure Colonoscopy showed a friable ulcerated mucosa in distal to mid transverse with stricture, scope couldn't be advanced beyond the. 11 sessile polyps removed.   06/12/2015 Surgery Right hemicolectomy and lymph node dissection.   06/12/2015 Pathology Results Moderately differentiated colonic adenocarcinoma, 3 cm, G2, located in the transverse colon, LVI(-), perineural invasion (-), T3, margins were negative, 7 out of 30 lymph nodes were positive, polyps were tubular adenoma.   07/31/2015 -  Chemotherapy adjuvant chemo CAPEOX, oxaliplatin 1 30 mg/m, Xeloda 1000 mg/m on day 1-14, every 21 days    HISTORY OF PRESENTING ILLNESS:  John Moon 45 y.o. male is here because of John Moon recently diagnosed stage III colon cancer. He is accompanied by John Moon wife, half-brother, son, and mother to the clinic today.   He presented to [facility] emergency room with sudden onset abdominal pain and nausea for 2 days on 06/09/2015. CT scan reviewed follow-up suction and I palpable mass at the junction of the transverse and descending colon. He was admitted to the hospital, underwent colonoscopy on  06/10/2015, which showed a friable ulcerated mass in distal to mid transverse colon with structure, scope was not able to advance beyond that. The biopsy of the colon mass showed adenocarcinoma. He also had at least 11 since I polyps removed it he underwent right hemicolectomy and lymph node dissection by Dr. Marlou Starks on 06/12/2015. He tolerated the surgery very well, was discharged home on 06/17/2015.   He has recovered well from John Moon surgery, denies any pain, nausea, change of John Moon bowel habits. He has good appetite and energy level. No other complaints. 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   CURRENT THERAPY:  adjuvant chemo Xeloda '1650mg'$  bid on day 1-14, and oxaliplatin '130mg'$ /m2 on day 1, every 21 days, started on 07/31/2015  INTERIM HISTORY: Favor returns for follow up and last cycle chemotherapy. He is doing well overall, has mild fatigue from chemotherapy, no significant other side effects. John Moon appetite is moderate, eats well, John Moon weight is stable. He denies any fever or chills lately. No significant neuropathy.  MEDICAL HISTORY:  Past Medical History  Diagnosis Date  . Colon cancer [facility])     SURGICAL HISTORY: Past Surgical History  Procedure Laterality Date  . Colonoscopy with propofol N/A 06/10/2015    Procedure: COLONOSCOPY WITH PROPOFOL;  Surgeon: Mauri Pole, MD;  Location: [facility] ENDOSCOPY;  Service: Endoscopy;  Laterality: N/A;  . Laparoscopic partial colectomy N/A 06/12/2015    Procedure: LAPAROSCOPIC ASSISTED  PARTIAL COLECTOMY;  Surgeon: Autumn Messing III, MD;  Location: [facility];  Service: General;  Laterality: N/A;  . Partial colectomy  06/12/2015    Procedure: PARTIAL COLECTOMY;  Surgeon: Autumn Messing III, MD;  Location: [facility] ORS;  Service: General;;    SOCIAL HISTORY: Social History   Social History  . Marital Status: Married     Spouse Name: N/A  . Number of Children: 2  . Years of Education: N/A    Occupational History  . Not on file.   Social History Main Topics  . Smoking status: Current Every Day Smoker -- 1.00 packs/day    Types: Cigarettes  . Smokeless tobacco: Never Used  . Alcohol Use: 4.2 oz/week    2 Standard drinks or equivalent, 5 Cans of beer per week  . Drug Use: No  . Sexual Activity: No   Other Topics Concern  . 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 No pertinent family history. no family history of colon cancer or other malignancy.   ALLERGIES:  has No Known Allergies.  MEDICATIONS:  Current Outpatient Prescriptions  Medication Sig Dispense Refill  . capecitabine (XELODA) 150 MG tablet Take 1 tablet (150 mg total) by mouth 2 (two) times daily after a meal. Take on days 1-14 of chemotherapy. 28 tablet 1  . capecitabine (XELODA) 500 MG tablet Take 3 tablets (1,500 mg total) by mouth 2 (two) times daily after a meal. Take on days 1-14 of chemotherapy. 84 tablet 1  . ondansetron (ZOFRAN) 8 MG tablet Take 1 tablet (8 mg total) by mouth 2 (two) times daily as needed for refractory nausea / vomiting. Start on day 3 after chemotherapy. (Patient not taking: Reported on 08/11/2015) 30 tablet 1  . oxyCODONE-acetaminophen (ROXICET) 5-325 MG tablet Take 1-2 tablets by mouth every 4 (four) hours as needed for severe pain. (Patient not taking: Reported on 08/11/2015) 30 tablet 0  . prochlorperazine (COMPAZINE) 10 MG tablet Take 1 tablet (10 mg total) by mouth every 6 (six) hours as needed (Nausea or vomiting). (Patient not taking: Reported on 08/11/2015) 30 tablet 1   No current facility-administered medications for this visit.    REVIEW OF SYSTEMS:   Constitutional: Denies fevers, chills or abnormal night sweats Eyes: Denies blurriness of  vision, double vision or watery eyes Ears, nose, mouth, throat, and face: Denies mucositis or sore throat Respiratory: Denies cough, dyspnea or wheezes Cardiovascular: Denies palpitation, chest discomfort or lower extremity swelling Gastrointestinal:  Denies nausea, heartburn or change in bowel habits Skin: Denies abnormal skin rashes Lymphatics: Denies new lymphadenopathy or easy bruising Neurological:Denies numbness, tingling or new weaknesses Behavioral/Psych: Mood is stable, no new changes  All other systems were reviewed with the patient and are negative.  PHYSICAL EXAMINATION: ECOG PERFORMANCE STATUS: 0 - Asymptomatic BP 118/84 mmHg  Pulse 70  Temp(Src) 98.2 F (36.8 C) (Oral)  Resp 17  Ht '5\' 8"'$  (1.727 m)  Wt 140 lb 3.2 oz (63.594 kg)  BMI 21.32 kg/m2  SpO2 100% GENERAL:alert, no distress and comfortable SKIN: skin color, texture, turgor are normal, no rashes or significant lesions EYES: normal, conjunctiva are pink and non-injected, sclera clear OROPHARYNX:no exudate, no erythema and lips, buccal mucosa, and tongue normal  NECK: supple, thyroid normal size, non-tender, without nodularity LYMPH:  no palpable lymphadenopathy  in the cervical, axillary or inguinal LUNGS: clear to auscultation and percussion with normal breathing effort HEART: regular rate & rhythm and no murmurs and no lower extremity edema ABDOMEN:abdomen soft, non-tender and normal bowel sounds, surgical incision sites are well healed. Musculoskeletal:no cyanosis of digits and no clubbing  PSYCH: alert & oriented x 3 with fluent speech NEURO: no focal motor/sensory deficits  LABORATORY DATA:  I have reviewed the data as listed CBC Latest Ref Rng 01/06/2016 12/16/2015 11/25/2015  WBC 4.0 - 10.3 10e3/uL 5.5 10.3 7.7  Hemoglobin 13.0 - 17.1 g/dL 13.8 14.1 14.4  Hematocrit 38.4 - 49.9 % 39.6 41.6 41.6  Platelets 140 - 400 10e3/uL 112(L) 148 131(L)   CMP Latest Ref Rng 01/06/2016 12/16/2015 11/25/2015  Glucose 70  - 140 mg/dl 108 136 94  BUN 7.0 - 26.0 mg/dL 4.7(L) 4.2(L) 5.4(L)  Creatinine 0.7 - 1.3 mg/dL 0.8 0.8 0.8  Sodium 136 - 145 mEq/L 139 139 141  Potassium 3.5 - 5.1 mEq/L 3.3(L) 3.6 3.7  CO2 22 - 29 mEq/L _0 Calcium 8.4 - 10.4 mg/dL 9.0 9.3 9.2  Total Protein 6.4 - 8.3 g/dL 7.4 7.5 7.4  Total Bilirubin 0.20 - 1.20 mg/dL 0.52 0.31 0.36  Alkaline Phos 40 - 150 U/L 99 99 89  AST 5 - 34 U/L _1 ALT 0 - 55 U/L 26 34 28     Pathology report Diagnosis 06/12/2015 Colon, segmental resection for tumor, transverse and right colon MODERATELY DIFFERENTIATED COLONIC ADENOCARCINOMA (3 CM) THE TUMOR INVADE THROUGH THE MUSCULARIS PROPRIA INTO PERICOLONIC TISSUE (T3) ALL MARGINS OF RESECTION ARE NEGATIVE FOR TUMOR METASTATIC COLONIC ADENOCARCINOMA IN SEVEN OF THIRTY LYMPH NODES (7/30, N2B) TUBULAR ADENOMA (X1) TUBULAR ADENOMA INVOLVING APPENDIX Microscopic Comment COLON AND RECTUM (INCLUDING TRANS-ANAL RESECTION): Specimen: Right and transverse colon Procedure: segmental resection Tumor site: Transverse colon Specimen integrity: Intact Macroscopic intactness of mesorectum: Not applicable: _ Macroscopic tumor perforation: Invading through the muscularis propria into pericolonic soft tissue Invasive tumor: Maximum size: 3 cm Histologic type(s): Adenocarcinoma Histologic grade and differentiation: G2 G1: well differentiated/low grade G2: moderately differentiated/low grade G3: poorly differentiated/high grade G4: undifferentiated/high grade Type of polyp in which invasive carcinoma arose: Tubular adenoma Microscopic extension of invasive tumor: Invading through the muscularis propria into pericolonic soft tissue Lymph-Vascular invasion: Negative Peri-neural invasion: Negative 1 of 3 FINAL for Chunn, Trent V ([id number]) Microscopic Comment(continued) Tumor deposit(s) (discontinuous extramural extension): Negative Resection margins: Proximal margin: negative Distal margin:  negative Circumferential (radial) (posterior ascending, posterior descending; lateral and posterior mid-rectum; and entire lower 1/3 rectum): negative Mesenteric margin (sigmoid and transverse): negative Distance closest margin (if all above margins negative): 3.5 cm Treatment effect (neo-adjuvant therapy): Negative Additional polyp(s): Tubular adenoma Non-neoplastic findings: Unremarkable Lymph nodes: number examined 30; number positive: 7 Pathologic Staging: T3, N2b, M_ Ancillary studies: Microsatelite instability PCR ordered Casimer Lanius MD Pathologist, Electronic Signature (Case signed 06/17/2015)    pathology  RADIOGRAPHIC STUDIES: I have personally reviewed the radiological images as listed and agreed with the findings in the report.    ENDOSCOPIC IMPRESSION: 06/10/2015 Friable ulcerated mucosa in distal to mid transverse with stricture, scope couldn't be advanced beyond it, ultiple biopsies taken from the edge of the mass. 11 sessile polyps removed   ASSESSMENT & PLAN:  45 year-old [place] male, without significant past medical history, presented with biopsy action.  Colonoscopy showed a ulcerative mass in the transverse colon, and multiple polyps.   1. Right colon adenocarcinoma, pT3N2bM0, stage IIIB, MSI-stable  -  I reviewed John Moon CT scan and surgical path in great details, including the staging, and molecular features.  -I reviewed the nature history of colon cancer, and risk of cancer recurrence after surgery. Due to John Moon locally advanced disease, especially multiple (7) positive nodes, John Moon risk of recurrence is very high.  -He is adjuvant chemo with CAPEOX -he has been tolerating adjuvant chemotherapy well, lab  results reviewed with patient, CBC and CMP normal, we'll continue chemo, cycle 8 today, he has started Xeloda this morning. -We discussed the cost cancer surveillance after he completes adjuvant chemotherapy. He will follow up with me every 3-4 months with lab  and exam for the next 2-3 years, then every 6 months for total 5 years. I'll repeat a CT scan in 3 months, then every 6-12 months afterwards.  2. Mild peripheral neuropathy -He had transit mild peripheral neuropathy after chemotherapy, resolved now. -We'll continue monitoring.  3. Thrombocytopenia, secondary to chemotherapy -John Moon platelet count 112K today, we'll continue monitoring.  Plan -Lab results reviewed with patient, mild thrombocytopenia, adequate for treatment, we'll proceed cycle 8 CAPEOX  Today.  -I'll see him back in 3 months with a surveillance CT chest, abdomen and pelvis with contrast   All questions were answered. The patient knows to call the clinic with any problems, questions or concerns.   I spent 15 minutes counseling the patient face to face. The total time spent in the appointment was 20 minutes and more than 50% was on counseling.     Truitt Merle, MD 01/06/2016

## 2016-01-09 ENCOUNTER — Encounter: Payer: Self-pay | Admitting: Hematology

## 2016-01-09 NOTE — Progress Notes (Signed)
dr Burr Medico recd and filled out/signed. faxed [phone] and mailed copy to patient and sent to medical recrds

## 2016-02-19 ENCOUNTER — Encounter: Payer: Self-pay | Admitting: Hematology

## 2016-02-19 NOTE — Progress Notes (Signed)
Talk to patient and he wants to stay out of work till after his next appt 04/06/16. He gave ph# for employer and ok to speak with pam or suzanna to let them know,. See prev notes I told him form was faxed, dr had 02/18/16. I will confirm with dr. Burr Medico and resend with new date  [phone] if ph for employer

## 2016-02-20 ENCOUNTER — Encounter: Payer: Self-pay | Admitting: Hematology

## 2016-02-20 NOTE — Progress Notes (Signed)
Called and left mess for pam at [phone] to call back with fax# to send form. Dr Burr Medico said ok to be out till after next appt 04/09/16 is what I revised the form to read.

## 2016-03-08 ENCOUNTER — Telehealth: Payer: Self-pay | Admitting: *Deleted

## 2016-03-08 NOTE — Telephone Encounter (Signed)
Received call from pt requesting a call back from nurse.   Spoke with pt and was informed that pt still needs short term disability forms to be faxed to his employer.   Called Pam  At    [employer] , and was informed that she would resend disability forms to Dr. Ernestina Penna office to update return to work date.  Merrily Pew direct fax to nurses' desk .

## 2016-03-10 ENCOUNTER — Encounter: Payer: Self-pay | Admitting: *Deleted

## 2016-03-10 NOTE — Progress Notes (Signed)
Checking to f/u on status of CT scan needed before 10/17 visit. Not yet scheduled. Message to managed care to complete precert.

## 2016-03-15 ENCOUNTER — Telehealth: Payer: Self-pay | Admitting: Hematology

## 2016-03-15 NOTE — Telephone Encounter (Signed)
Per LOS was unable to reach pt through language line to inform pt of lab appt 9/28. Interpreter stated that both numbers were invalid. Letter sent by mail 9/25

## 2016-03-18 ENCOUNTER — Other Ambulatory Visit: Payer: Managed Care, Other (non HMO)

## 2016-03-30 ENCOUNTER — Other Ambulatory Visit: Payer: Managed Care, Other (non HMO)

## 2016-04-06 ENCOUNTER — Ambulatory Visit (HOSPITAL_BASED_OUTPATIENT_CLINIC_OR_DEPARTMENT_OTHER): Payer: Managed Care, Other (non HMO)

## 2016-04-06 ENCOUNTER — Telehealth: Payer: Self-pay | Admitting: Hematology

## 2016-04-06 ENCOUNTER — Encounter: Payer: Self-pay | Admitting: Hematology

## 2016-04-06 ENCOUNTER — Ambulatory Visit (HOSPITAL_BASED_OUTPATIENT_CLINIC_OR_DEPARTMENT_OTHER): Payer: Managed Care, Other (non HMO) | Admitting: Hematology

## 2016-04-06 VITALS — BP 114/75 | HR 65 | Temp 97.9°F | Resp 18 | Ht 68.0 in | Wt 139.6 lb

## 2016-04-06 DIAGNOSIS — C182 Malignant neoplasm of ascending colon: Secondary | ICD-10-CM | POA: Diagnosis not present

## 2016-04-06 DIAGNOSIS — G620 Drug-induced polyneuropathy: Secondary | ICD-10-CM

## 2016-04-06 DIAGNOSIS — R5383 Other fatigue: Secondary | ICD-10-CM | POA: Diagnosis not present

## 2016-04-06 LAB — COMPREHENSIVE METABOLIC PANEL
ALBUMIN: 4.1 g/dL (ref 3.5–5.0)
ALK PHOS: 99 U/L (ref 40–150)
ALT: 38 U/L (ref 0–55)
AST: 21 U/L (ref 5–34)
Anion Gap: 11 mEq/L (ref 3–11)
BUN: 8.5 mg/dL (ref 7.0–26.0)
CO2: 21 mEq/L — ABNORMAL LOW (ref 22–29)
Calcium: 9.6 mg/dL (ref 8.4–10.4)
Chloride: 107 mEq/L (ref 98–109)
Creatinine: 0.8 mg/dL (ref 0.7–1.3)
EGFR: >90 mL/min/{1.73_m2} (ref >90)
GLUCOSE: 98 mg/dL (ref 70–140)
POTASSIUM: 4.1 meq/L (ref 3.5–5.1)
SODIUM: 139 meq/L (ref 136–145)
Total Bilirubin: 0.22 mg/dL (ref 0.20–1.20)
Total Protein: 8.3 g/dL (ref 6.4–8.3)

## 2016-04-06 LAB — CBC WITH DIFFERENTIAL/PLATELET
BASO%: 0.4 % (ref 0.0–2.0)
BASOS ABS: 0 10*3/uL (ref 0.0–0.1)
EOS%: 3.2 % (ref 0.0–7.0)
Eosinophils Absolute: 0.3 10*3/uL (ref 0.0–0.5)
HCT: 43.2 % (ref 38.4–49.9)
HEMOGLOBIN: 15.3 g/dL (ref 13.0–17.1)
LYMPH%: 43.8 % (ref 14.0–49.0)
MCH: 33.2 pg (ref 27.2–33.4)
MCHC: 35.4 g/dL (ref 32.0–36.0)
MCV: 93.7 fL (ref 79.3–98.0)
MONO#: 0.7 10*3/uL (ref 0.1–0.9)
MONO%: 6.6 % (ref 0.0–14.0)
NEUT%: 46 % (ref 39.0–75.0)
NEUTROS ABS: 4.6 10*3/uL (ref 1.5–6.5)
Platelets: 189 10*3/uL (ref 140–400)
RBC: 4.61 10*6/uL (ref 4.20–5.82)
RDW: 12.4 % (ref 11.0–14.6)
WBC: 10 10*3/uL (ref 4.0–10.3)
lymph#: 4.4 10*3/uL — ABNORMAL HIGH (ref 0.9–3.3)

## 2016-04-06 LAB — CEA (IN HOUSE-CHCC): CEA (CHCC-In House): 9.04 ng/mL — ABNORMAL HIGH (ref 0.00–5.00)

## 2016-04-06 NOTE — Progress Notes (Addendum)
[facility]  [phone] [phone]  Clinic follow Up Note   Patient Care Team: No Pcp Per Patient as PCP - General (General Practice) 04/06/2016   CHIEF COMPLAINTS:  Follow up stage III right colon cancer  Oncology History   Cancer of right colon [facility])   Staging form: Colon and Rectum, AJCC 7th Edition     Pathologic stage from 06/12/2015: Stage IIIC (T3, N2b, cM0) - Signed by Malachy Mood, MD on 07/07/2015       Cancer of right colon (HCC)   06/09/2015 Imaging    CT chest abdomen and pelvis showed proximal and a transverse colitis, fluid-filled and dilated transverse colon indicating of obstruction related to a mass at the junction of the transverse and descending colon. trace left pleural effusion.       06/10/2015 Initial Diagnosis    Cancer of right colon (HCC)      06/10/2015 Procedure    Colonoscopy showed a friable ulcerated mucosa in distal to mid transverse with stricture, scope couldn't be advanced beyond the. 11 sessile polyps removed.      06/12/2015 Surgery    Right hemicolectomy and lymph node dissection.      06/12/2015 Pathology Results    Moderately differentiated colonic adenocarcinoma, 3 cm, G2, located in the transverse colon, LVI(-), perineural invasion (-), T3, margins were negative, 7 out of 30 lymph nodes were positive, polyps were tubular adenoma.      07/31/2015 - 01/06/2016 Chemotherapy    adjuvant chemo CAPEOX, oxaliplatin 130 mg/m, Xeloda 1000 mg/m on day 1-14, every 21 days, S/P 8 cycles        HISTORY OF PRESENTING ILLNESS:  John Moon 45 y.o. male is here because of His recently diagnosed stage III colon cancer. He is accompanied by his wife, half-brother, son, and mother to the clinic today.   He presented to [facility] emergency room with sudden onset abdominal pain and nausea for 2 days on 06/09/2015. CT scan reviewed follow-up suction and I palpable mass at the junction of the transverse and  descending colon. He was admitted to the hospital, underwent colonoscopy on 06/10/2015, which showed a friable ulcerated mass in distal to mid transverse colon with structure, scope was not able to advance beyond that. The biopsy of the colon mass showed adenocarcinoma. He also had at least 11 since I polyps removed it he underwent right hemicolectomy and lymph node dissection by Dr. Carolynne Edouard on 06/12/2015. He tolerated the surgery very well, was discharged home on 06/17/2015.   He has recovered well from his surgery, denies any pain, nausea, change of his bowel habits. He has good appetite and energy level. No other complaints. 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   CURRENT THERAPY:  Cancer surveillance  INTERIM HISTORY: Rody returns for follow up. He completed chemotherapy about 3 months ago, he still has mild residual fatigue and numbness on his fingers and toes, no difficulty with his hand functions. He has not returned to work, due to the concern of weakness and not able to lift heavy staff. He denies any other new symptoms he is accompanied by his daughter and son to the clinic today.  MEDICAL HISTORY:  Past Medical History:  Diagnosis Date  . Colon cancer [facility])     SURGICAL HISTORY: Past Surgical History:  Procedure Laterality Date  . COLONOSCOPY WITH PROPOFOL N/A 06/10/2015   Procedure:  COLONOSCOPY WITH PROPOFOL;  Surgeon: Mauri Pole, MD;  Location: [facility] ENDOSCOPY;  Service: Endoscopy;  Laterality: N/A;  . LAPAROSCOPIC PARTIAL COLECTOMY N/A 06/12/2015   Procedure: LAPAROSCOPIC ASSISTED  PARTIAL COLECTOMY;  Surgeon: Autumn Messing III, MD;  Location: [facility] ORS;  Service: General;  Laterality: N/A;  . PARTIAL COLECTOMY  06/12/2015   Procedure: PARTIAL COLECTOMY;  Surgeon: Autumn Messing III, MD;  Location: [facility] ORS;  Service: General;;    SOCIAL HISTORY: Social History   Social History  . Marital Status: Married     Spouse  Name: N/A  . Number of Children: 2  . Years of Education: N/A   Occupational History  . Not on file.   Social History Main Topics  . Smoking status: Current Every Day Smoker -- 1.00 packs/day    Types: Cigarettes  . Smokeless tobacco: Never Used  . Alcohol Use: 4.2 oz/week    2 Standard drinks or equivalent, 5 Cans of beer per week  . Drug Use: No  . Sexual Activity: No   Other Topics Concern  . 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 No pertinent family history. no family history of colon cancer or other malignancy.   ALLERGIES:  has No Known Allergies.  MEDICATIONS:  Current Outpatient Prescriptions  Medication Sig Dispense Refill  . capecitabine (XELODA) 150 MG tablet Take 1 tablet (150 mg total) by mouth 2 (two) times daily after a meal. Take on days 1-14 of chemotherapy. 28 tablet 1  . capecitabine (XELODA) 500 MG tablet Take 3 tablets (1,500 mg total) by mouth 2 (two) times daily after a meal. Take on days 1-14 of chemotherapy. 84 tablet 1  . ondansetron (ZOFRAN) 8 MG tablet Take 1 tablet (8 mg total) by mouth 2 (two) times daily as needed for refractory nausea / vomiting. Start on day 3 after chemotherapy. (Patient not taking: Reported on 08/11/2015) 30 tablet 1  . oxyCODONE-acetaminophen (ROXICET) 5-325 MG tablet Take 1-2 tablets by mouth every 4 (four) hours as needed for severe pain. (Patient not taking: Reported on 08/11/2015) 30 tablet 0  . potassium chloride SA (K-DUR,KLOR-CON) 20 MEQ tablet Take 1 tablet (20 mEq total) by mouth 2 (two) times daily. 20 tablet 0  . prochlorperazine (COMPAZINE) 10 MG tablet Take 1 tablet (10 mg total) by mouth every 6 (six) hours as needed (Nausea or vomiting). (Patient not taking: Reported on 08/11/2015) 30  tablet 1   No current facility-administered medications for this visit.     REVIEW OF SYSTEMS:   Constitutional: Denies fevers, chills or abnormal night sweats Eyes: Denies blurriness of vision, double vision or watery eyes Ears, nose, mouth, throat, and face: Denies mucositis or sore throat Respiratory: Denies cough, dyspnea or wheezes Cardiovascular: Denies palpitation, chest discomfort or lower extremity swelling Gastrointestinal:  Denies nausea, heartburn or change in bowel habits Skin: Denies abnormal skin rashes Lymphatics: Denies new lymphadenopathy or easy bruising Neurological:Denies numbness, tingling or new weaknesses Behavioral/Psych: Mood is stable, no new changes  All other systems were reviewed with the patient and are negative.  PHYSICAL EXAMINATION: ECOG PERFORMANCE STATUS: 1 BP 114/75 (BP Location: Left Arm, Patient Position: Sitting)   Pulse 65   Temp 97.9 F (36.6 C) (Oral)  Resp 18   Ht '5\' 8"'$  (1.727 m)   Wt 139 lb 9.6 oz (63.3 kg)   SpO2 100%   BMI 21.23 kg/m  GENERAL:alert, no distress and comfortable SKIN: skin color, texture, turgor are normal, no rashes or significant lesions EYES: normal, conjunctiva are pink and non-injected, sclera clear OROPHARYNX:no exudate, no erythema and lips, buccal mucosa, and tongue normal  NECK: supple, thyroid normal size, non-tender, without nodularity LYMPH:  no palpable lymphadenopathy in the cervical, axillary or inguinal LUNGS: clear to auscultation and percussion with normal breathing effort HEART: regular rate & rhythm and no murmurs and no lower extremity edema ABDOMEN:abdomen soft, non-tender and normal bowel sounds, surgical incision sites are well healed. Musculoskeletal:no cyanosis of digits and no clubbing  PSYCH: alert & oriented x 3 with fluent speech NEURO: no focal motor/sensory deficits  LABORATORY DATA:  I have reviewed the data as listed CBC Latest Ref Rng & Units 04/06/2016 01/06/2016 12/16/2015    WBC 4.0 - 10.3 10e3/uL 10.0 5.5 10.3  Hemoglobin 13.0 - 17.1 g/dL 15.3 13.8 14.1  Hematocrit 38.4 - 49.9 % 43.2 39.6 41.6  Platelets 140 - 400 10e3/uL 189 112(L) 148   CMP Latest Ref Rng & Units 01/06/2016 12/16/2015 11/25/2015  Glucose 70 - 140 mg/dl 108 136 94  BUN 7.0 - 26.0 mg/dL 4.7(L) 4.2(L) 5.4(L)  Creatinine 0.7 - 1.3 mg/dL 0.8 0.8 0.8  Sodium 136 - 145 mEq/L 139 139 141  Potassium 3.5 - 5.1 mEq/L 3.3(L) 3.6 3.7  Chloride 101 - 111 mmol/L - - -  CO2 22 - 29 mEq/L '23 23 23  '$ Calcium 8.4 - 10.4 mg/dL 9.0 9.3 9.2  Total Protein 6.4 - 8.3 g/dL 7.4 7.5 7.4  Total Bilirubin 0.20 - 1.20 mg/dL 0.52 0.31 0.36  Alkaline Phos 40 - 150 U/L 99 99 89  AST 5 - 34 U/L '22 26 19  '$ ALT 0 - 55 U/L 26 34 28   CEA: 06/11/2015: 26.3 08/11/2015: 5.6 10/29/2015: 5.8  Pathology report Diagnosis 06/12/2015 Colon, segmental resection for tumor, transverse and right colon MODERATELY DIFFERENTIATED COLONIC ADENOCARCINOMA (3 CM) THE TUMOR INVADE THROUGH THE MUSCULARIS PROPRIA INTO PERICOLONIC TISSUE (T3) ALL MARGINS OF RESECTION ARE NEGATIVE FOR TUMOR METASTATIC COLONIC ADENOCARCINOMA IN SEVEN OF THIRTY LYMPH NODES (7/30, N2B) TUBULAR ADENOMA (X1) TUBULAR ADENOMA INVOLVING APPENDIX Microscopic Comment COLON AND RECTUM (INCLUDING TRANS-ANAL RESECTION): Specimen: Right and transverse colon Procedure: segmental resection Tumor site: Transverse colon Specimen integrity: Intact Macroscopic intactness of mesorectum: Not applicable: _ Macroscopic tumor perforation: Invading through the muscularis propria into pericolonic soft tissue Invasive tumor: Maximum size: 3 cm Histologic type(s): Adenocarcinoma Histologic grade and differentiation: G2 G1: well differentiated/low grade G2: moderately differentiated/low grade G3: poorly differentiated/high grade G4: undifferentiated/high grade Type of polyp in which invasive carcinoma arose: Tubular adenoma Microscopic extension of invasive tumor: Invading  through the muscularis propria into pericolonic soft tissue Lymph-Vascular invasion: Negative Peri-neural invasion: Negative 1 of 3 FINAL for Soper, Bhavin V ([id number]) Microscopic Comment(continued) Tumor deposit(s) (discontinuous extramural extension): Negative Resection margins: Proximal margin: negative Distal margin: negative Circumferential (radial) (posterior ascending, posterior descending; lateral and posterior mid-rectum; and entire lower 1/3 rectum): negative Mesenteric margin (sigmoid and transverse): negative Distance closest margin (if all above margins negative): 3.5 cm Treatment effect (neo-adjuvant therapy): Negative Additional polyp(s): Tubular adenoma Non-neoplastic findings: Unremarkable Lymph nodes: number examined 30; number positive: 7 Pathologic Staging: T3, N2b, M_ Ancillary studies: Microsatelite instability PCR ordered Casimer Lanius MD Pathologist, Electronic Signature (Case signed 06/17/2015)  pathology  RADIOGRAPHIC STUDIES: I have personally reviewed the radiological images as listed and agreed with the findings in the report.    ENDOSCOPIC IMPRESSION: 06/10/2015 Friable ulcerated mucosa in distal to mid transverse with stricture, scope couldn't be advanced beyond it, ultiple biopsies taken from the edge of the mass. 11 sessile polyps removed   ASSESSMENT & PLAN:  45 year-old [place] male, without significant past medical history, presented with biopsy action.  Colonoscopy showed a ulcerative mass in the transverse colon, and multiple polyps.   1. Right colon adenocarcinoma, pT3N2bM0, stage IIIB, MSI-stable  -I reviewed his CT scan and surgical path in great details, including the staging, and molecular features.  -I reviewed the nature history of colon cancer, and risk of cancer recurrence after surgery. Due to his locally advanced disease, especially multiple (7) positive nodes, his risk of recurrence is very high.  -He has completed  adjuvant chemo with CAPEOX for 6 months, and tolerated well. -He is clinically doing well, asymptomatic except mild fatigue and neuropathy from chemotherapy, exam was unremarkable, lab results are still pending, no significant clinical concern for recurrence. -We discussed the cost cancer surveillance after he completes adjuvant chemotherapy. He will follow up with me every 3-4 months with lab and exam for the next 2-3 years, then every 6 months for total 5 years. I'll repeat a CT scan every 12 months (insurance denied more frequent scan ) -Next restaging scan in March 2018 if clinically doing well   2. 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  3. Fatigue -I strongly encouraged him to be more physically active, and consider exercise regularly, to build up his strength   Plan -Lab today -Lab and follow-up in 3 months -I wrote a letter for his work   All questions were answered. The patient knows to call the clinic with any problems, questions or concerns.   I spent 25 minutes counseling the patient face to face. The total time spent in the appointment was 30 minutes and more than 50% was on counseling.     Truitt Merle, MD 04/06/2016   Addendum His repeated CEA was elevated, higher than before, I recommend to get a restaging CT within next 2-3 weeks to rule out cancer recurrence.   Results for KITO, CUFFE (MRN [id number]) as of 05/03/2016 10:01  Ref. Range 08/11/2015 09:25 10/29/2015 09:52 04/06/2016 11:18  CEA Latest Ref Range: 0.0 - 4.7 ng/mL 5.6 (H) 5.8 (H) 8.3 (H)  CEA ([facility]) Latest Ref Range: 0.00 - 5.00 ng/mL   9.04 Lemmie Evens)    Truitt Merle  04/14/2016

## 2016-04-06 NOTE — Telephone Encounter (Signed)
Lab appointment added for today, per 04/06/16 los. Lab and follow up appointment scheduled for February, as per 04/06/16 los. Avs report and appointment schedule was given to patient, per 04/06/16 los.

## 2016-04-07 LAB — CEA: CEA1: 8.3 ng/mL — AB (ref 0.0–4.7)

## 2016-04-12 ENCOUNTER — Other Ambulatory Visit: Payer: Self-pay | Admitting: Hematology

## 2016-04-12 DIAGNOSIS — C182 Malignant neoplasm of ascending colon: Secondary | ICD-10-CM

## 2016-04-15 NOTE — Progress Notes (Signed)
Attempted to call pt & emergency contact-son & no answer.

## 2016-04-19 ENCOUNTER — Telehealth: Payer: Self-pay | Admitting: Hematology

## 2016-04-19 NOTE — Telephone Encounter (Signed)
Unable to reach thru language line with interpreter [phone] pt and son John Moon to inform of 11/13 appt . Letter mailed 10/30. Numbers listed are not working numbers

## 2016-04-28 ENCOUNTER — Encounter: Payer: Self-pay | Admitting: Gastroenterology

## 2016-05-03 ENCOUNTER — Ambulatory Visit: Payer: Managed Care, Other (non HMO) | Admitting: Hematology

## 2016-07-26 ENCOUNTER — Telehealth: Payer: Self-pay | Admitting: Hematology

## 2016-07-26 NOTE — Telephone Encounter (Signed)
Received call to r/s pt lab/ov appt due to CT being r/s to end of month. gavept new appt date/time per request

## 2016-07-27 ENCOUNTER — Other Ambulatory Visit: Payer: Managed Care, Other (non HMO)

## 2016-07-27 ENCOUNTER — Ambulatory Visit (HOSPITAL_COMMUNITY): Payer: Managed Care, Other (non HMO)

## 2016-07-28 ENCOUNTER — Telehealth: Payer: Self-pay | Admitting: Hematology

## 2016-07-28 NOTE — Telephone Encounter (Signed)
Left message for patient confirming appointments now on schedule for 2/23 and 3/1. Appointments rescheduled due to PAL.

## 2016-07-28 NOTE — Telephone Encounter (Signed)
Patient contacted via [employer].

## 2016-08-03 ENCOUNTER — Ambulatory Visit: Payer: Managed Care, Other (non HMO) | Admitting: Hematology

## 2016-08-13 ENCOUNTER — Ambulatory Visit: Payer: Managed Care, Other (non HMO) | Admitting: Hematology

## 2016-08-13 ENCOUNTER — Other Ambulatory Visit: Payer: Self-pay | Admitting: *Deleted

## 2016-08-13 ENCOUNTER — Other Ambulatory Visit (HOSPITAL_BASED_OUTPATIENT_CLINIC_OR_DEPARTMENT_OTHER): Payer: Managed Care, Other (non HMO)

## 2016-08-13 DIAGNOSIS — C182 Malignant neoplasm of ascending colon: Secondary | ICD-10-CM

## 2016-08-13 LAB — COMPREHENSIVE METABOLIC PANEL
ALT: 77 U/L — AB (ref 0–55)
ANION GAP: 10 meq/L (ref 3–11)
AST: 49 U/L — AB (ref 5–34)
Albumin: 4.2 g/dL (ref 3.5–5.0)
Alkaline Phosphatase: 81 U/L (ref 40–150)
BILIRUBIN TOTAL: 0.25 mg/dL (ref 0.20–1.20)
BUN: 10.7 mg/dL (ref 7.0–26.0)
CHLORIDE: 104 meq/L (ref 98–109)
CO2: 26 meq/L (ref 22–29)
CREATININE: 0.8 mg/dL (ref 0.7–1.3)
Calcium: 9.9 mg/dL (ref 8.4–10.4)
EGFR: >90 mL/min/{1.73_m2} (ref >90)
Glucose: 101 mg/dl (ref 70–140)
Potassium: 4.2 mEq/L (ref 3.5–5.1)
Sodium: 140 mEq/L (ref 136–145)
TOTAL PROTEIN: 7.9 g/dL (ref 6.4–8.3)

## 2016-08-13 LAB — CBC WITH DIFFERENTIAL/PLATELET
BASO%: 0.5 % (ref 0.0–2.0)
Basophils Absolute: 0 10*3/uL (ref 0.0–0.1)
EOS%: 2 % (ref 0.0–7.0)
Eosinophils Absolute: 0.2 10*3/uL (ref 0.0–0.5)
HCT: 43.2 % (ref 38.4–49.9)
HGB: 14.5 g/dL (ref 13.0–17.1)
LYMPH%: 35.9 % (ref 14.0–49.0)
MCH: 31 pg (ref 27.2–33.4)
MCHC: 33.7 g/dL (ref 32.0–36.0)
MCV: 92.2 fL (ref 79.3–98.0)
MONO#: 0.5 10*3/uL (ref 0.1–0.9)
MONO%: 5.6 % (ref 0.0–14.0)
NEUT%: 56 % (ref 39.0–75.0)
NEUTROS ABS: 4.8 10*3/uL (ref 1.5–6.5)
Platelets: 184 10*3/uL (ref 140–400)
RBC: 4.68 10*6/uL (ref 4.20–5.82)
RDW: 13.4 % (ref 11.0–14.6)
WBC: 8.6 10*3/uL (ref 4.0–10.3)
lymph#: 3.1 10*3/uL (ref 0.9–3.3)

## 2016-08-14 LAB — CEA: CEA: 6.5 ng/mL — ABNORMAL HIGH (ref 0.0–4.7)

## 2016-08-16 ENCOUNTER — Ambulatory Visit (HOSPITAL_COMMUNITY)
Admission: RE | Admit: 2016-08-16 | Discharge: 2016-08-16 | Disposition: A | Discharge status: Home / Self Care | Payer: Managed Care, Other (non HMO) | Source: Ambulatory Visit | Attending: Hematology | Admitting: Hematology

## 2016-08-16 DIAGNOSIS — C182 Malignant neoplasm of ascending colon: Secondary | ICD-10-CM

## 2016-08-16 DIAGNOSIS — R918 Other nonspecific abnormal finding of lung field: Secondary | ICD-10-CM | POA: Insufficient documentation

## 2016-08-16 DIAGNOSIS — K760 Fatty (change of) liver, not elsewhere classified: Secondary | ICD-10-CM | POA: Diagnosis not present

## 2016-08-16 LAB — CEA (IN HOUSE-CHCC): CEA (CHCC-In House): 7.24 ng/mL — ABNORMAL HIGH (ref 0.00–5.00)

## 2016-08-16 MED ORDER — IOPAMIDOL (ISOVUE-300) INJECTION 61%
INTRAVENOUS | Status: AC
  Administered 2016-08-16: 75 mL
  Filled 2016-08-16: qty 75

## 2016-08-17 ENCOUNTER — Ambulatory Visit (HOSPITAL_BASED_OUTPATIENT_CLINIC_OR_DEPARTMENT_OTHER): Payer: Managed Care, Other (non HMO) | Admitting: Hematology

## 2016-08-17 ENCOUNTER — Encounter: Payer: Self-pay | Admitting: Hematology

## 2016-08-17 ENCOUNTER — Telehealth: Payer: Self-pay | Admitting: *Deleted

## 2016-08-17 VITALS — BP 138/85 | HR 65 | Temp 98.6°F | Resp 18 | Ht 68.0 in | Wt 141.5 lb

## 2016-08-17 DIAGNOSIS — Z720 Tobacco use: Secondary | ICD-10-CM | POA: Diagnosis not present

## 2016-08-17 DIAGNOSIS — C182 Malignant neoplasm of ascending colon: Secondary | ICD-10-CM

## 2016-08-17 DIAGNOSIS — C779 Secondary and unspecified malignant neoplasm of lymph node, unspecified: Secondary | ICD-10-CM

## 2016-08-17 DIAGNOSIS — G620 Drug-induced polyneuropathy: Secondary | ICD-10-CM

## 2016-08-17 NOTE — Telephone Encounter (Signed)
"  I need to pick up a letter from Dr. Burr Medico today or tomorrow to return to work.  She gave me a letter but they do not have light work.  I cannot stay home.  I am stronger, better, can do full work.  If the have any questions call my mobile [phone]."

## 2016-08-17 NOTE — Progress Notes (Signed)
John Moon  [phone] [phone]  Clinic follow Up Note   Patient Care Team: No Pcp Per Patient as PCP - General (General Practice) 08/17/2016   CHIEF COMPLAINTS:  Follow up stage III right colon cancer  Oncology History   Cancer of right colon [facility])   Staging form: Colon and Rectum, AJCC 7th Edition     Pathologic stage from 06/12/2015: Stage IIIC (T3, N2b, cM0) - Signed by Truitt Merle, MD on 07/07/2015       Cancer of right colon ([place])   06/09/2015 Imaging    CT chest abdomen and pelvis showed proximal and a transverse colitis, fluid-filled and dilated transverse colon indicating of obstruction related to a mass at the junction of the transverse and descending colon. trace left pleural effusion.       06/10/2015 Initial Diagnosis    Cancer of right colon ([place])      06/10/2015 Procedure    Colonoscopy showed a friable ulcerated mucosa in distal to mid transverse with stricture, scope couldn't be advanced beyond the. 11 sessile polyps removed.      06/12/2015 Surgery    Right hemicolectomy and lymph node dissection.      06/12/2015 Pathology Results    Moderately differentiated colonic adenocarcinoma, 3 cm, G2, located in the transverse colon, LVI(-), perineural invasion (-), T3, margins were negative, 7 out of 30 lymph nodes were positive, polyps were tubular adenoma.      07/31/2015 - 01/06/2016 Chemotherapy    adjuvant chemo CAPEOX, oxaliplatin 130 mg/m, Xeloda 1000 mg/m on day 1-14, every 21 days, S/P 8 cycles       08/16/2016 Imaging    CT CAP w/ CONTRAST 08/16/16 IMPRESSION: 1. No evidence metastatic disease in the chest, abdomen or pelvis. 2. Subpleural nodularity at both lung bases is stable from 2016, probably subpleural lymph nodes. 3. Mild hepatic steatosis. 4. Bilateral L5 pars defects.       HISTORY OF PRESENTING ILLNESS:  John Moon 46 y.o. male is here because of His recently diagnosed stage III colon  cancer. He is accompanied by his wife, half-brother, son, and mother to the clinic today.   He presented to [facility] emergency room with sudden onset abdominal pain and nausea for 2 days on 06/09/2015. CT scan reviewed follow-up suction and I palpable mass at the junction of the transverse and descending colon. He was admitted to the hospital, underwent colonoscopy on 06/10/2015, which showed a friable ulcerated mass in distal to mid transverse colon with structure, scope was not able to advance beyond that. The biopsy of the colon mass showed adenocarcinoma. He also had at least 11 since I polyps removed it he underwent right hemicolectomy and lymph node dissection by Dr. Marlou Starks on 06/12/2015. He tolerated the surgery very well, was discharged home on 06/17/2015.   He has recovered well from his surgery, denies any pain, nausea, change of his bowel habits. He has good appetite and energy level. No other complaints. 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   CURRENT THERAPY:  Cancer surveillance  INTERIM HISTORY: John Moon returns for follow up. He has been doing well. He feels stronger. He just came back from a month long trip to [place]. Denies cough, nausea, abdominal pain, or any other concerns.   MEDICAL HISTORY:  Past Medical History:  Diagnosis Date  . Colon cancer ([place])     SURGICAL HISTORY:  Past Surgical History:  Procedure Laterality Date  . COLONOSCOPY WITH PROPOFOL N/A 06/10/2015   Procedure: COLONOSCOPY WITH PROPOFOL;  Surgeon: Mauri Pole, MD;  Location: [facility] ENDOSCOPY;  Service: Endoscopy;  Laterality: N/A;  . LAPAROSCOPIC PARTIAL COLECTOMY N/A 06/12/2015   Procedure: LAPAROSCOPIC ASSISTED  PARTIAL COLECTOMY;  Surgeon: Autumn Messing III, MD;  Location: [facility] ORS;  Service: General;  Laterality: N/A;  . PARTIAL COLECTOMY  06/12/2015   Procedure: PARTIAL COLECTOMY;  Surgeon: Autumn Messing III, MD;  Location: [facility] ORS;   Service: General;;    SOCIAL HISTORY: Social History   Social History  . Marital Status: Married     Spouse Name: N/A  . Number of Children: 2  . Years of Education: N/A   Occupational History  . Not on file.   Social History Main Topics  . Smoking status: Current Every Day Smoker -- 1.00 packs/day    Types: Cigarettes  . Smokeless tobacco: Never Used  . Alcohol Use: 4.2 oz/week    2 Standard drinks or equivalent, 5 Cans of beer per week  . Drug Use: No  . Sexual Activity: No   Other Topics Concern  . 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 No pertinent family history. no family history of colon cancer or other malignancy.   ALLERGIES:  has No Known Allergies.  MEDICATIONS:  Current Outpatient Prescriptions  Medication Sig Dispense Refill  . capecitabine (XELODA) 150 MG tablet Take 1 tablet (150 mg total) by mouth 2 (two) times daily after a meal. Take on days 1-14 of chemotherapy. 28 tablet 1  . capecitabine (XELODA) 500 MG tablet Take 3 tablets (1,500 mg total) by mouth 2 (two) times daily after a meal. Take on days 1-14 of chemotherapy. 84 tablet 1  . ondansetron (ZOFRAN) 8 MG tablet Take 1 tablet (8 mg total) by mouth 2 (two) times daily as needed for refractory nausea / vomiting. Start on day 3 after chemotherapy. (Patient not taking: Reported on 08/11/2015) 30 tablet 1  . oxyCODONE-acetaminophen (ROXICET) 5-325 MG tablet Take 1-2 tablets by mouth every 4 (four) hours as needed for severe pain. (Patient not taking: Reported on 08/11/2015) 30 tablet 0  . potassium chloride SA (K-DUR,KLOR-CON) 20 MEQ tablet Take 1 tablet (20 mEq total) by mouth 2 (two) times daily. 20 tablet 0  . prochlorperazine (COMPAZINE) 10 MG tablet Take 1 tablet (10 mg  total) by mouth every 6 (six) hours as needed (Nausea or vomiting). (Patient not taking: Reported on 08/11/2015) 30 tablet 1   No current facility-administered medications for this visit.     REVIEW OF SYSTEMS:   Constitutional: Denies fevers, chills or abnormal night sweats Eyes: Denies blurriness of vision, double vision or watery eyes Ears, nose, mouth, throat, and face: Denies mucositis or sore throat Respiratory: Denies cough, dyspnea or wheezes Cardiovascular: Denies palpitation, chest discomfort or lower extremity swelling Gastrointestinal:  Denies nausea, heartburn or change in bowel habits Skin: Denies abnormal skin rashes Lymphatics: Denies new lymphadenopathy or easy bruising Neurological:Denies numbness, tingling or new weaknesses Behavioral/Psych: Mood is stable, no new changes  All other systems were reviewed with the patient and are negative.  PHYSICAL EXAMINATION: ECOG PERFORMANCE STATUS: 0  BP 138/85 (BP Location: Left  Arm, Patient Position: Sitting)   Pulse 65   Temp 98.6 F (37 C) (Oral)   Resp 18   Ht _0  (1.727 m)   Wt 141 lb 8 oz (64.2 kg)   SpO2 100%   BMI 21.52 kg/m    GENERAL:alert, no distress and comfortable SKIN: skin color, texture, turgor are normal, no rashes or significant lesions EYES: normal, conjunctiva are pink and non-injected, sclera clear OROPHARYNX:no exudate, no erythema and lips, buccal mucosa, and tongue normal  NECK: supple, thyroid normal size, non-tender, without nodularity LYMPH:  no palpable lymphadenopathy in the cervical, axillary or inguinal LUNGS: clear to auscultation and percussion with normal breathing effort HEART: regular rate & rhythm and no murmurs and no lower extremity edema ABDOMEN:abdomen soft, non-tender and normal bowel sounds, surgical incision sites are well healed. Musculoskeletal:no cyanosis of digits and no clubbing  PSYCH: alert & oriented x 3 with fluent speech NEURO: no focal motor/sensory  deficits  LABORATORY DATA:  I have reviewed the data as listed CBC Latest Ref Rng & Units 08/13/2016 04/06/2016 01/06/2016  WBC 4.0 - 10.3 10e3/uL 8.6 10.0 5.5  Hemoglobin 13.0 - 17.1 g/dL 14.5 15.3 13.8  Hematocrit 38.4 - 49.9 % 43.2 43.2 39.6  Platelets 140 - 400 10e3/uL 184 189 112(L)   CMP Latest Ref Rng & Units 08/13/2016 04/06/2016 01/06/2016  Glucose 70 - 140 mg/dl 101 98 108  BUN 7.0 - 26.0 mg/dL 10.7 8.5 4.7(L)  Creatinine 0.7 - 1.3 mg/dL 0.8 0.8 0.8  Sodium 136 - 145 mEq/L 140 139 139  Potassium 3.5 - 5.1 mEq/L 4.2 4.1 3.3(L)  Chloride 101 - 111 mmol/L - - -  CO2 22 - 29 mEq/L 26 21(L) 23  Calcium 8.4 - 10.4 mg/dL 9.9 9.6 9.0  Total Protein 6.4 - 8.3 g/dL 7.9 8.3 7.4  Total Bilirubin 0.20 - 1.20 mg/dL 0.25 0.22 0.52  Alkaline Phos 40 - 150 U/L 81 99 99  AST 5 - 34 U/L 49(H) 21 22  ALT 0 - 55 U/L 77(H) 38 26   CEA: 06/11/2015: 26.3 08/11/2015: 5.6 10/29/2015: 5.8 04/06/2016: 8.3 08/13/2016: 6.5  Pathology report Diagnosis 06/12/2015 Colon, segmental resection for tumor, transverse and right colon MODERATELY DIFFERENTIATED COLONIC ADENOCARCINOMA (3 CM) THE TUMOR INVADE THROUGH THE MUSCULARIS PROPRIA INTO PERICOLONIC TISSUE (T3) ALL MARGINS OF RESECTION ARE NEGATIVE FOR TUMOR METASTATIC COLONIC ADENOCARCINOMA IN SEVEN OF THIRTY LYMPH NODES (7/30, N2B) TUBULAR ADENOMA (X1) TUBULAR ADENOMA INVOLVING APPENDIX Microscopic Comment COLON AND RECTUM (INCLUDING TRANS-ANAL RESECTION): Specimen: Right and transverse colon Procedure: segmental resection Tumor site: Transverse colon Specimen integrity: Intact Macroscopic intactness of mesorectum: Not applicable: _ Macroscopic tumor perforation: Invading through the muscularis propria into pericolonic soft tissue Invasive tumor: Maximum size: 3 cm Histologic type(s): Adenocarcinoma Histologic grade and differentiation: G2 G1: well differentiated/low grade G2: moderately differentiated/low grade G3: poorly  differentiated/high grade G4: undifferentiated/high grade Type of polyp in which invasive carcinoma arose: Tubular adenoma Microscopic extension of invasive tumor: Invading through the muscularis propria into pericolonic soft tissue Lymph-Vascular invasion: Negative Peri-neural invasion: Negative 1 of 3 FINAL for Schupp, Esdras V ([id number]) Microscopic Comment(continued) Tumor deposit(s) (discontinuous extramural extension): Negative Resection margins: Proximal margin: negative Distal margin: negative Circumferential (radial) (posterior ascending, posterior descending; lateral and posterior mid-rectum; and entire lower 1/3 rectum): negative Mesenteric margin (sigmoid and transverse): negative Distance closest margin (if all above margins negative): 3.5 cm Treatment effect (neo-adjuvant therapy): Negative Additional polyp(s): Tubular adenoma Non-neoplastic findings: Unremarkable Lymph nodes: number examined 30; number positive: 7  Pathologic Staging: T3, N2b, M_ Ancillary studies: Microsatelite instability PCR ordered Casimer Lanius MD Pathologist, Electronic Signature (Case signed 06/17/2015)    pathology  RADIOGRAPHIC STUDIES: I have personally reviewed the radiological images as listed and agreed with the findings in the report.  CT CAP w contrast 08/16/2016 IMPRESSION: 1. No evidence metastatic disease in the chest, abdomen or pelvis. 2. Subpleural nodularity at both lung bases is stable from 2016, probably subpleural lymph nodes. 3. Mild hepatic steatosis. 4. Bilateral L5 pars defects.  ENDOSCOPIC IMPRESSION: 06/10/2015 Friable ulcerated mucosa in distal to mid transverse with stricture, scope couldn't be advanced beyond it, ultiple biopsies taken from the edge of the mass. 11 sessile polyps removed   ASSESSMENT & PLAN:   46 y.o. [place] male, without significant past medical history, presented with biopsy action.  Colonoscopy showed a ulcerative mass in the  transverse colon, and multiple polyps.   1. Right colon adenocarcinoma, pT3N2bM0, stage IIIB, MSI-stable  -I previously reviewed his CT scan and surgical path in great details, including the staging, and molecular features.  -I previously reviewed the nature history of colon cancer, and risk of cancer recurrence after surgery. Due to his locally advanced disease, especially multiple (7) positive nodes, his risk of recurrence is very high.  -He has completed adjuvant chemo with CAPEOX for 6 months, and tolerated well. -He is clinically doing well, asymptomatic except mild fatigue and neuropathy from chemotherapy, exam was unremarkable, lab results reviewed with him, no significant clinical concern for recurrence. -He has had a mild elevated CEA since his surgery, surveillance CT scan showed no evidence of recurrence, is probably not related to his cancer. -I reviewed the CT scan from 08/17/2015 with the patient, which showed no evidence of recurrence   -Repeat scans February 2019. -Ordered a colonoscopy for within the next month. He is overdue.  -We will continue cancer surveillance. He will follow up with me every 4 months with lab and exam for the next 1-2 years, then every 6 months for total 5 years. I'll repeat a CT scan every 12 months (insurance denied more frequent scans) if he is clinically doing well   2. 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  3. Transaminitis -He has had mild intermittent transaminitis, likely benign, we'll continue monitoring.  Plan -He needs a civilian colonoscopy asap, I will send a message to Dr. Silverio Decamp -Lab and follow-up in 4 months -I will write him a letter allowing him to go back to work with no restrictions.    All questions were answered. The patient knows to call the clinic with any problems, questions or concerns.  I spent 25  minutes counseling the patient face to face. The total time spent in the appointment was 30 minutes and more than 50% was on counseling.  This document serves as a record of services personally performed by Truitt Merle, MD. It was created on her behalf by Martinique Casey, a trained medical scribe. The creation of this record is based on the scribe's personal observations and the provider's statements to them. This document has been checked and approved by the attending provider.  I have reviewed the above documentation for accuracy and completeness and I agree with the above.   Truitt Merle, MD 08/17/2016

## 2016-08-17 NOTE — Telephone Encounter (Signed)
Thu,   Please move his appointment from 3/1 to today at 3:15 or 3:45 today and I will write a letter for him after seeing him. thanks  Truitt Merle MD

## 2016-08-17 NOTE — Telephone Encounter (Signed)
Spoke with pt and instructed pt to come in today for office visit with Dr. Burr Medico at  315 pm .  Pt voiced understanding.

## 2016-08-18 ENCOUNTER — Telehealth: Payer: Self-pay | Admitting: Gastroenterology

## 2016-08-18 NOTE — Telephone Encounter (Signed)
-----   Message from Greggory Keen, LPN sent at [id number]  9:52 AM EST ----- He needs a direct to the [facility] for a colonoscopy with Dr Silverio Decamp. Thanks ----- Message ----- From: Mauri Pole, MD Sent: 08/17/2016  10:55 PM To: Greggory Keen, LPN, Truitt Merle, MD  Will try to get him in soon for surveillance colonoscopy.  Beth, can you please schedule him for colonoscopy in [place] Thanks VN ----- Message ----- From: Truitt Merle, MD Sent: 08/17/2016  10:24 PM To: Mauri Pole, MD  Dr. Silverio Decamp,  He is overdue for colonoscopy, could you get him in?  Thanks,  Krista Blue

## 2016-08-18 NOTE — Telephone Encounter (Signed)
Left message for patient to return my call.

## 2016-08-19 ENCOUNTER — Ambulatory Visit: Payer: Managed Care, Other (non HMO) | Admitting: Hematology

## 2016-11-05 IMAGING — CT CT CHEST W/ CM
2 of 4 series · 15 of 36 positions shown, 18 images · IV contrast (OMNIPAQUE)
Comparison: CT abdomen pelvis 06/09/2015.

CLINICAL DATA: Restaging colon cancer.

EXAM:
CT CHEST WITH CONTRAST
TECHNIQUE: Multidetector CT imaging of the chest was performed during
intravenous contrast administration.
CONTRAST:  75mL OMNIPAQUE IOHEXOL 300 MG/ML  SOLN

[Series 2: chest with st · axial · 0.71mm/px · z∈[-298,-38]mm · 12 of 61 slices shown, 15 images]
[im 5/61  mediastinal]
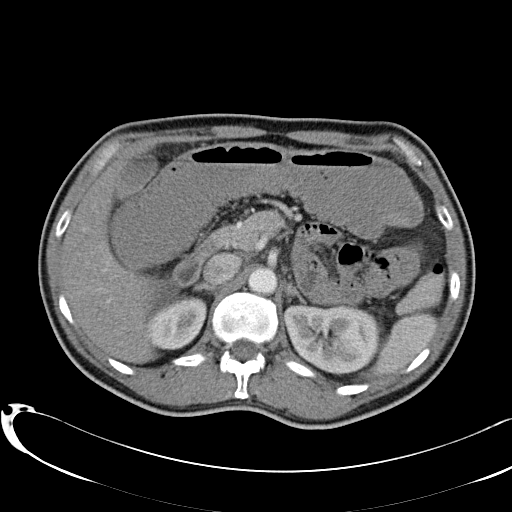
[im 5/61  lung]
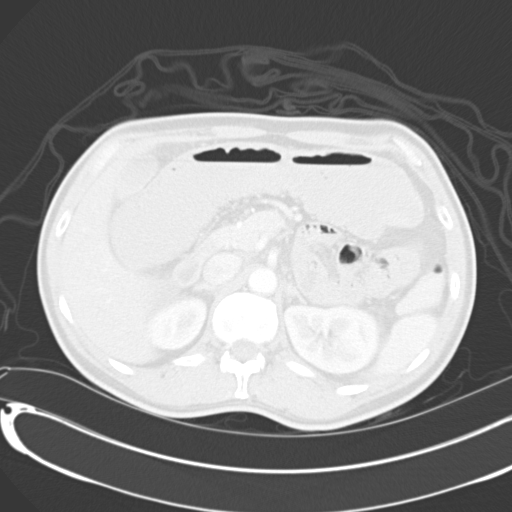
[im 9/61  lung]
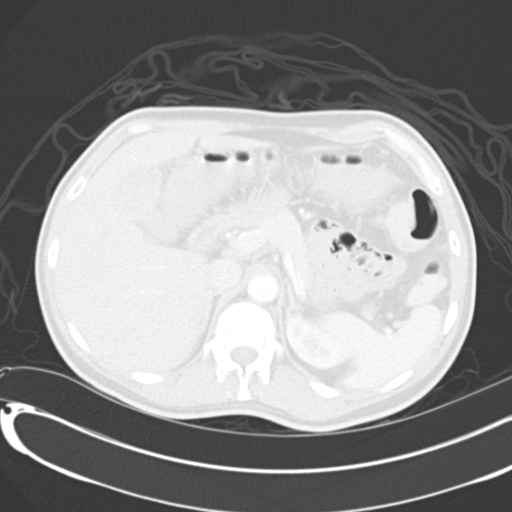
[im 13/61  lung]
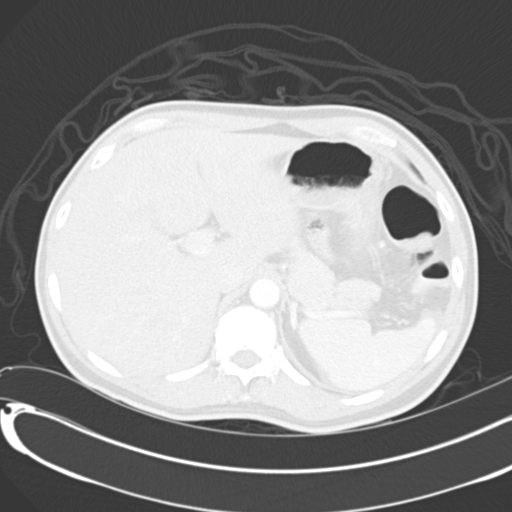
[im 21/61  lung]
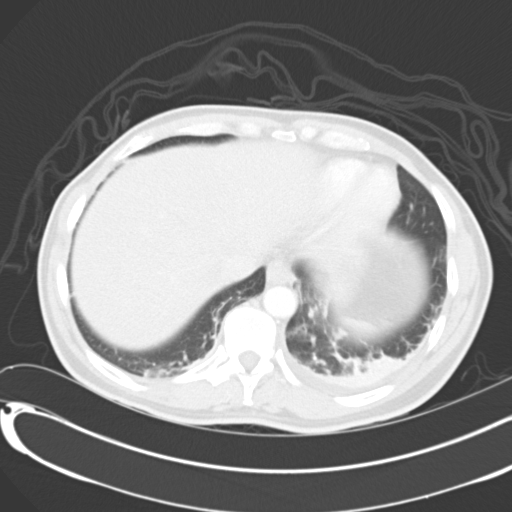
[im 25/61  mediastinal]
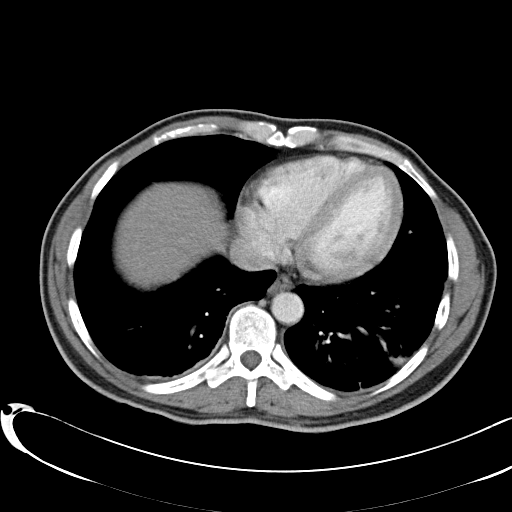
[im 25/61  lung]
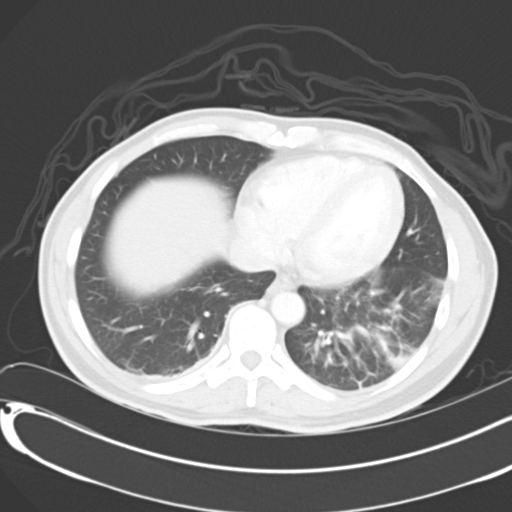
[im 29/61  lung]
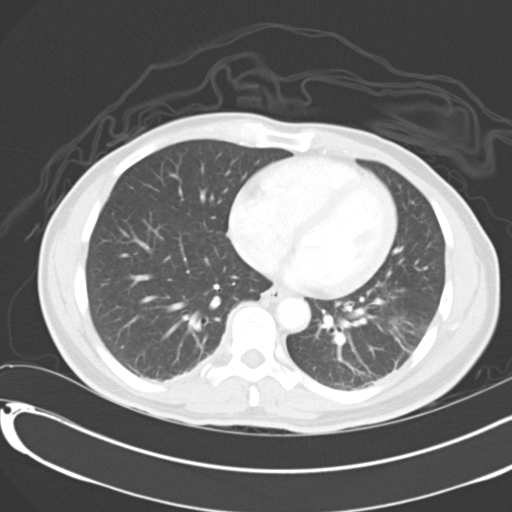
[im 33/61  lung]
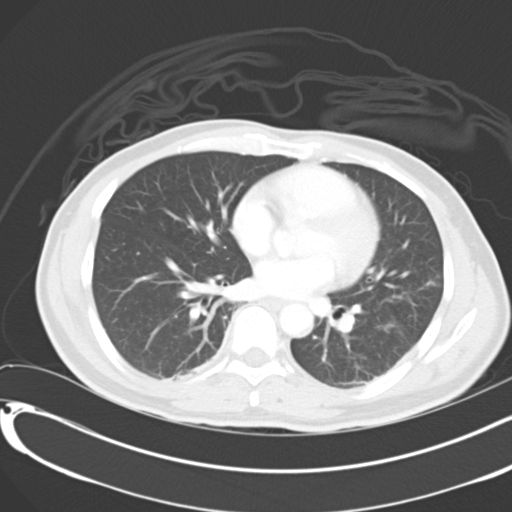
[im 37/61  lung]
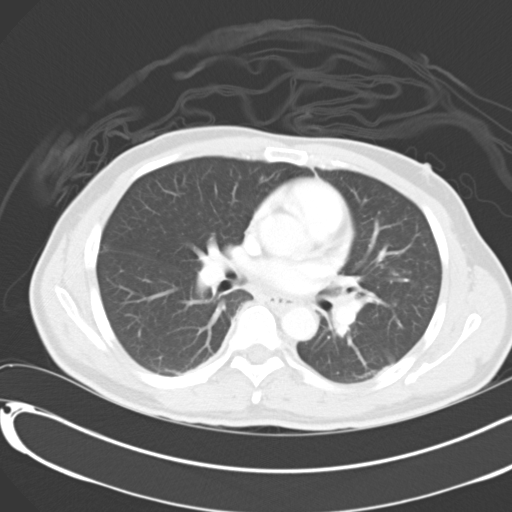
[im 41/61  mediastinal]
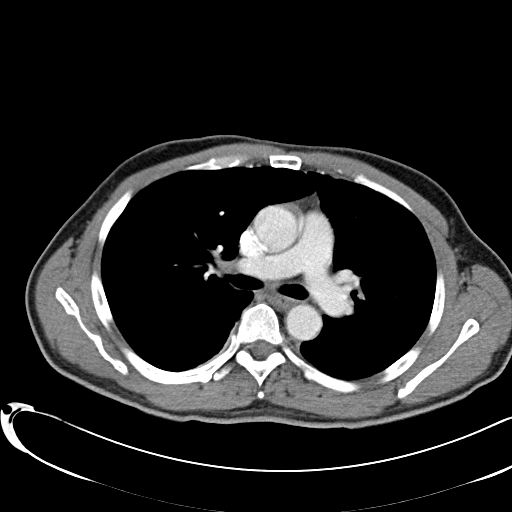
[im 41/61  lung]
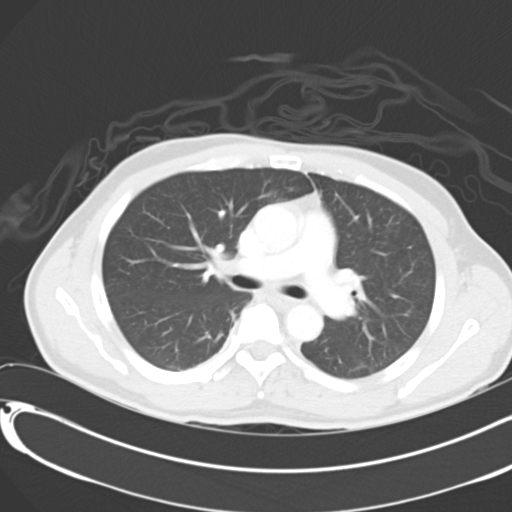
[im 49/61  lung]
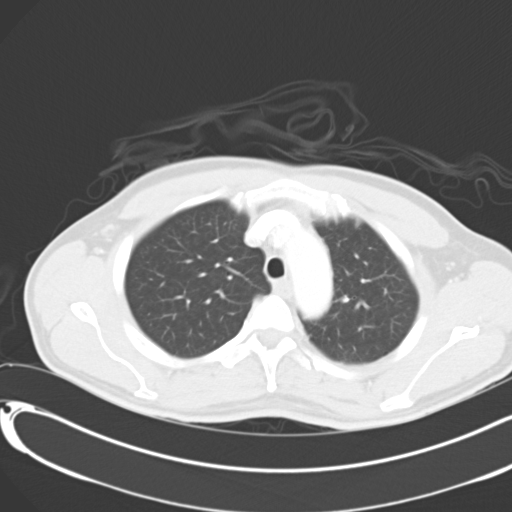
[im 53/61  lung]
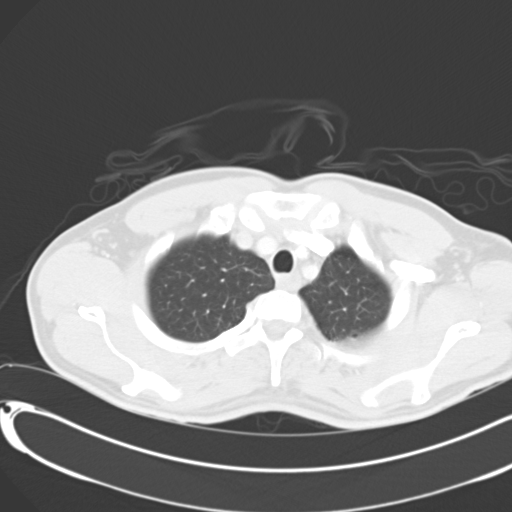
[im 57/61  lung]
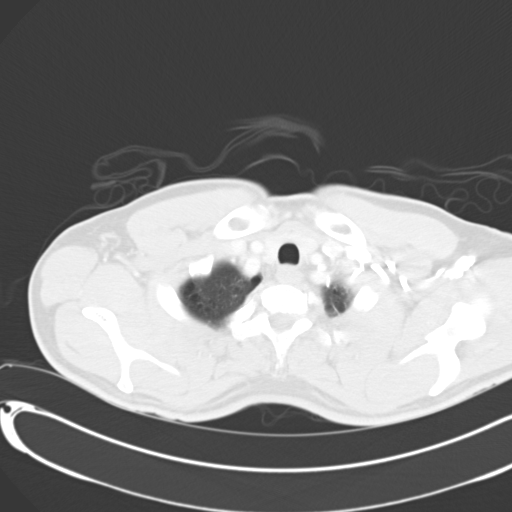

[Series 602: cor · coronal · 0.71mm/px · 3 of 72 slices shown]
[im 15/72  lung]
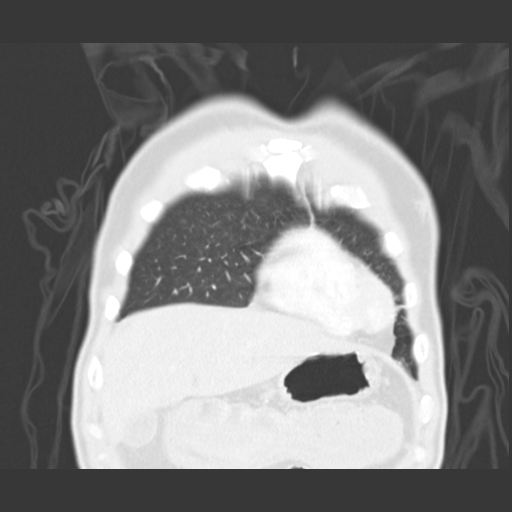
[im 29/72  lung]
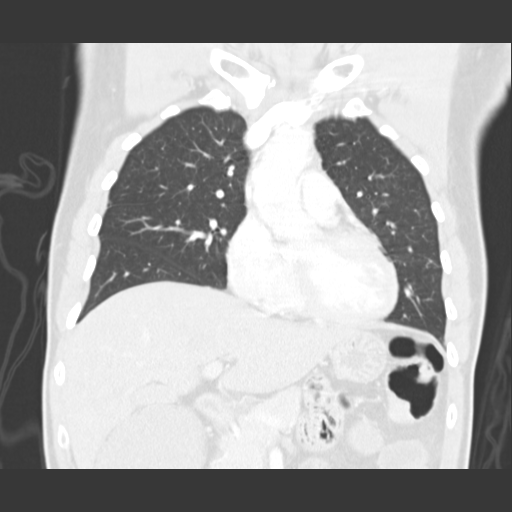
[im 43/72  lung]
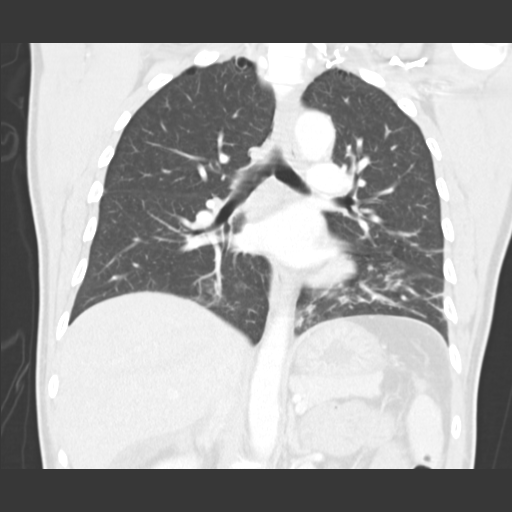

[15 of 36 positions shown; findings below may reference images not displayed]

FINDINGS: Mediastinum/Nodes: No pathologically enlarged mediastinal, hilar or
axillary lymph nodes. Heart size normal. No pericardial effusion.

Lungs/Pleura: Trace left pleural effusion. Trace paraseptal
emphysema. Subsegmental atelectasis in the lower lobes, left greater
than right. Peribronchovascular ground-glass nodularity in the left
upper lobe (example series 5, image 23). Airway is unremarkable.

Upper abdomen: Visualized portion of the liver is unremarkable.
There may be vicarious excretion of contrast in the gallbladder.
Visualized portions of the adrenal glands, kidneys, spleen, pancreas
and stomach are grossly unremarkable. Dilated fluid-filled
transverse colon with a mass at the junction of the transverse and
descending colon (series 2, image 55). Small ascites.

Musculoskeletal: No worrisome lytic or sclerotic lesions.
IMPRESSION: 1. No evidence of metastatic disease in the chest.
2. Trace left pleural effusion with probable atelectasis in the left
lower lobe.
3. Peribronchovascular ground-glass nodularity in the left upper
lobe is likely infectious or inflammatory in etiology.
4. Fluid-filled and dilated transverse colon, indicative of
obstruction related to a mass at the junction of the transverse and
descending colon. Please refer to CT abdomen pelvis 06/09/2015 for
further details.

## 2016-12-08 ENCOUNTER — Telehealth: Payer: Self-pay | Admitting: Hematology

## 2016-12-08 NOTE — Telephone Encounter (Signed)
Lvm via translator asking pt to call office regarding his appt on 6/26. This appt has been moved to 7/26 due to md pal.

## 2016-12-14 ENCOUNTER — Other Ambulatory Visit: Payer: Managed Care, Other (non HMO)

## 2016-12-14 ENCOUNTER — Ambulatory Visit: Payer: Managed Care, Other (non HMO) | Admitting: Hematology

## 2017-01-07 NOTE — Progress Notes (Signed)
Open in error  This encounter was created in error - please disregard.

## 2017-01-13 ENCOUNTER — Encounter: Payer: Managed Care, Other (non HMO) | Admitting: Hematology

## 2017-01-13 ENCOUNTER — Other Ambulatory Visit: Payer: Managed Care, Other (non HMO)

## 2017-01-13 NOTE — Progress Notes (Signed)
[facility]  [phone] [phone]  Clinic follow Up Note   Patient Care Team: Patient, No Pcp Per as PCP - General (General Practice) 01/15/2017   CHIEF COMPLAINTS:  Follow up stage III right colon cancer  Oncology History   Cancer of right colon [facility])   Staging form: Colon and Rectum, AJCC 7th Edition     Pathologic stage from 06/12/2015: Stage IIIC (T3, N2b, cM0) - Signed by Truitt Merle, MD on 07/07/2015       Cancer of right colon ([facility])   06/09/2015 Imaging    CT chest abdomen and pelvis showed proximal and a transverse colitis, fluid-filled and dilated transverse colon indicating of obstruction related to a mass at the junction of the transverse and descending colon. trace left pleural effusion.       06/10/2015 Initial Diagnosis    Cancer of right colon ([place])      06/10/2015 Procedure    Colonoscopy showed a friable ulcerated mucosa in distal to mid transverse with stricture, scope couldn't be advanced beyond the. 11 sessile polyps removed.      06/12/2015 Surgery    Right hemicolectomy and lymph node dissection.      06/12/2015 Pathology Results    Moderately differentiated colonic adenocarcinoma, 3 cm, G2, located in the transverse colon, LVI(-), perineural invasion (-), T3, margins were negative, 7 out of 30 lymph nodes were positive, polyps were tubular adenoma.      07/31/2015 - 01/06/2016 Chemotherapy    adjuvant chemo CAPEOX, oxaliplatin 130 mg/m, Xeloda 1000 mg/m on day 1-14, every 21 days, S/P 8 cycles       08/16/2016 Imaging    CT CAP w/ CONTRAST 08/16/16 IMPRESSION: 1. No evidence metastatic disease in the chest, abdomen or pelvis. 2. Subpleural nodularity at both lung bases is stable from 2016, probably subpleural lymph nodes. 3. Mild hepatic steatosis. 4. Bilateral L5 pars defects.       HISTORY OF PRESENTING ILLNESS:  John Moon 46 y.o. male is here because of His recently diagnosed stage III colon  cancer. He is accompanied by his wife, half-brother, son, and mother to the clinic today.   He presented to [facility] emergency room with sudden onset abdominal pain and nausea for 2 days on 06/09/2015. CT scan reviewed follow-up suction and I palpable mass at the junction of the transverse and descending colon. He was admitted to the hospital, underwent colonoscopy on 06/10/2015, which showed a friable ulcerated mass in distal to mid transverse colon with structure, scope was not able to advance beyond that. The biopsy of the colon mass showed adenocarcinoma. He also had at least 11 since I polyps removed it he underwent right hemicolectomy and lymph node dissection by Dr. Marlou Starks on 06/12/2015. He tolerated the surgery very well, was discharged home on 06/17/2015.   He has recovered well from his surgery, denies any pain, nausea, change of his bowel habits. He has good appetite and energy level. No other complaints. 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   CURRENT THERAPY:  Cancer surveillance  INTERIM HISTORY: Josha returns for follow up. The patient reports he is doing well. He has quit his job because the work was difficult with his medical conditions. He reports he is eating very well. He denies pain or bloating. He denies fatigue. Denies neuropathy. Overall the patient is without concerns.   MEDICAL HISTORY:  Past  Medical History:  Diagnosis Date  . Colon cancer [facility])     SURGICAL HISTORY: Past Surgical History:  Procedure Laterality Date  . COLONOSCOPY WITH PROPOFOL N/A 06/10/2015   Procedure: COLONOSCOPY WITH PROPOFOL;  Surgeon: Mauri Pole, MD;  Location: [facility] ENDOSCOPY;  Service: Endoscopy;  Laterality: N/A;  . LAPAROSCOPIC PARTIAL COLECTOMY N/A 06/12/2015   Procedure: LAPAROSCOPIC ASSISTED  PARTIAL COLECTOMY;  Surgeon: Autumn Messing III, MD;  Location: [facility] ORS;  Service: General;  Laterality: N/A;  . PARTIAL  COLECTOMY  06/12/2015   Procedure: PARTIAL COLECTOMY;  Surgeon: Autumn Messing III, MD;  Location: [facility] ORS;  Service: General;;    SOCIAL HISTORY: Social History   Social History  . Marital Status: Married     Spouse Name: N/A  . Number of Children: 2  . Years of Education: N/A   Occupational History  . Not on file.   Social History Main Topics  . Smoking status: Current Every Day Smoker -- 1.00 packs/day    Types: Cigarettes  . Smokeless tobacco: Never Used  . Alcohol Use: 4.2 oz/week    2 Standard drinks or equivalent, 5 Cans of beer per week  . Drug Use: No  . Sexual Activity: No   Other Topics Concern  . 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 No pertinent family history. no family history of colon cancer or other malignancy.   ALLERGIES:  has No Known Allergies.  MEDICATIONS:  Current Outpatient Prescriptions  Medication Sig Dispense Refill  . capecitabine (XELODA) 150 MG tablet Take 1 tablet (150 mg total) by mouth 2 (two) times daily after a meal. Take on days 1-14 of chemotherapy. 28 tablet 1  . capecitabine (XELODA) 500 MG tablet Take 3 tablets (1,500 mg total) by mouth 2 (two) times daily after a meal. Take on days 1-14 of chemotherapy. 84 tablet 1  . ondansetron (ZOFRAN) 8 MG tablet Take 1 tablet (8 mg total) by mouth 2 (two) times daily as needed for refractory nausea / vomiting. Start on day 3 after chemotherapy. (Patient not taking: Reported on 08/11/2015) 30 tablet 1  . oxyCODONE-acetaminophen (ROXICET) 5-325 MG tablet Take 1-2 tablets by mouth every 4 (four) hours as needed for severe pain. (Patient not taking: Reported on 08/11/2015) 30 tablet 0  . potassium chloride SA (K-DUR,KLOR-CON) 20 MEQ tablet Take 1 tablet (20 mEq total) by  mouth 2 (two) times daily. 20 tablet 0  . prochlorperazine (COMPAZINE) 10 MG tablet Take 1 tablet (10 mg total) by mouth every 6 (six) hours as needed (Nausea or vomiting). (Patient not taking: Reported on 08/11/2015) 30 tablet 1   No current facility-administered medications for this visit.     REVIEW OF SYSTEMS:  Constitutional: Denies fevers, chills or abnormal night sweats, (+) good appetite Eyes: Denies blurriness of vision, double vision or watery eyes Ears, nose, mouth, throat, and face: Denies mucositis or sore throat Respiratory: Denies cough, dyspnea or wheezes Cardiovascular: Denies palpitation, chest discomfort or lower extremity swelling Gastrointestinal:  Denies nausea, heartburn or change in bowel habits Skin: Denies abnormal skin rashes Lymphatics: Denies new lymphadenopathy or easy bruising Neurological:Denies numbness, tingling or new weaknesses Behavioral/Psych: Mood is stable, no new changes  All other systems were reviewed with  the patient and are negative.  PHYSICAL EXAMINATION: ECOG PERFORMANCE STATUS: 0  BP 130/83 (BP Location: Left Arm, Patient Position: Sitting)   Pulse 69   Temp 98 F (36.7 C) (Oral)   Resp 18   Ht 5\' 8"  (1.727 m)   Wt 144 lb 9.6 oz (65.6 kg)   SpO2 100%   BMI 21.99 kg/m    GENERAL:alert, no distress and comfortable SKIN: skin color, texture, turgor are normal, no rashes or significant lesions EYES: normal, conjunctiva are pink and non-injected, sclera clear OROPHARYNX:no exudate, no erythema and lips, buccal mucosa, and tongue normal  NECK: supple, thyroid normal size, non-tender, without nodularity LYMPH:  no palpable lymphadenopathy in the cervical, axillary or inguinal LUNGS: clear to auscultation and percussion with normal breathing effort HEART: regular rate & rhythm and no murmurs and no lower extremity edema ABDOMEN:abdomen soft, non-tender and normal bowel sounds, surgical incision sites are well healed. Musculoskeletal:no  cyanosis of digits and no clubbing  PSYCH: alert & oriented x 3 with fluent speech NEURO: no focal motor/sensory deficits  LABORATORY DATA:  I have reviewed the data as listed CBC Latest Ref Rng & Units 01/14/2017 08/13/2016 04/06/2016  WBC 4.0 - 10.3 10e3/uL 13.3(H) 8.6 10.0  Hemoglobin 13.0 - 17.1 g/dL 04/08/2016 83.2 91.9  Hematocrit 38.4 - 49.9 % 46.7 43.2 43.2  Platelets 140 - 400 10e3/uL 200 184 189   CMP Latest Ref Rng & Units 01/14/2017 08/13/2016 04/06/2016  Glucose 70 - 140 mg/dl 93 04/08/2016 98  BUN 7.0 - 060 mg/dL 6.3(L) 10.7 8.5  Creatinine 0.7 - 1.3 mg/dL 0.8 0.8 0.8  Sodium 04.5 - 145 mEq/L 140 140 139  Potassium 3.5 - 5.1 mEq/L 3.6 4.2 4.1  Chloride 101 - 111 mmol/L - - -  CO2 22 - 29 mEq/L 25 26 21(L)  Calcium 8.4 - 10.4 mg/dL 997 9.9 9.6  Total Protein 6.4 - 8.3 g/dL 8.2 7.9 8.3  Total Bilirubin 0.20 - 1.20 mg/dL 74.1 4.23 9.53  Alkaline Phos 40 - 150 U/L 97 81 99  AST 5 - 34 U/L 34 49(H) 21  ALT 0 - 55 U/L 80(H) 77(H) 38   CEA: 06/11/2015: 26.3 08/11/2015: 5.6 10/29/2015: 5.8 04/06/2016: 8.3 08/13/2016: 6.5 01/14/17: PENDING  Pathology report Diagnosis 06/12/2015 Colon, segmental resection for tumor, transverse and right colon MODERATELY DIFFERENTIATED COLONIC ADENOCARCINOMA (3 CM) THE TUMOR INVADE THROUGH THE MUSCULARIS PROPRIA INTO PERICOLONIC TISSUE (T3) ALL MARGINS OF RESECTION ARE NEGATIVE FOR TUMOR METASTATIC COLONIC ADENOCARCINOMA IN SEVEN OF THIRTY LYMPH NODES (7/30, N2B) TUBULAR ADENOMA (X1) TUBULAR ADENOMA INVOLVING APPENDIX Microscopic Comment COLON AND RECTUM (INCLUDING TRANS-ANAL RESECTION): Specimen: Right and transverse colon Procedure: segmental resection Tumor site: Transverse colon Specimen integrity: Intact Macroscopic intactness of mesorectum: Not applicable: _ Macroscopic tumor perforation: Invading through the muscularis propria into pericolonic soft tissue Invasive tumor: Maximum size: 3 cm Histologic type(s): Adenocarcinoma Histologic  grade and differentiation: G2 G1: well differentiated/low grade G2: moderately differentiated/low grade G3: poorly differentiated/high grade G4: undifferentiated/high grade Type of polyp in which invasive carcinoma arose: Tubular adenoma Microscopic extension of invasive tumor: Invading through the muscularis propria into pericolonic soft tissue Lymph-Vascular invasion: Negative Peri-neural invasion: Negative 1 of 3 FINAL for Divito, Dez V 02-23-1987) Microscopic Comment(continued) Tumor deposit(s) (discontinuous extramural extension): Negative Resection margins: Proximal margin: negative Distal margin: negative Circumferential (radial) (posterior ascending, posterior descending; lateral and posterior mid-rectum; and entire lower 1/3 rectum): negative Mesenteric margin (sigmoid and transverse): negative Distance closest margin (if all above margins negative): 3.5 cm  Treatment effect (neo-adjuvant therapy): Negative Additional polyp(s): Tubular adenoma Non-neoplastic findings: Unremarkable Lymph nodes: number examined 30; number positive: 7 Pathologic Staging: T3, N2b, M_ Ancillary studies: Microsatelite instability PCR ordered Marlena Clipper MD Pathologist, Electronic Signature (Case signed 06/17/2015)    pathology  RADIOGRAPHIC STUDIES: I have personally reviewed the radiological images as listed and agreed with the findings in the report.  CT CAP w contrast 08/16/2016 IMPRESSION: 1. No evidence metastatic disease in the chest, abdomen or pelvis. 2. Subpleural nodularity at both lung bases is stable from 2016, probably subpleural lymph nodes. 3. Mild hepatic steatosis. 4. Bilateral L5 pars defects.  ENDOSCOPIC IMPRESSION: 06/10/2015 Friable ulcerated mucosa in distal to mid transverse with stricture, scope couldn't be advanced beyond it, ultiple biopsies taken from the edge of the mass. 11 sessile polyps removed   ASSESSMENT & PLAN:   46 y.o. [place] male,  without significant past medical history, presented with biopsy action.  Colonoscopy showed a ulcerative mass in the transverse colon, and multiple polyps.   1. Right colon adenocarcinoma, pT3N2bM0, stage IIIB, MSI-stable  -I previously reviewed his CT scan and surgical path in great details, including the staging, and molecular features.  -I previously reviewed the nature history of colon cancer, and risk of cancer recurrence after surgery. Due to his locally advanced disease, especially multiple (7) positive nodes, his risk of recurrence is very high.  -He has completed adjuvant chemo with CAPEOX for 6 months, and tolerated well. -He is clinically doing well, asymptomatic at this time (previously had mild fatigue and neuropathy from chemotherapy), exam was unremarkable, lab results reviewed with him, no significant clinical concern for recurrence. -He has had a mild elevated CEA since his surgery, surveillance CT scan showed no evidence of recurrence, is probably not related to his cancer. -I previously reviewed the CT scan from 08/17/2015 with the patient, which showed no evidence of recurrence   -Repeat scans February 2019. -Ordered a colonoscopy for within the next month. He is overdue.  -We will continue cancer surveillance. He will follow up with me every 4 months with lab and exam for the next 1-2 years, then every 6 months for total 5 years. I'll repeat a CT scan every 12 months (insurance denied more frequent scans) if he is clinically doing well   2. Mild peripheral neuropathy -He had transit mild peripheral neuropathy after chemotherapy, vibration sensation test was normal previously  -We'll continue monitoring. - Resolved as on 01/14/17.  3. Transaminitis -He has had mild intermittent transaminitis, likely benign, we'll continue monitoring.  Plan  - Labs reviewed, stable. No evidence of recurrence on clinical exam. - CT AP scan with contrast annually, next due in January 2019. -  Follow up in 6 months with labs and CT scan one week before  -he is overdue for colonoscopy, I will send a message to Dr. Lavon Paganini    All questions were answered. The patient knows to call the clinic with any problems, questions or concerns.  I spent 20 minutes counseling the patient face to face. The total time spent in the appointment was 25 minutes and more than 50% was on counseling.  This document serves as a record of services personally performed by Malachy Mood, MD. It was created on her behalf by Lavenia Atlas, a trained medical scribe. The creation of this record is based on the scribe's personal observations and the provider's statements to them. This document has been checked and approved by the attending provider.  I have reviewed the  above documentation for accuracy and completeness and I agree with the above.   Truitt Merle, MD 01/15/2017

## 2017-01-14 ENCOUNTER — Ambulatory Visit (HOSPITAL_BASED_OUTPATIENT_CLINIC_OR_DEPARTMENT_OTHER): Payer: Self-pay | Admitting: Hematology

## 2017-01-14 ENCOUNTER — Encounter: Payer: Self-pay | Admitting: Hematology

## 2017-01-14 ENCOUNTER — Other Ambulatory Visit (HOSPITAL_BASED_OUTPATIENT_CLINIC_OR_DEPARTMENT_OTHER): Payer: Self-pay

## 2017-01-14 VITALS — BP 130/83 | HR 69 | Temp 98.0°F | Resp 18 | Ht 68.0 in | Wt 144.6 lb

## 2017-01-14 DIAGNOSIS — G620 Drug-induced polyneuropathy: Secondary | ICD-10-CM

## 2017-01-14 DIAGNOSIS — C182 Malignant neoplasm of ascending colon: Secondary | ICD-10-CM

## 2017-01-14 DIAGNOSIS — Z720 Tobacco use: Secondary | ICD-10-CM

## 2017-01-14 DIAGNOSIS — R740 Nonspecific elevation of levels of transaminase and lactic acid dehydrogenase [LDH]: Secondary | ICD-10-CM

## 2017-01-14 LAB — COMPREHENSIVE METABOLIC PANEL
ALT: 80 U/L — ABNORMAL HIGH (ref 0–55)
ANION GAP: 11 meq/L (ref 3–11)
AST: 34 U/L (ref 5–34)
Albumin: 4.4 g/dL (ref 3.5–5.0)
Alkaline Phosphatase: 97 U/L (ref 40–150)
BUN: 6.3 mg/dL — ABNORMAL LOW (ref 7.0–26.0)
CALCIUM: 10 mg/dL (ref 8.4–10.4)
CHLORIDE: 104 meq/L (ref 98–109)
CO2: 25 mEq/L (ref 22–29)
Creatinine: 0.8 mg/dL (ref 0.7–1.3)
Glucose: 93 mg/dl (ref 70–140)
POTASSIUM: 3.6 meq/L (ref 3.5–5.1)
Sodium: 140 mEq/L (ref 136–145)
Total Bilirubin: 0.44 mg/dL (ref 0.20–1.20)
Total Protein: 8.2 g/dL (ref 6.4–8.3)

## 2017-01-14 LAB — CBC WITH DIFFERENTIAL/PLATELET
BASO%: 0.4 % (ref 0.0–2.0)
BASOS ABS: 0.1 10*3/uL (ref 0.0–0.1)
EOS%: 2.2 % (ref 0.0–7.0)
Eosinophils Absolute: 0.3 10*3/uL (ref 0.0–0.5)
HEMATOCRIT: 46.7 % (ref 38.4–49.9)
HGB: 15.9 g/dL (ref 13.0–17.1)
LYMPH#: 4.1 10*3/uL — AB (ref 0.9–3.3)
LYMPH%: 31 % (ref 14.0–49.0)
MCH: 30.4 pg (ref 27.2–33.4)
MCHC: 34 g/dL (ref 32.0–36.0)
MCV: 89.6 fL (ref 79.3–98.0)
MONO#: 0.7 10*3/uL (ref 0.1–0.9)
MONO%: 4.9 % (ref 0.0–14.0)
NEUT#: 8.2 10*3/uL — ABNORMAL HIGH (ref 1.5–6.5)
NEUT%: 61.5 % (ref 39.0–75.0)
PLATELETS: 200 10*3/uL (ref 140–400)
RBC: 5.21 10*6/uL (ref 4.20–5.82)
RDW: 13.5 % (ref 11.0–14.6)
WBC: 13.3 10*3/uL — ABNORMAL HIGH (ref 4.0–10.3)

## 2017-01-14 LAB — CEA (IN HOUSE-CHCC): CEA (CHCC-IN HOUSE): 11.92 ng/mL — AB (ref 0.00–5.00)

## 2017-01-15 ENCOUNTER — Encounter: Payer: Self-pay | Admitting: Hematology

## 2017-01-17 ENCOUNTER — Telehealth: Payer: Self-pay

## 2017-01-17 NOTE — Telephone Encounter (Signed)
Called to the patient's listed cell phone number using telephonic interpreting. Daughter answers the phone. Says she accompanies the patient to his appointments and translates for him. Appointment for the nurse visit and the procedure dates arranged. She states she will also accompany patient to the appointment.

## 2017-01-17 NOTE — Telephone Encounter (Signed)
-----   Message from Mauri Pole, MD sent at 01/16/2017  6:20 PM EDT ----- John Moon,  Please contact patient and schedule for colonoscopy, he is past due. If unable to reach patient or patient is not willing to schedule, please let me know.  Thanks VN ----- Message ----- From: Truitt Merle, MD Sent: 01/15/2017  11:55 AM To: Mauri Pole, MD  Karleen Hampshire,  He is overdue for his colonoscopy, has not had one since his colon cancer surgery in 05/2015. Could you get him in?  Thanks  Krista Blue

## 2017-01-25 ENCOUNTER — Telehealth: Payer: Self-pay | Admitting: *Deleted

## 2017-01-25 NOTE — Telephone Encounter (Signed)
John,  Will you please look at this chart. Pt had a colon at [facility] 06-10-2015 with Nandigam, he had propofol and during he had  excessive coughing, he had to be intubated for them to proceed with his colon.  He has a hx of colon cancer in the right colon, and is scheduled for a PV tomorrow, Wednesday 8-8 and a colon in the [place]  8-29 Wednesday.  Can we proceed with his colon here.  Please advise, thanks Lelan Pons PV

## 2017-01-26 ENCOUNTER — Ambulatory Visit (AMBULATORY_SURGERY_CENTER): Payer: Self-pay | Admitting: *Deleted

## 2017-01-26 VITALS — Ht 66.75 in | Wt 145.6 lb

## 2017-01-26 DIAGNOSIS — Z85038 Personal history of other malignant neoplasm of large intestine: Secondary | ICD-10-CM

## 2017-01-26 MED ORDER — NA SULFATE-K SULFATE-MG SULF 17.5-3.13-1.6 GM/177ML PO SOLN: 1.0000 | Freq: Once | ORAL | 0 refills | Status: AC

## 2017-01-26 NOTE — Progress Notes (Signed)
No egg or soy allergy known to patient  No issues with past sedation with any surgeries  or procedures, no intubation problems  No diet pills per patient No home 02 use per patient  No blood thinners per patient  Pt denies issues with constipation  No A fib or A flutter  EMMI video sent to pt's e mail - pt has no email for video   Pt states he has no insurance- has applied for medicaid but not approved yet   Samples of this drug were given to the patient, quantity suprep , Lot Number [id number] exp 05/20  Interpreter in [facility] today with patient

## 2017-01-26 NOTE — Telephone Encounter (Signed)
Per Josh Monday CRNA after reviewing pt's chart , pt is okay to proceed with colon in [place].  Lelan Pons PV

## 2017-01-27 NOTE — Telephone Encounter (Signed)
Marie,  This pt is cleared for anesthetic care at [facility].  Thanks,  Brienna Bass 

## 2017-02-16 ENCOUNTER — Ambulatory Visit (AMBULATORY_SURGERY_CENTER): Payer: Self-pay | Admitting: Gastroenterology

## 2017-02-16 ENCOUNTER — Encounter: Payer: Self-pay | Admitting: Gastroenterology

## 2017-02-16 VITALS — BP 119/82 | HR 77 | Temp 97.8°F | Resp 18 | Ht 66.75 in | Wt 145.0 lb

## 2017-02-16 DIAGNOSIS — Z85038 Personal history of other malignant neoplasm of large intestine: Secondary | ICD-10-CM

## 2017-02-16 MED ORDER — SODIUM CHLORIDE 0.9 % IV SOLN: 500.0000 mL | INTRAVENOUS | Status: DC

## 2017-02-16 NOTE — Op Note (Signed)
[facility] Patient Name: Senon Nixon Procedure Date: 02/16/2017 3:07 PM MRN: [id number] Endoscopist: Mauri Pole , MD Age: 46 Referring MD:  Date of Birth: 06/21/1971 Gender: Male Account #: [id number] Procedure:                Colonoscopy Indications:              High risk colon cancer surveillance: Personal                            history of colon cancer Medicines:                Monitored Anesthesia Care Procedure:                Pre-Anesthesia Assessment:                           - Prior to the procedure, a History and Physical                            was performed, and patient medications and                            allergies were reviewed. The patient's tolerance of                            previous anesthesia was also reviewed. The risks                            and benefits of the procedure and the sedation                            options and risks were discussed with the patient.                            All questions were answered, and informed consent                            was obtained. Prior Anticoagulants: The patient has                            taken no previous anticoagulant or antiplatelet                            agents. ASA Grade Assessment: III - A patient with                            severe systemic disease. After reviewing the risks                            and benefits, the patient was deemed in                            satisfactory condition to undergo the procedure.  After obtaining informed consent, the colonoscope                            was passed under direct vision. Throughout the                            procedure, the patient's blood pressure, pulse, and                            oxygen saturations were monitored continuously. The                            Model CF-[id number]) scope was introduced                            through the anus and advanced to the  the                            ileocolonic anastomosis. The colonoscopy was                            performed without difficulty. The patient tolerated                            the procedure well. The quality of the bowel                            preparation was excellent. the rectum and                            ileocolonic anastomosis was photographed. Scope In: 3:20:32 PM Scope Out: 3:29:51 PM Scope Withdrawal Time: 0 hours 6 minutes 28 seconds  Total Procedure Duration: 0 hours 9 minutes 19 seconds  Findings:                 The perianal and digital rectal examinations were                            normal.                           There was evidence of a prior end-to-side                            ileo-colonic anastomosis in the transverse colon.                            This was patent and was characterized by healthy                            appearing mucosa.                           The exam was otherwise without abnormality. Complications:            No immediate complications. Estimated Blood Loss:  Estimated blood loss was minimal. Impression:               - Patent end-to-side ileo-colonic anastomosis,                            characterized by healthy appearing mucosa.                           - The examination was otherwise normal.                           - No specimens collected. Recommendation:           - Patient has a contact number available for                            emergencies. The signs and symptoms of potential                            delayed complications were discussed with the                            patient. Return to normal activities tomorrow.                            Written discharge instructions were provided to the                            patient.                           - Resume previous diet.                           - Continue present medications.                           - Repeat colonoscopy in 3 years for  surveillance.                           - Return to GI clinic PRN. Mauri Pole, MD 02/16/2017 3:34:53 PM This report has been signed electronically.

## 2017-02-16 NOTE — Patient Instructions (Signed)
YOU HAD AN ENDOSCOPIC PROCEDURE TODAY AT [facility]:   Refer to the procedure report that was given to you for any specific questions about what was found during the examination.  If the procedure report does not answer your questions, please call your gastroenterologist to clarify.  If you requested that your care partner not be given the details of your procedure findings, then the procedure report has been included in a sealed envelope for you to review at your convenience later.  YOU SHOULD EXPECT: Some feelings of bloating in the abdomen. Passage of more gas than usual.  Walking can help get rid of the air that was put into your GI tract during the procedure and reduce the bloating. If you had a lower endoscopy (such as a colonoscopy or flexible sigmoidoscopy) you may notice spotting of blood in your stool or on the toilet paper. If you underwent a bowel prep for your procedure, you may not have a normal bowel movement for a few days.  Please Note:  You might notice some irritation and congestion in your nose or some drainage.  This is from the oxygen used during your procedure.  There is no need for concern and it should clear up in a day or so.  SYMPTOMS TO REPORT IMMEDIATELY:   Following lower endoscopy (colonoscopy or flexible sigmoidoscopy):  Excessive amounts of blood in the stool  Significant tenderness or worsening of abdominal pains  Swelling of the abdomen that is new, acute  Fever of 100F or higher    For urgent or emergent issues, a gastroenterologist can be reached at any hour by calling [phone].   DIET:  We do recommend a small meal at first, but then you may proceed to your regular diet.  Drink plenty of fluids but you should avoid alcoholic beverages for 24 hours.  ACTIVITY:  You should plan to take it easy for the rest of today and you should NOT DRIVE or use heavy machinery until tomorrow (because of the sedation medicines used during the test).     FOLLOW UP: Our staff will call the number listed on your records the next business day following your procedure to check on you and address any questions or concerns that you may have regarding the information given to you following your procedure. If we do not reach you, we will leave a message.  However, if you are feeling well and you are not experiencing any problems, there is no need to return our call.  We will assume that you have returned to your regular daily activities without incident.  If any biopsies were taken you will be contacted by phone or by letter within the next 1-3 weeks.  Please call us at [phone] if you have not heard about the biopsies in 3 weeks.    SIGNATURES/CONFIDENTIALITY: You and/or your care partner have signed paperwork which will be entered into your electronic medical record.  These signatures attest to the fact that that the information above on your After Visit Summary has been reviewed and is understood.  Full responsibility of the confidentiality of this discharge information lies with you and/or your care-partner.   Resume medications. Repeat procedure in 3 years.

## 2017-02-16 NOTE — Progress Notes (Signed)
First entry for recovery at 1441 entered in error.  Pt. Actually received in recovery at 1500.

## 2017-02-16 NOTE — Progress Notes (Signed)
Pt's states no medical or surgical changes since previsit or office visit. 

## 2017-02-16 NOTE — Progress Notes (Signed)
To recovery, report to RN, VSS. 

## 2017-02-17 ENCOUNTER — Telehealth: Payer: Self-pay

## 2017-02-17 ENCOUNTER — Telehealth: Payer: Self-pay | Admitting: *Deleted

## 2017-02-17 NOTE — Telephone Encounter (Signed)
Called [phone] and left a messaged we tried to reach pt for a follow up call. maw

## 2017-02-17 NOTE — Telephone Encounter (Signed)
  Follow up Call-  Call back number 02/16/2017  Post procedure Call Back phone  # [phone]  Permission to leave phone message Yes  Some recent data might be hidden    Spoke with daughter Patient questions:  Do you have a fever, pain , or abdominal swelling? No. Pain Score  0 *  Have you tolerated food without any problems? Yes.    Have you been able to return to your normal activities? Yes.    Do you have any questions about your discharge instructions: Diet   No. Medications  No. Follow up visit  No.  Do you have questions or concerns about your Care? No.  Actions: * If pain score is 4 or above: No action needed, pain <4.

## 2017-07-13 ENCOUNTER — Other Ambulatory Visit: Payer: Self-pay

## 2017-07-20 ENCOUNTER — Ambulatory Visit: Payer: Self-pay | Admitting: Hematology

## 2017-08-02 ENCOUNTER — Telehealth: Payer: Self-pay | Admitting: Hematology

## 2017-08-02 NOTE — Telephone Encounter (Signed)
Patients daughter r/s appointments due to patient being out of town.

## 2017-08-12 ENCOUNTER — Ambulatory Visit (HOSPITAL_COMMUNITY): Payer: Self-pay

## 2017-08-12 ENCOUNTER — Other Ambulatory Visit: Payer: Self-pay

## 2017-08-19 ENCOUNTER — Ambulatory Visit: Payer: Self-pay | Admitting: Hematology

## 2017-09-01 ENCOUNTER — Other Ambulatory Visit: Payer: Self-pay

## 2017-09-01 ENCOUNTER — Ambulatory Visit (HOSPITAL_COMMUNITY): Admission: RE | Admit: 2017-09-01 | Payer: Self-pay | Source: Ambulatory Visit

## 2017-09-02 ENCOUNTER — Telehealth: Payer: Self-pay | Admitting: *Deleted

## 2017-09-02 NOTE — Telephone Encounter (Signed)
Spoke with pt earlier, and inquired about pt cancelled CT scans appt for 09/01/17.  Pt stated he  Was not aware of the appt - stated his daughter made the appt , and not telling pt.  Gave pt radiology scheduling phone number to reschedule CT scans soon.  Informed pt that Dr. Burr Medico will see pt after scans.  Pt voiced understanding. Attempted to call pt back again to cancel f/u appt for 3/18.  No answer, and no voice mail available. Called daughter Marylin Crosby and left message on voice mail informing her that pt will not need to come in for visit on Mon 09/05/17.   Left message that pt will be seen after CT scans.

## 2017-09-05 ENCOUNTER — Ambulatory Visit: Payer: Self-pay | Admitting: Hematology

## 2017-09-07 NOTE — Progress Notes (Signed)
[place]  [phone] [phone]  Clinic follow Up Note   Patient Care Team: Patient, No Pcp Per as PCP - General (General Practice) 09/09/2017   CHIEF COMPLAINTS:  Follow up stage III right colon cancer  Oncology History   Cancer of right colon [facility])   Staging form: Colon and Rectum, AJCC 7th Edition     Pathologic stage from 06/12/2015: Stage IIIC (T3, N2b, cM0) - Signed by Truitt Merle, MD on 07/07/2015       Cancer of right colon ([place])   06/09/2015 Imaging    CT chest abdomen and pelvis showed proximal and a transverse colitis, fluid-filled and dilated transverse colon indicating of obstruction related to a mass at the junction of the transverse and descending colon. trace left pleural effusion.       06/10/2015 Initial Diagnosis    Cancer of right colon ([place])      06/10/2015 Procedure    Colonoscopy showed a friable ulcerated mucosa in distal to mid transverse with stricture, scope couldn't be advanced beyond the. 11 sessile polyps removed.      06/12/2015 Surgery    Right hemicolectomy and lymph node dissection.      06/12/2015 Pathology Results    Moderately differentiated colonic adenocarcinoma, 3 cm, G2, located in the transverse colon, LVI(-), perineural invasion (-), T3, margins were negative, 7 out of 30 lymph nodes were positive, polyps were tubular adenoma.      07/31/2015 - 01/06/2016 Chemotherapy    adjuvant chemo CAPEOX, oxaliplatin 130 mg/m, Xeloda 1000 mg/m on day 1-14, every 21 days, S/P 8 cycles       08/16/2016 Imaging    CT CAP w/ CONTRAST 08/16/16 IMPRESSION: 1. No evidence metastatic disease in the chest, abdomen or pelvis. 2. Subpleural nodularity at both lung bases is stable from 2016, probably subpleural lymph nodes. 3. Mild hepatic steatosis. 4. Bilateral L5 pars defects.      02/16/2017 Procedure    Colonoscopy with Dr. Silverio Decamp on 02/16/17 IMPRESSION - Patent end-to-side ileo-colonic anastomosis,  characterized by healthy appearing mucosa. - The examination was otherwise normal. - No specimens collected.      09/08/2017 Imaging    CT CAP W Contrast 09/08/17 IMPRESSION: 1. There is a new 0.8 cm subcapsular enhancing lesion within the right hepatic lobe which is indeterminate on current exam. While this may represent a benign lesion, recommend further evaluation with abdominal MRI for definitive characterization. 2. Transient hepatic perfusion within the left hepatic lobe which is likely benign in etiology. Recommend attention on abdominal MRI. 3. New 2 mm right lower lobe nodule. Additional nodules are stable. Recommend attention on follow-up. 4. Hepatic steatosis.       HISTORY OF PRESENTING ILLNESS:  John Moon 47 y.o. male is here because of His recently diagnosed stage III colon cancer. He is accompanied by his wife, half-brother, son, and mother to the clinic today.   He presented to [facility] emergency room with sudden onset abdominal pain and nausea for 2 days on 06/09/2015. CT scan reviewed follow-up suction and I palpable mass at the junction of the transverse and descending colon. He was admitted to the hospital, underwent colonoscopy on 06/10/2015, which showed a friable ulcerated mass in distal to mid transverse colon with structure, scope was not able to advance beyond that. The biopsy of the colon mass showed adenocarcinoma. He also had at least 11 since I polyps removed it he underwent right hemicolectomy and lymph node dissection by Dr.  Toth on 06/12/2015. He tolerated the surgery very well, was discharged home on 06/17/2015.   He has recovered well from his surgery, denies any pain, nausea, change of his bowel habits. He has good appetite and energy level. No other complaints. 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   CURRENT THERAPY:  Cancer surveillance  INTERIM HISTORY: John Moon returns  for follow up. He presents to the clinic today by himself with an interpretor. He reports he is doing well. He endorses a good appetite and regular bowel movements. He states he is back to work doing [employer] and he is working 12 hours a day.   On review of systems, pt denies pain, peripheral neuropathy or any other complaints at this time. Pertinent positives are listed and detailed within the above HPI.    MEDICAL HISTORY:  Past Medical History:  Diagnosis Date  . Colon cancer ([place])    colon cancer chemo only    SURGICAL HISTORY: Past Surgical History:  Procedure Laterality Date  . COLONOSCOPY    . COLONOSCOPY WITH PROPOFOL N/A 06/10/2015   Procedure: COLONOSCOPY WITH PROPOFOL;  Surgeon: Mauri Pole, MD;  Location: [facility] ENDOSCOPY;  Service: Endoscopy;  Laterality: N/A;  . LAPAROSCOPIC PARTIAL COLECTOMY N/A 06/12/2015   Procedure: LAPAROSCOPIC ASSISTED  PARTIAL COLECTOMY;  Surgeon: Autumn Messing III, MD;  Location: [facility] ORS;  Service: General;  Laterality: N/A;  . PARTIAL COLECTOMY  06/12/2015   Procedure: PARTIAL COLECTOMY;  Surgeon: Autumn Messing III, MD;  Location: [facility] ORS;  Service: General;;    SOCIAL HISTORY: Social History   Social History  . Marital Status: Married     Spouse Name: N/A  . Number of Children: 2  . Years of Education: N/A   Occupational History  . Not on file.   Social History Main Topics  . Smoking status: Current Every Day Smoker -- 1.00 packs/day    Types: Cigarettes  . Smokeless tobacco: Never Used  . Alcohol Use: 4.2 oz/week    2 Standard drinks or equivalent, 5 Cans of beer per week  . Drug Use: No  . Sexual Activity: No   Other Topics Concern  . 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 Colon cancer Neg Hx   . Colon polyps Neg Hx   . Esophageal cancer Neg Hx   . Rectal cancer Neg Hx   . Stomach cancer Neg Hx    no family history of colon cancer or other malignancy.   ALLERGIES:  has No Known Allergies.  MEDICATIONS:  Current Outpatient Medications  Medication Sig Dispense Refill  . oxyCODONE-acetaminophen (ROXICET) 5-325 MG tablet Take 1-2 tablets by mouth every 4 (four) hours as needed for severe pain. (Patient not taking: Reported on 02/16/2017) 30 tablet 0   Current Facility-Administered Medications  Medication Dose Route Frequency Provider Last Rate Last Dose  . 0.9 %  sodium chloride infusion  500 mL Intravenous Continuous Nandigam, Kavitha V, MD        REVIEW OF SYSTEMS:  Constitutional: Denies fevers, chills or abnormal night sweats, (+) good appetite Eyes: Denies blurriness  of vision, double vision or watery eyes Ears, nose, mouth, throat, and face: Denies mucositis or sore throat Respiratory: Denies cough, dyspnea or wheezes Cardiovascular: Denies palpitation, chest discomfort or lower extremity swelling Gastrointestinal:  Denies nausea, heartburn or change in bowel habits Skin: Denies abnormal skin rashes Lymphatics: Denies new lymphadenopathy or easy bruising Neurological:Denies numbness, tingling or new weaknesses Behavioral/Psych: Mood is stable, no new changes  All other systems were reviewed with the patient and are negative.  PHYSICAL EXAMINATION: ECOG PERFORMANCE STATUS: 0  BP 130/85 (BP Location: Left Arm, Patient Position: Sitting)   Pulse 83   Temp 98.5 F (36.9 C) (Oral)   Resp 18   Ht 5' 6.75" (1.695 m)   Wt 144 lb 1.6 oz (65.4 kg)   SpO2 97%   BMI 22.74 kg/m    GENERAL:alert, no distress and comfortable SKIN: skin color, texture, turgor are normal, no rashes or significant lesions EYES: normal, conjunctiva are pink and non-injected, sclera clear OROPHARYNX:no exudate, no erythema and lips, buccal mucosa, and tongue normal    NECK: supple, thyroid normal size, non-tender, without nodularity LYMPH:  no palpable lymphadenopathy in the cervical, axillary or inguinal LUNGS: clear to auscultation and percussion with normal breathing effort HEART: regular rate & rhythm and no murmurs and no lower extremity edema ABDOMEN:abdomen soft, non-tender and normal bowel sounds, surgical incision sites are well healed. Musculoskeletal:no cyanosis of digits and no clubbing  PSYCH: alert & oriented x 3 with fluent speech NEURO: no focal motor/sensory deficits  LABORATORY DATA:  I have reviewed the data as listed CBC Latest Ref Rng & Units 09/09/2017 01/14/2017 08/13/2016  WBC 4.0 - 10.3 K/uL 9.6 13.3(H) 8.6  Hemoglobin 13.0 - 17.1 g/dL 14.7 15.9 14.5  Hematocrit 38.4 - 49.9 % 43.9 46.7 43.2  Platelets 140 - 400 K/uL 265 200 184   CMP Latest Ref Rng & Units 09/09/2017 01/14/2017 08/13/2016  Glucose 70 - 140 mg/dL 142(H) 93 101  BUN 7 - 26 mg/dL 7 6.3(L) 10.7  Creatinine 0.70 - 1.30 mg/dL 0.93 0.8 0.8  Sodium 136 - 145 mmol/L 139 140 140  Potassium 3.5 - 5.1 mmol/L 3.5 3.6 4.2  Chloride 98 - 109 mmol/L 107 - -  CO2 22 - 29 mmol/L '22 25 26  '$ Calcium 8.4 - 10.4 mg/dL 9.3 10.0 9.9  Total Protein 6.4 - 8.3 g/dL 7.8 8.2 7.9  Total Bilirubin 0.2 - 1.2 mg/dL 0.3 0.44 0.25  Alkaline Phos 40 - 150 U/L 92 97 81  AST 5 - 34 U/L 26 34 49(H)  ALT 0 - 55 U/L 69(H) 80(H) 77(H)   CEA: 06/11/2015: 26.3 08/11/2015: 5.6 10/29/2015: 5.8 04/06/2016: 8.3 08/13/2016: 6.5 01/14/17: 11.92 09/09/17: PENDING  PROCEDURE  Colonoscopy with Dr. Silverio Decamp on 02/16/17 IMPRESSION - Patent end-to-side ileo-colonic anastomosis, characterized by healthy appearing mucosa. - The examination was otherwise normal. - No specimens collected.  Pathology report Diagnosis 06/12/2015 Colon, segmental resection for tumor, transverse and right colon MODERATELY DIFFERENTIATED COLONIC ADENOCARCINOMA (3 CM) THE TUMOR INVADE THROUGH THE MUSCULARIS PROPRIA INTO  PERICOLONIC TISSUE (T3) ALL MARGINS OF RESECTION ARE NEGATIVE FOR TUMOR METASTATIC COLONIC ADENOCARCINOMA IN SEVEN OF THIRTY LYMPH NODES (7/30, N2B) TUBULAR ADENOMA (X1) TUBULAR ADENOMA INVOLVING APPENDIX Microscopic Comment COLON AND RECTUM (INCLUDING TRANS-ANAL RESECTION): Specimen: Right and transverse colon Procedure: segmental resection Tumor site: Transverse colon Specimen integrity: Intact Macroscopic intactness of mesorectum: Not applicable: _ Macroscopic tumor perforation: Invading through the muscularis propria into pericolonic soft tissue Invasive tumor: Maximum size: 3 cm Histologic type(s):  Adenocarcinoma Histologic grade and differentiation: G2 G1: well differentiated/low grade G2: moderately differentiated/low grade G3: poorly differentiated/high grade G4: undifferentiated/high grade Type of polyp in which invasive carcinoma arose: Tubular adenoma Microscopic extension of invasive tumor: Invading through the muscularis propria into pericolonic soft tissue Lymph-Vascular invasion: Negative Peri-neural invasion: Negative 1 of 3 FINAL for Rens, Charles V ([id number]) Microscopic Comment(continued) Tumor deposit(s) (discontinuous extramural extension): Negative Resection margins: Proximal margin: negative Distal margin: negative Circumferential (radial) (posterior ascending, posterior descending; lateral and posterior mid-rectum; and entire lower 1/3 rectum): negative Mesenteric margin (sigmoid and transverse): negative Distance closest margin (if all above margins negative): 3.5 cm Treatment effect (neo-adjuvant therapy): Negative Additional polyp(s): Tubular adenoma Non-neoplastic findings: Unremarkable Lymph nodes: number examined 30; number positive: 7 Pathologic Staging: T3, N2b, M_ Ancillary studies: Microsatelite instability PCR ordered Casimer Lanius MD Pathologist, Electronic Signature (Case signed 06/17/2015)      RADIOGRAPHIC STUDIES: I have  personally reviewed the radiological images as listed and agreed with the findings in the report.  CT CAP W Contrast 09/08/17 IMPRESSION: 1. There is a new 0.8 cm subcapsular enhancing lesion within the right hepatic lobe which is indeterminate on current exam. While this may represent a benign lesion, recommend further evaluation with abdominal MRI for definitive characterization. 2. Transient hepatic perfusion within the left hepatic lobe which is likely benign in etiology. Recommend attention on abdominal MRI. 3. New 2 mm right lower lobe nodule. Additional nodules are stable. Recommend attention on follow-up. 4. Hepatic steatosis.  CT CAP w contrast 08/16/2016 IMPRESSION: 1. No evidence metastatic disease in the chest, abdomen or pelvis. 2. Subpleural nodularity at both lung bases is stable from 2016, probably subpleural lymph nodes. 3. Mild hepatic steatosis. 4. Bilateral L5 pars defects.  ENDOSCOPIC IMPRESSION: 06/10/2015 Friable ulcerated mucosa in distal to mid transverse with stricture, scope couldn't be advanced beyond it, ultiple biopsies taken from the edge of the mass. 11 sessile polyps removed   ASSESSMENT & PLAN:   47 y.o. [place] male, without significant past medical history, presented with biopsy action.  Colonoscopy showed a ulcerative mass in the transverse colon, and multiple polyps.   1. Right colon adenocarcinoma, pT3N2bM0, stage IIIB, MSI-stable  -I previously reviewed his CT scan and surgical path in great details, including the staging, and molecular features.  -I previously reviewed the nature history of colon cancer, and risk of cancer recurrence after surgery. Due to his locally advanced disease, especially multiple (7) positive nodes, his risk of recurrence is very high.  -He has completed adjuvant chemo with CAPEOX for 6 months, and tolerated well. -I previously reviewed the CT scan from 08/17/2015 with the patient, which showed no evidence of  recurrence   -Colonoscopy from 02/15/17 was normal, no samples taken -CT CAP W Contrast from 09/08/17 revealed a new 0.8 cm lesion in the liver that is indeterminate. Abdominal MRI is recommended to rule out metastatic colon cancer to liver. I discussed the results and reviewed the scan with the pt in person. He voiced good understanding. He is agreeable to have a MRI.  -He is clinically doing well, exam was unremarkable, lab results reviewed with him, they otherwise normal with the exception of mildly elevated ALT at 69.  -He has had a mild elevated CEA since his surgery, his previously surveillance CT scan showed no evidence of recurrence, yesterday's CEA pending today. Will get a abdominal MRI in 2 weeks  -I will call and f/u with him after scan, if negative, will see him back in  6 months for f/u   2.  Hepatic steatosis and transaminitis -He previously had mild intermittent transaminitis, likely benign, we'll continue monitoring.  CT scan shows steatosis -ALT elevated today (09/09/17) at 69. Overall stable and mild   Plan  -Abdominal MRI in 2 weeks, I will call with results -Lab and f/u in 6 months if scan is negative for metastasis   All questions were answered. The patient knows to call the clinic with any problems, questions or concerns.  I spent 20 minutes counseling the patient face to face. The total time spent in the appointment was 25 minutes and more than 50% was on counseling.  This document serves as a record of services personally performed by Truitt Merle, MD. It was created on her behalf by Theresia Bough, a trained medical scribe. The creation of this record is based on the scribe's personal observations and the provider's statements to them.   I have reviewed the above documentation for accuracy and completeness, and I agree with the above.    Truitt Merle, MD 09/09/2017 5:28 PM

## 2017-09-08 ENCOUNTER — Ambulatory Visit (HOSPITAL_COMMUNITY)
Admission: RE | Admit: 2017-09-08 | Discharge: 2017-09-08 | Disposition: A | Discharge status: Home / Self Care | Payer: Self-pay | Source: Ambulatory Visit | Attending: Hematology | Admitting: Hematology

## 2017-09-08 DIAGNOSIS — R918 Other nonspecific abnormal finding of lung field: Secondary | ICD-10-CM | POA: Insufficient documentation

## 2017-09-08 DIAGNOSIS — K760 Fatty (change of) liver, not elsewhere classified: Secondary | ICD-10-CM | POA: Insufficient documentation

## 2017-09-08 DIAGNOSIS — C182 Malignant neoplasm of ascending colon: Secondary | ICD-10-CM

## 2017-09-08 DIAGNOSIS — Z85038 Personal history of other malignant neoplasm of large intestine: Secondary | ICD-10-CM | POA: Insufficient documentation

## 2017-09-08 DIAGNOSIS — Z08 Encounter for follow-up examination after completed treatment for malignant neoplasm: Secondary | ICD-10-CM | POA: Insufficient documentation

## 2017-09-08 MED ORDER — IOPAMIDOL (ISOVUE-300) INJECTION 61%
100.0000 mL | Freq: Once | INTRAVENOUS | Status: AC | PRN
  Administered 2017-09-08: 100 mL via INTRAVENOUS

## 2017-09-08 MED ORDER — IOPAMIDOL (ISOVUE-300) INJECTION 61%
INTRAVENOUS | Status: AC
  Filled 2017-09-08: qty 100

## 2017-09-09 ENCOUNTER — Encounter: Payer: Self-pay | Admitting: Hematology

## 2017-09-09 ENCOUNTER — Inpatient Hospital Stay: Payer: Self-pay | Attending: Hematology | Admitting: Hematology

## 2017-09-09 ENCOUNTER — Inpatient Hospital Stay: Payer: Self-pay

## 2017-09-09 ENCOUNTER — Telehealth: Payer: Self-pay

## 2017-09-09 VITALS — BP 130/85 | HR 83 | Temp 98.5°F | Resp 18 | Ht 66.75 in | Wt 144.1 lb

## 2017-09-09 DIAGNOSIS — Z9221 Personal history of antineoplastic chemotherapy: Secondary | ICD-10-CM | POA: Insufficient documentation

## 2017-09-09 DIAGNOSIS — Z86010 Personal history of colonic polyps: Secondary | ICD-10-CM | POA: Insufficient documentation

## 2017-09-09 DIAGNOSIS — F17210 Nicotine dependence, cigarettes, uncomplicated: Secondary | ICD-10-CM | POA: Insufficient documentation

## 2017-09-09 DIAGNOSIS — C182 Malignant neoplasm of ascending colon: Secondary | ICD-10-CM | POA: Insufficient documentation

## 2017-09-09 DIAGNOSIS — R110 Nausea: Secondary | ICD-10-CM | POA: Insufficient documentation

## 2017-09-09 DIAGNOSIS — R918 Other nonspecific abnormal finding of lung field: Secondary | ICD-10-CM | POA: Insufficient documentation

## 2017-09-09 DIAGNOSIS — K760 Fatty (change of) liver, not elsewhere classified: Secondary | ICD-10-CM | POA: Insufficient documentation

## 2017-09-09 DIAGNOSIS — R109 Unspecified abdominal pain: Secondary | ICD-10-CM | POA: Insufficient documentation

## 2017-09-09 LAB — COMPREHENSIVE METABOLIC PANEL
ALT: 69 U/L — ABNORMAL HIGH (ref 0–55)
ANION GAP: 10 (ref 3–11)
AST: 26 U/L (ref 5–34)
Albumin: 3.8 g/dL (ref 3.5–5.0)
Alkaline Phosphatase: 92 U/L (ref 40–150)
BUN: 7 mg/dL (ref 7–26)
CO2: 22 mmol/L (ref 22–29)
Calcium: 9.3 mg/dL (ref 8.4–10.4)
Chloride: 107 mmol/L (ref 98–109)
Creatinine, Ser: 0.93 mg/dL (ref 0.70–1.30)
GFR calc Af Amer: >60 mL/min (ref >60)
Glucose, Bld: 142 mg/dL — ABNORMAL HIGH (ref 70–140)
POTASSIUM: 3.5 mmol/L (ref 3.5–5.1)
Sodium: 139 mmol/L (ref 136–145)
Total Bilirubin: 0.3 mg/dL (ref 0.2–1.2)
Total Protein: 7.8 g/dL (ref 6.4–8.3)

## 2017-09-09 LAB — CBC WITH DIFFERENTIAL/PLATELET
BASOS ABS: 0.1 10*3/uL (ref 0.0–0.1)
BASOS PCT: 1 %
Eosinophils Absolute: 0.2 10*3/uL (ref 0.0–0.5)
Eosinophils Relative: 2 %
HEMATOCRIT: 43.9 % (ref 38.4–49.9)
Hemoglobin: 14.7 g/dL (ref 13.0–17.1)
LYMPHS PCT: 27 %
Lymphs Abs: 2.6 10*3/uL (ref 0.9–3.3)
MCH: 30.5 pg (ref 27.2–33.4)
MCHC: 33.5 g/dL (ref 32.0–36.0)
MCV: 90.9 fL (ref 79.3–98.0)
Monocytes Absolute: 0.5 10*3/uL (ref 0.1–0.9)
Monocytes Relative: 5 %
NEUTROS ABS: 6.3 10*3/uL (ref 1.5–6.5)
Neutrophils Relative %: 65 %
Platelets: 265 10*3/uL (ref 140–400)
RBC: 4.83 MIL/uL (ref 4.20–5.82)
RDW: 13.1 % (ref 11.0–14.6)
WBC: 9.6 10*3/uL (ref 4.0–10.3)

## 2017-09-09 LAB — CEA (IN HOUSE-CHCC): CEA (CHCC-IN HOUSE): 6.1 ng/mL — AB (ref 0.00–5.00)

## 2017-09-09 NOTE — Telephone Encounter (Signed)
Printed avs and calender of upcoming appointment. Per 3/22 los

## 2017-09-14 ENCOUNTER — Other Ambulatory Visit: Payer: Self-pay | Admitting: Hematology

## 2017-09-14 DIAGNOSIS — C182 Malignant neoplasm of ascending colon: Secondary | ICD-10-CM

## 2017-11-12 ENCOUNTER — Encounter (HOSPITAL_COMMUNITY): Payer: Self-pay | Admitting: Hematology

## 2018-03-03 ENCOUNTER — Telehealth: Payer: Self-pay | Admitting: Hematology

## 2018-03-03 NOTE — Telephone Encounter (Signed)
YF PAL 9/20 - moved appointments to 10/14. Left message for patient via [place] [phone]. Schedule mailed. Message left at cell number - home voicemail not set up.

## 2018-03-10 ENCOUNTER — Other Ambulatory Visit: Payer: Self-pay

## 2018-03-10 ENCOUNTER — Ambulatory Visit: Payer: Self-pay | Admitting: Hematology

## 2018-04-03 ENCOUNTER — Inpatient Hospital Stay: Payer: Self-pay | Attending: Hematology

## 2018-04-03 ENCOUNTER — Telehealth: Payer: Self-pay

## 2018-04-03 ENCOUNTER — Inpatient Hospital Stay (HOSPITAL_BASED_OUTPATIENT_CLINIC_OR_DEPARTMENT_OTHER): Payer: Self-pay | Admitting: Hematology

## 2018-04-03 ENCOUNTER — Encounter: Payer: Self-pay | Admitting: Hematology

## 2018-04-03 VITALS — BP 130/86 | HR 70 | Temp 99.0°F | Resp 20 | Ht 66.0 in | Wt 142.4 lb

## 2018-04-03 DIAGNOSIS — Z9221 Personal history of antineoplastic chemotherapy: Secondary | ICD-10-CM

## 2018-04-03 DIAGNOSIS — Z86010 Personal history of colonic polyps: Secondary | ICD-10-CM | POA: Insufficient documentation

## 2018-04-03 DIAGNOSIS — K760 Fatty (change of) liver, not elsewhere classified: Secondary | ICD-10-CM | POA: Insufficient documentation

## 2018-04-03 DIAGNOSIS — F17210 Nicotine dependence, cigarettes, uncomplicated: Secondary | ICD-10-CM | POA: Insufficient documentation

## 2018-04-03 DIAGNOSIS — R911 Solitary pulmonary nodule: Secondary | ICD-10-CM

## 2018-04-03 DIAGNOSIS — C182 Malignant neoplasm of ascending colon: Secondary | ICD-10-CM

## 2018-04-03 DIAGNOSIS — Z9049 Acquired absence of other specified parts of digestive tract: Secondary | ICD-10-CM

## 2018-04-03 LAB — COMPREHENSIVE METABOLIC PANEL
ALT: 37 U/L (ref 0–44)
AST: 18 U/L (ref 15–41)
Albumin: 4.3 g/dL (ref 3.5–5.0)
Alkaline Phosphatase: 88 U/L (ref 38–126)
Anion gap: 10 (ref 5–15)
BILIRUBIN TOTAL: 0.4 mg/dL (ref 0.3–1.2)
BUN: 10 mg/dL (ref 6–20)
CALCIUM: 9.6 mg/dL (ref 8.9–10.3)
CO2: 23 mmol/L (ref 22–32)
CREATININE: 0.8 mg/dL (ref 0.61–1.24)
Chloride: 108 mmol/L (ref 98–111)
Glucose, Bld: 91 mg/dL (ref 70–99)
Potassium: 4 mmol/L (ref 3.5–5.1)
Sodium: 141 mmol/L (ref 135–145)
TOTAL PROTEIN: 7.9 g/dL (ref 6.5–8.1)

## 2018-04-03 LAB — CBC WITH DIFFERENTIAL/PLATELET
Abs Immature Granulocytes: 0.04 10*3/uL (ref 0.00–0.07)
BASOS ABS: 0.1 10*3/uL (ref 0.0–0.1)
BASOS PCT: 1 %
EOS ABS: 0.2 10*3/uL (ref 0.0–0.5)
EOS PCT: 2 %
HCT: 43.2 % (ref 39.0–52.0)
Hemoglobin: 14.8 g/dL (ref 13.0–17.0)
Immature Granulocytes: 0 %
LYMPHS PCT: 35 %
Lymphs Abs: 3.5 10*3/uL (ref 0.7–4.0)
MCH: 30.6 pg (ref 26.0–34.0)
MCHC: 34.3 g/dL (ref 30.0–36.0)
MCV: 89.4 fL (ref 80.0–100.0)
Monocytes Absolute: 0.6 10*3/uL (ref 0.1–1.0)
Monocytes Relative: 6 %
NRBC: 0 % (ref 0.0–0.2)
Neutro Abs: 5.7 10*3/uL (ref 1.7–7.7)
Neutrophils Relative %: 56 %
PLATELETS: 203 10*3/uL (ref 150–400)
RBC: 4.83 MIL/uL (ref 4.22–5.81)
RDW: 12.7 % (ref 11.5–15.5)
WBC: 10 10*3/uL (ref 4.0–10.5)

## 2018-04-03 NOTE — Telephone Encounter (Signed)
Printed avs and calender of upcoming appointment. Per 10/14 los 

## 2018-04-03 NOTE — Progress Notes (Signed)
[place]  [phone] [phone]  Clinic follow Up Note   Patient Care Team: Patient, No Pcp Per as PCP - General (General Practice)   Date of Service:  04/03/2018  CHIEF COMPLAINTS:  Follow up stage III right colon cancer  Oncology History   Cancer of right colon [facility])   Staging form: Colon and Rectum, AJCC 7th Edition     Pathologic stage from 06/12/2015: Stage IIIC (T3, N2b, cM0) - Signed by Truitt Merle, MD on 07/07/2015       Cancer of right colon ([place])   06/09/2015 Imaging    CT chest abdomen and pelvis showed proximal and a transverse colitis, fluid-filled and dilated transverse colon indicating of obstruction related to a mass at the junction of the transverse and descending colon. trace left pleural effusion.     06/10/2015 Initial Diagnosis    Cancer of right colon ([place])    06/10/2015 Procedure    Colonoscopy showed a friable ulcerated mucosa in distal to mid transverse with stricture, scope couldn't be advanced beyond the. 11 sessile polyps removed.    06/12/2015 Surgery    Right hemicolectomy and lymph node dissection.    06/12/2015 Pathology Results    Moderately differentiated colonic adenocarcinoma, 3 cm, G2, located in the transverse colon, LVI(-), perineural invasion (-), T3, margins were negative, 7 out of 30 lymph nodes were positive, polyps were tubular adenoma.    07/31/2015 - 01/06/2016 Chemotherapy    adjuvant chemo CAPEOX, oxaliplatin 130 mg/m, Xeloda 1000 mg/m on day 1-14, every 21 days, S/P 8 cycles     08/16/2016 Imaging    CT CAP w/ CONTRAST 08/16/16 IMPRESSION: 1. No evidence metastatic disease in the chest, abdomen or pelvis. 2. Subpleural nodularity at both lung bases is stable from 2016, probably subpleural lymph nodes. 3. Mild hepatic steatosis. 4. Bilateral L5 pars defects.    02/16/2017 Procedure    Colonoscopy with Dr. Silverio Decamp on 02/16/17 IMPRESSION - Patent end-to-side ileo-colonic anastomosis,  characterized by healthy appearing mucosa. - The examination was otherwise normal. - No specimens collected.    09/08/2017 Imaging    CT CAP W Contrast 09/08/17 IMPRESSION: 1. There is a new 0.8 cm subcapsular enhancing lesion within the right hepatic lobe which is indeterminate on current exam. While this may represent a benign lesion, recommend further evaluation with abdominal MRI for definitive characterization. 2. Transient hepatic perfusion within the left hepatic lobe which is likely benign in etiology. Recommend attention on abdominal MRI. 3. New 2 mm right lower lobe nodule. Additional nodules are stable. Recommend attention on follow-up. 4. Hepatic steatosis.     HISTORY OF PRESENTING ILLNESS:  John Moon 47 y.o. male is here because of His recently diagnosed stage III colon cancer. He is accompanied by his wife, half-brother, son, and mother to the clinic today.   He presented to [facility] emergency room with sudden onset abdominal pain and nausea for 2 days on 06/09/2015. CT scan reviewed follow-up suction and I palpable mass at the junction of the transverse and descending colon. He was admitted to the hospital, underwent colonoscopy on 06/10/2015, which showed a friable ulcerated mass in distal to mid transverse colon with structure, scope was not able to advance beyond that. The biopsy of the colon mass showed adenocarcinoma. He also had at least 11 since I polyps removed it he underwent right hemicolectomy and lymph node dissection by Dr. Marlou Starks on 06/12/2015. He tolerated the surgery very well, was discharged home on  06/17/2015.   He has recovered well from his surgery, denies any pain, nausea, change of his bowel habits. He has good appetite and energy level. No other complaints. 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   CURRENT THERAPY:  Cancer surveillance  INTERIM HISTORY:  ANTON CHERAMIE returns for  follow up of his right colon cancer. He was last seen by me 7 months ago. He presents to the clinic today with an interpretor. He is doing well and denies new symptoms. He denies blood in stool or black stool. He denies numbness or tingling. He states that his knees hurt him when he sits or lays for a long time.   He is back to work, and works in a IT sales professional.  MEDICAL HISTORY:  Past Medical History:  Diagnosis Date  . Colon cancer ([place])    colon cancer chemo only    SURGICAL HISTORY: Past Surgical History:  Procedure Laterality Date  . COLONOSCOPY    . COLONOSCOPY WITH PROPOFOL N/A 06/10/2015   Procedure: COLONOSCOPY WITH PROPOFOL;  Surgeon: Mauri Pole, MD;  Location: [facility] ENDOSCOPY;  Service: Endoscopy;  Laterality: N/A;  . LAPAROSCOPIC PARTIAL COLECTOMY N/A 06/12/2015   Procedure: LAPAROSCOPIC ASSISTED  PARTIAL COLECTOMY;  Surgeon: Autumn Messing III, MD;  Location: [facility] ORS;  Service: General;  Laterality: N/A;  . PARTIAL COLECTOMY  06/12/2015   Procedure: PARTIAL COLECTOMY;  Surgeon: Autumn Messing III, MD;  Location: [facility] ORS;  Service: General;;    SOCIAL HISTORY: Social History   Social History  . Marital Status: Married     Spouse Name: N/A  . Number of Children: 2  . Years of Education: N/A   Occupational History  . Not on file.   Social History Main Topics  . Smoking status: Current Every Day Smoker -- 1.00 packs/day    Types: Cigarettes  . Smokeless tobacco: Never Used  . Alcohol Use: 4.2 oz/week    2 Standard drinks or equivalent, 5 Cans of beer per week  . Drug Use: No  . Sexual Activity: No   Other Topics Concern  . 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 Colon cancer Neg Hx   . Colon polyps  Neg Hx   . Esophageal cancer Neg Hx   . Rectal cancer Neg Hx   . Stomach cancer Neg Hx    no family history of colon cancer or other malignancy.   ALLERGIES:  has No Known Allergies.  MEDICATIONS:  Current Outpatient Medications  Medication Sig Dispense Refill  . oxyCODONE-acetaminophen (ROXICET) 5-325 MG tablet Take 1-2 tablets by mouth every 4 (four) hours as needed for severe pain. (Patient not taking: Reported on 02/16/2017) 30 tablet 0   Current Facility-Administered Medications  Medication Dose Route Frequency Provider Last Rate Last Dose  . 0.9 %  sodium chloride infusion  500 mL Intravenous Continuous Nandigam, Kavitha V, MD        REVIEW OF SYSTEMS:  Constitutional: Denies fevers, chills or abnormal night sweats Eyes: Denies blurriness of vision, double vision or watery eyes Ears, nose, mouth, throat, and face: Denies mucositis  or sore throat Respiratory: Denies cough, dyspnea or wheezes Cardiovascular: Denies palpitation, chest discomfort or lower extremity swelling Gastrointestinal:  Denies nausea, heartburn or change in bowel habits Skin: Denies abnormal skin rashes Lymphatics: Denies new lymphadenopathy or easy bruising Neurological:Denies numbness, tingling or new weaknesses MSK: (+) knee pain Behavioral/Psych: Mood is stable, no new changes  All other systems were reviewed with the patient and are negative.  PHYSICAL EXAMINATION: ECOG PERFORMANCE STATUS: 0  BP 130/86 (BP Location: Left Arm, Patient Position: Sitting)   Pulse 70   Temp 99 F (37.2 C) (Oral)   Resp 20   Ht _0  (1.676 m)   Wt 142 lb 6.4 oz (64.6 kg)   SpO2 100%   BMI 22.98 kg/m    GENERAL:alert, no distress and comfortable SKIN: skin color, texture, turgor are normal, no rashes or significant lesions EYES: normal, conjunctiva are pink and non-injected, sclera clear OROPHARYNX:no exudate, no erythema and lips, buccal mucosa, and tongue normal  NECK: supple, thyroid normal size,  non-tender, without nodularity LYMPH:  no palpable lymphadenopathy in the cervical, axillary or inguinal LUNGS: clear to auscultation and percussion with normal breathing effort HEART: regular rate & rhythm and no murmurs and no lower extremity edema ABDOMEN:abdomen soft, non-tender and normal bowel sounds, surgical incision sites are well healed. Musculoskeletal:no cyanosis of digits and no clubbing  PSYCH: alert & oriented x 3 with fluent speech NEURO: no focal motor/sensory deficits  LABORATORY DATA:  I have reviewed the data as listed CBC Latest Ref Rng & Units 04/03/2018 09/09/2017 01/14/2017  WBC 4.0 - 10.5 K/uL 10.0 9.6 13.3(H)  Hemoglobin 13.0 - 17.0 g/dL 14.8 14.7 15.9  Hematocrit 39.0 - 52.0 % 43.2 43.9 46.7  Platelets 150 - 400 K/uL 203 265 200   CMP Latest Ref Rng & Units 04/03/2018 09/09/2017 01/14/2017  Glucose 70 - 99 mg/dL 91 142(H) 93  BUN 6 - 20 mg/dL 10 7 6.3(L)  Creatinine 0.61 - 1.24 mg/dL 0.80 0.93 0.8  Sodium 135 - 145 mmol/L 141 139 140  Potassium 3.5 - 5.1 mmol/L 4.0 3.5 3.6  Chloride 98 - 111 mmol/L 108 107 -  CO2 22 - 32 mmol/L _1 Calcium 8.9 - 10.3 mg/dL 9.6 9.3 10.0  Total Protein 6.5 - 8.1 g/dL 7.9 7.8 8.2  Total Bilirubin 0.3 - 1.2 mg/dL 0.4 0.3 0.44  Alkaline Phos 38 - 126 U/L 88 92 97  AST 15 - 41 U/L 18 26 34  ALT 0 - 44 U/L 37 69(H) 80(H)   CEA:  06/11/2015: 26.3 08/11/2015: 5.6 10/29/2015: 5.8 04/06/2016: 8.3 08/13/2016: 6.5 01/14/17: 11.92 09/09/17: 6.10 04/03/18: pending   PROCEDURE  Colonoscopy with Dr. Silverio Decamp on 02/16/17 IMPRESSION - Patent end-to-side ileo-colonic anastomosis, characterized by healthy appearing mucosa. - The examination was otherwise normal. - No specimens collected.  Pathology report Diagnosis 06/12/2015 Colon, segmental resection for tumor, transverse and right colon MODERATELY DIFFERENTIATED COLONIC ADENOCARCINOMA (3 CM) THE TUMOR INVADE THROUGH THE MUSCULARIS PROPRIA INTO PERICOLONIC TISSUE (T3) ALL  MARGINS OF RESECTION ARE NEGATIVE FOR TUMOR METASTATIC COLONIC ADENOCARCINOMA IN SEVEN OF THIRTY LYMPH NODES (7/30, N2B) TUBULAR ADENOMA (X1) TUBULAR ADENOMA INVOLVING APPENDIX Microscopic Comment COLON AND RECTUM (INCLUDING TRANS-ANAL RESECTION): Specimen: Right and transverse colon Procedure: segmental resection Tumor site: Transverse colon Specimen integrity: Intact Macroscopic intactness of mesorectum: Not applicable: _ Macroscopic tumor perforation: Invading through the muscularis propria into pericolonic soft tissue Invasive tumor: Maximum size: 3 cm Histologic type(s): Adenocarcinoma Histologic grade and differentiation: G2 G1: well  differentiated/low grade G2: moderately differentiated/low grade G3: poorly differentiated/high grade G4: undifferentiated/high grade Type of polyp in which invasive carcinoma arose: Tubular adenoma Microscopic extension of invasive tumor: Invading through the muscularis propria into pericolonic soft tissue Lymph-Vascular invasion: Negative Peri-neural invasion: Negative 1 of 3 FINAL for Ostrom, Arian V ([id number]) Microscopic Comment(continued) Tumor deposit(s) (discontinuous extramural extension): Negative Resection margins: Proximal margin: negative Distal margin: negative Circumferential (radial) (posterior ascending, posterior descending; lateral and posterior mid-rectum; and entire lower 1/3 rectum): negative Mesenteric margin (sigmoid and transverse): negative Distance closest margin (if all above margins negative): 3.5 cm Treatment effect (neo-adjuvant therapy): Negative Additional polyp(s): Tubular adenoma Non-neoplastic findings: Unremarkable Lymph nodes: number examined 30; number positive: 7 Pathologic Staging: T3, N2b, M_ Ancillary studies: Microsatelite instability PCR ordered Casimer Lanius MD Pathologist, Electronic Signature (Case signed 06/17/2015)      RADIOGRAPHIC STUDIES: I have personally reviewed the  radiological images as listed and agreed with the findings in the report.  CT CAP W Contrast 09/08/17 IMPRESSION: 1. There is a new 0.8 cm subcapsular enhancing lesion within the right hepatic lobe which is indeterminate on current exam. While this may represent a benign lesion, recommend further evaluation with abdominal MRI for definitive characterization. 2. Transient hepatic perfusion within the left hepatic lobe which is likely benign in etiology. Recommend attention on abdominal MRI. 3. New 2 mm right lower lobe nodule. Additional nodules are stable. Recommend attention on follow-up. 4. Hepatic steatosis.  CT CAP w contrast 08/16/2016 IMPRESSION: 1. No evidence metastatic disease in the chest, abdomen or pelvis. 2. Subpleural nodularity at both lung bases is stable from 2016, probably subpleural lymph nodes. 3. Mild hepatic steatosis. 4. Bilateral L5 pars defects.  ENDOSCOPIC IMPRESSION: 06/10/2015 Friable ulcerated mucosa in distal to mid transverse with stricture, scope couldn't be advanced beyond it, ultiple biopsies taken from the edge of the mass. 11 sessile polyps removed   ASSESSMENT & PLAN:   47 y.o. [place] male, without significant past medical history, presented with biopsy action.  Colonoscopy showed a ulcerative mass in the transverse colon, and multiple polyps.   1. Right colon adenocarcinoma, [id number], stage IIIB, MSI-stable  -I previously reviewed his CT scan and surgical path in great details, including the staging, and molecular features.  -I previously reviewed the nature history of colon cancer, and risk of cancer recurrence after surgery. Due to his locally advanced disease, especially multiple (7) positive nodes, his risk of recurrence is very high.  -He has completed adjuvant chemo with CAPEOX for 6 months, and tolerated well. -I previously reviewed the CT scan from 08/17/2015 with the patient, which showed no evidence of recurrence   -Colonoscopy from  02/15/17 was normal, no samples taken -CT CAP W Contrast from 09/09/17 revealed a new 0.8 cm lesion in the liver that is indeterminate. I discussed the results and reviewed the scan with the pt in person. He voiced good understanding.  - Abdominal MRI is recommended to rule out metastatic colon cancer to liver in 08/2017 but he never did it. He is agreeable to have a MRI in the next few weeks.  -He is clinically doing well, exam was unremarkable. -Labs reviewed CBC is WNLs. CMP and CEA pending. No clinical concern for recurrence. -f/u in 6 months  2. Hepatic steatosis and transaminitis -He previously had mild intermittent transaminitis, likely benign, we'll continue monitoring. 09/09/17 CT scan shows steatosis -CMP pending today  Plan  -Abdomen MRI in 2 weeks, ordered today  -Lab and f/u in 6 months  All questions were answered. The patient knows to call the clinic with any problems, questions or concerns.  I spent 15 minutes counseling the patient face to face. The total time spent in the appointment was 20 minutes and more than 50% was on counseling.  Dierdre Searles Dweik am acting as scribe for Dr. Truitt Merle.  I have reviewed the above documentation for accuracy and completeness, and I agree with the above.     Truitt Merle, MD 04/03/2018

## 2018-04-04 LAB — CEA (IN HOUSE-CHCC): CEA (CHCC-IN HOUSE): 7.01 ng/mL — AB (ref 0.00–5.00)

## 2018-05-15 ENCOUNTER — Ambulatory Visit (HOSPITAL_COMMUNITY)
Admission: RE | Admit: 2018-05-15 | Discharge: 2018-05-15 | Disposition: A | Discharge status: Home / Self Care | Payer: Self-pay | Source: Ambulatory Visit | Attending: Hematology | Admitting: Hematology

## 2018-05-15 DIAGNOSIS — C182 Malignant neoplasm of ascending colon: Secondary | ICD-10-CM | POA: Insufficient documentation

## 2018-05-15 DIAGNOSIS — K760 Fatty (change of) liver, not elsewhere classified: Secondary | ICD-10-CM | POA: Insufficient documentation

## 2018-05-15 DIAGNOSIS — D1803 Hemangioma of intra-abdominal structures: Secondary | ICD-10-CM | POA: Insufficient documentation

## 2018-05-15 MED ORDER — GADOBUTROL 1 MMOL/ML IV SOLN
7.5000 mL | Freq: Once | INTRAVENOUS | Status: AC | PRN
  Administered 2018-05-15: 7 mL via INTRAVENOUS

## 2018-05-19 ENCOUNTER — Telehealth: Payer: Self-pay

## 2018-05-19 NOTE — Telephone Encounter (Signed)
-----   Message from Truitt Merle, MD sent at 05/18/2018 11:56 PM EST ----- Please tell pt the MRI results, no concerns. I will see him next April as scheduled, thanks   Truitt Merle  05/18/2018

## 2018-05-19 NOTE — Telephone Encounter (Signed)
Left voice message for patient using [employer] video interpreter Darrall Dears [phone] that MRI results are good, no concerns, follow up with Dr. Burr Medico in April as scheduled.

## 2018-09-29 NOTE — Progress Notes (Signed)
[place]   Telephone:(336) [phone] Fax:(336) [phone]   Clinic Follow up Note   Patient Care Team: Patient, No Pcp Per as PCP - General (General Practice)  Date of Service:  10/02/2018  CHIEF COMPLAINT: Follow up stage III right colon cancer  SUMMARY OF ONCOLOGIC HISTORY: Oncology History   Cancer of right colon [facility])   Staging form: Colon and Rectum, AJCC 7th Edition     Pathologic stage from 06/12/2015: Stage IIIC (T3, N2b, cM0) - Signed by Truitt Merle, MD on 07/07/2015       Cancer of right colon ([place])   06/09/2015 Imaging    CT chest abdomen and pelvis showed proximal and a transverse colitis, fluid-filled and dilated transverse colon indicating of obstruction related to a mass at the junction of the transverse and descending colon. trace left pleural effusion.     06/10/2015 Initial Diagnosis    Cancer of right colon ([place])    06/10/2015 Procedure    Colonoscopy showed a friable ulcerated mucosa in distal to mid transverse with stricture, scope couldn't be advanced beyond the. 11 sessile polyps removed.    06/12/2015 Surgery    Right hemicolectomy and lymph node dissection.    06/12/2015 Pathology Results    Moderately differentiated colonic adenocarcinoma, 3 cm, G2, located in the transverse colon, LVI(-), perineural invasion (-), T3, margins were negative, 7 out of 30 lymph nodes were positive, polyps were tubular adenoma.    07/31/2015 - 01/06/2016 Chemotherapy    adjuvant chemo CAPEOX, oxaliplatin 130 mg/m, Xeloda 1000 mg/m on day 1-14, every 21 days, S/P 8 cycles     08/16/2016 Imaging    CT CAP w/ CONTRAST 08/16/16 IMPRESSION: 1. No evidence metastatic disease in the chest, abdomen or pelvis. 2. Subpleural nodularity at both lung bases is stable from 2016, probably subpleural lymph nodes. 3. Mild hepatic steatosis. 4. Bilateral L5 pars defects.    02/16/2017 Procedure    Colonoscopy with Dr. Silverio Decamp on 02/16/17 IMPRESSION - Patent end-to-side  ileo-colonic anastomosis, characterized by healthy appearing mucosa. - The examination was otherwise normal. - No specimens collected.    09/08/2017 Imaging    CT CAP W Contrast 09/08/17 IMPRESSION: 1. There is a new 0.8 cm subcapsular enhancing lesion within the right hepatic lobe which is indeterminate on current exam. While this may represent a benign lesion, recommend further evaluation with abdominal MRI for definitive characterization. 2. Transient hepatic perfusion within the left hepatic lobe which is likely benign in etiology. Recommend attention on abdominal MRI. 3. New 2 mm right lower lobe nodule. Additional nodules are stable. Recommend attention on follow-up. 4. Hepatic steatosis.    05/15/2018 Imaging    MRI abdomen  IMPRESSION: 1. No acute findings within the abdomen. 2. The enhancing lesion within the subcapsular right hepatic lobe is again noted. Imaging characteristics are favored to represent a benign abnormality such as a liver hemangioma. This is remains stable compared with 09/08/2017. 3. Hepatic steatosis with areas of focal fatty sparing adjacent to the gallbladder fossa.      CURRENT THERAPY:  surveillance  INTERVAL HISTORY:  John Moon is here for a follow up of colon cancer. He presents to the clinic today with his [place]  interpretor. He notes he is doing well. He has normal bowel movements and denies abdominal issues. He notes he eat a lot especially since social isolation. He has gained 6 pounds in 2 months.  He denied SOB, cough or fever.  He is currently not working.  He notes he smokes still but cut back to 4 cigarettes a day.    REVIEW OF SYSTEMS:   Constitutional: Denies fevers, chills or abnormal weight loss (+) weight gain  Eyes: Denies blurriness of vision Ears, nose, mouth, throat, and face: Denies mucositis or sore throat Respiratory: Denies cough, dyspnea or wheezes Cardiovascular: Denies palpitation, chest discomfort or lower  extremity swelling Gastrointestinal:  Denies nausea, heartburn or change in bowel habits Skin: Denies abnormal skin rashes Lymphatics: Denies new lymphadenopathy or easy bruising Neurological:Denies numbness, tingling or new weaknesses Behavioral/Psych: Mood is stable, no new changes  All other systems were reviewed with the patient and are negative.  MEDICAL HISTORY:  Past Medical History:  Diagnosis Date  . Colon cancer (John Moon)    colon cancer chemo only    SURGICAL HISTORY: Past Surgical History:  Procedure Laterality Date  . COLONOSCOPY    . COLONOSCOPY WITH PROPOFOL N/A 06/10/2015   Procedure: COLONOSCOPY WITH PROPOFOL;  Surgeon: Mauri Pole, MD;  Location: [facility];  Service: Endoscopy;  Laterality: N/A;  . LAPAROSCOPIC PARTIAL COLECTOMY N/A 06/12/2015   Procedure: LAPAROSCOPIC ASSISTED  PARTIAL COLECTOMY;  Surgeon: Autumn Messing III, MD;  Location: [facility];  Service: General;  Laterality: N/A;  . PARTIAL COLECTOMY  06/12/2015   Procedure: PARTIAL COLECTOMY;  Surgeon: Autumn Messing III, MD;  Location: [facility];  Service: General;;    I have reviewed the social history and family history with the patient and they are unchanged from previous note.  ALLERGIES:  has No Known Allergies.  MEDICATIONS:  No current outpatient medications on file.   No current facility-administered medications for this visit.     PHYSICAL EXAMINATION: ECOG PERFORMANCE STATUS: 0 - Asymptomatic  Vitals:   10/02/18 1256  BP: 118/84  Pulse: 73  Resp: 19  Temp: 98.7 F (37.1 C)  SpO2: 99%   Filed Weights   10/02/18 1256  Weight: 153 lb 12.8 oz (69.8 kg)    GENERAL:alert, no distress and comfortable SKIN: skin color, texture, turgor are normal, no rashes or significant lesions EYES: normal, Conjunctiva are pink and non-injected, sclera clear OROPHARYNX:no exudate, no erythema and lips, buccal mucosa, and tongue normal  NECK: supple, thyroid normal size, non-tender, without nodularity  LYMPH:  no palpable lymphadenopathy in the cervical, axillary or inguinal LUNGS: clear to auscultation and percussion with normal breathing effort HEART: regular rate & rhythm and no murmurs and no lower extremity edema ABDOMEN:abdomen soft, non-tender and normal bowel sounds Musculoskeletal:no cyanosis of digits and no clubbing  NEURO: alert & oriented x 3 with fluent speech, no focal motor/sensory deficits  LABORATORY DATA:  I have reviewed the data as listed CBC Latest Ref Rng & Units 10/02/2018 04/03/2018 09/09/2017  WBC 4.0 - 10.5 K/uL 10.0 10.0 9.6  Hemoglobin 13.0 - 17.0 g/dL 14.9 14.8 14.7  Hematocrit 39.0 - 52.0 % 44.5 43.2 43.9  Platelets 150 - 400 K/uL 203 203 265     CMP Latest Ref Rng & Units 10/02/2018 04/03/2018 09/09/2017  Glucose 70 - 99 mg/dL 113(H) 91 142(H)  BUN 6 - 20 mg/dL '10 10 7  '$ Creatinine 0.61 - 1.24 mg/dL 0.81 0.80 0.93  Sodium 135 - 145 mmol/L 142 141 139  Potassium 3.5 - 5.1 mmol/L 3.5 4.0 3.5  Chloride 98 - 111 mmol/L 106 108 107  CO2 22 - 32 mmol/L '25 23 22  '$ Calcium 8.9 - 10.3 mg/dL 9.5 9.6 9.3  Total Protein 6.5 - 8.1 g/dL 7.9 7.9 7.8  Total Bilirubin  0.3 - 1.2 mg/dL 0.4 0.4 0.3  Alkaline Phos 38 - 126 U/L 68 88 92  AST 15 - 41 U/L 37 18 26  ALT 0 - 44 U/L 87(H) 37 69(H)      RADIOGRAPHIC STUDIES: I have personally reviewed the radiological images as listed and agreed with the findings in the report. No results found.   ASSESSMENT & PLAN:  GREYSYN VANDERBERG is a 48 y.o. male with   1. Right colon adenocarcinoma, pT3N2bM0, stage IIIB, MSI-stable  -He was diagnosed in 05/2015. He was treated with right hemicolectomy and adjuvant chemo CAPOX -Colonoscopy from 02/15/17 was normal, no samples taken. Repeat in 01/2020.  -He is clinically doing well. He has been gaining more weight. I encouraged to watch what he eats and increase activity to lose control his weight. His physical exam be unremarkable.  -Labs reviewed, CBC WNL, CMPWNL except mildly elevated  ALT. CEA is still pending. There is no clinical concern for cancer recurrence. He is almost 3.5 years since diagnosis. I discussed his risk of recurrence is very small now. Will continue surveillance for a total of 5 years. I do not plan to repeat surveillance CT scan at this point  -F/u in 6 months with lab  -last colonoscopy in 2018 was clear, repeat colonoscopy in 2021 per GI   2. Hepatic steatosis and transaminitis -He previously had mild intermittent transaminitis, likely benign, we'll continue monitoring. 09/09/17 CT scan shows steatosis  3. Smoking Cessation -He has slowed down smoking to 4 cigarettes a day. I strongly encouraged him to completely quit. I reviewed smoking cessation with him and explained smoking can cause other health problems. He understands and agrees.    Plan  -Lab and f/u in 6 months     No problem-specific Assessment & Plan notes found for this encounter.   No orders of the defined types were placed in this encounter.  All questions were answered. The patient knows to call the clinic with any problems, questions or concerns. No barriers to learning was detected. I spent 10 minutes counseling the patient face to face. The total time spent in the appointment was 15 minutes and more than 50% was on counseling and review of test results     Truitt Merle, MD 10/02/2018   I, Joslyn Devon, am acting as scribe for Truitt Merle, MD.   I have reviewed the above documentation for accuracy and completeness, and I agree with the above.

## 2018-10-02 ENCOUNTER — Telehealth: Payer: Self-pay | Admitting: Hematology

## 2018-10-02 ENCOUNTER — Encounter: Payer: Self-pay | Admitting: Hematology

## 2018-10-02 ENCOUNTER — Inpatient Hospital Stay: Payer: BLUE CROSS/BLUE SHIELD | Attending: Hematology

## 2018-10-02 ENCOUNTER — Inpatient Hospital Stay (HOSPITAL_BASED_OUTPATIENT_CLINIC_OR_DEPARTMENT_OTHER): Payer: BLUE CROSS/BLUE SHIELD | Admitting: Hematology

## 2018-10-02 ENCOUNTER — Other Ambulatory Visit: Payer: Self-pay

## 2018-10-02 VITALS — BP 118/84 | HR 73 | Temp 98.7°F | Resp 19 | Wt 153.8 lb

## 2018-10-02 DIAGNOSIS — Z85118 Personal history of other malignant neoplasm of bronchus and lung: Secondary | ICD-10-CM | POA: Insufficient documentation

## 2018-10-02 DIAGNOSIS — K760 Fatty (change of) liver, not elsewhere classified: Secondary | ICD-10-CM | POA: Insufficient documentation

## 2018-10-02 DIAGNOSIS — F17210 Nicotine dependence, cigarettes, uncomplicated: Secondary | ICD-10-CM

## 2018-10-02 DIAGNOSIS — Z9221 Personal history of antineoplastic chemotherapy: Secondary | ICD-10-CM

## 2018-10-02 DIAGNOSIS — C182 Malignant neoplasm of ascending colon: Secondary | ICD-10-CM

## 2018-10-02 DIAGNOSIS — D1803 Hemangioma of intra-abdominal structures: Secondary | ICD-10-CM

## 2018-10-02 DIAGNOSIS — R911 Solitary pulmonary nodule: Secondary | ICD-10-CM

## 2018-10-02 LAB — CBC WITH DIFFERENTIAL/PLATELET
Abs Immature Granulocytes: 0.1 10*3/uL — ABNORMAL HIGH (ref 0.00–0.07)
Basophils Absolute: 0.1 10*3/uL (ref 0.0–0.1)
Basophils Relative: 1 %
Eosinophils Absolute: 0.3 10*3/uL (ref 0.0–0.5)
Eosinophils Relative: 3 %
HCT: 44.5 % (ref 39.0–52.0)
Hemoglobin: 14.9 g/dL (ref 13.0–17.0)
Immature Granulocytes: 1 %
Lymphocytes Relative: 40 %
Lymphs Abs: 4 10*3/uL (ref 0.7–4.0)
MCH: 29.7 pg (ref 26.0–34.0)
MCHC: 33.5 g/dL (ref 30.0–36.0)
MCV: 88.8 fL (ref 80.0–100.0)
Monocytes Absolute: 0.7 10*3/uL (ref 0.1–1.0)
Monocytes Relative: 7 %
Neutro Abs: 4.8 10*3/uL (ref 1.7–7.7)
Neutrophils Relative %: 48 %
Platelets: 203 10*3/uL (ref 150–400)
RBC: 5.01 MIL/uL (ref 4.22–5.81)
RDW: 12.7 % (ref 11.5–15.5)
WBC: 10 10*3/uL (ref 4.0–10.5)
nRBC: 0 % (ref 0.0–0.2)

## 2018-10-02 LAB — CMP (CANCER CENTER ONLY)
ALT: 87 U/L — ABNORMAL HIGH (ref 0–44)
AST: 37 U/L (ref 15–41)
Albumin: 4.3 g/dL (ref 3.5–5.0)
Alkaline Phosphatase: 68 U/L (ref 38–126)
Anion gap: 11 (ref 5–15)
BUN: 10 mg/dL (ref 6–20)
CO2: 25 mmol/L (ref 22–32)
Calcium: 9.5 mg/dL (ref 8.9–10.3)
Chloride: 106 mmol/L (ref 98–111)
Creatinine: 0.81 mg/dL (ref 0.61–1.24)
GFR, Est AFR Am: >60 mL/min (ref >60)
GFR, Estimated: >60 mL/min (ref >60)
Glucose, Bld: 113 mg/dL — ABNORMAL HIGH (ref 70–99)
Potassium: 3.5 mmol/L (ref 3.5–5.1)
Sodium: 142 mmol/L (ref 135–145)
Total Bilirubin: 0.4 mg/dL (ref 0.3–1.2)
Total Protein: 7.9 g/dL (ref 6.5–8.1)

## 2018-10-02 LAB — CEA (IN HOUSE-CHCC): CEA (CHCC-In House): 5.73 ng/mL — ABNORMAL HIGH (ref 0.00–5.00)

## 2018-10-02 NOTE — Telephone Encounter (Signed)
Scheduled appt per 4/13 los. °

## 2019-03-30 NOTE — Progress Notes (Signed)
[place]   Telephone:(336) [phone] Fax:(336) [phone]   Clinic Follow up Note   Patient Care Team: Patient, No Pcp Per as PCP - General (General Practice)  Date of Service:  04/04/2019  CHIEF COMPLAINT: Follow up stage III right colon cancer  SUMMARY OF ONCOLOGIC HISTORY: Oncology History Overview Note  Cancer of right colon [facility])   Staging form: Colon and Rectum, AJCC 7th Edition     Pathologic stage from 06/12/2015: Stage IIIC (T3, N2b, cM0) - Signed by John Merle, MD on 07/07/2015     Cancer of right colon ([place])  06/09/2015 Imaging   CT chest abdomen and pelvis showed proximal and a transverse colitis, fluid-filled and dilated transverse colon indicating of obstruction related to a mass at the junction of the transverse and descending colon. trace left pleural effusion.    06/10/2015 Initial Diagnosis   Cancer of right colon ([place])   06/10/2015 Procedure   Colonoscopy showed a friable ulcerated mucosa in distal to mid transverse with stricture, scope couldn't be advanced beyond the. 11 sessile polyps removed.   06/12/2015 Surgery   Right hemicolectomy and lymph node dissection.   06/12/2015 Pathology Results   Moderately differentiated colonic adenocarcinoma, 3 cm, G2, located in the transverse colon, LVI(-), perineural invasion (-), T3, margins were negative, 7 out of 30 lymph nodes were positive, polyps were tubular adenoma.   07/31/2015 - 01/06/2016 Chemotherapy   adjuvant chemo CAPEOX, oxaliplatin 130 mg/m, Xeloda 1000 mg/m on day 1-14, every 21 days, S/P 8 cycles    08/16/2016 Imaging   CT CAP w/ CONTRAST 08/16/16 IMPRESSION: 1. No evidence metastatic disease in the chest, abdomen or pelvis. 2. Subpleural nodularity at both lung bases is stable from 2016, probably subpleural lymph nodes. 3. Mild hepatic steatosis. 4. Bilateral L5 pars defects.   02/16/2017 Procedure   Colonoscopy with Dr. Silverio Decamp on 02/16/17 IMPRESSION - Patent end-to-side  ileo-colonic anastomosis, characterized by healthy appearing mucosa. - The examination was otherwise normal. - No specimens collected.   09/08/2017 Imaging   CT CAP W Contrast 09/08/17 IMPRESSION: 1. There is a new 0.8 cm subcapsular enhancing lesion within the right hepatic lobe which is indeterminate on current exam. While this may represent a benign lesion, recommend further evaluation with abdominal MRI for definitive characterization. 2. Transient hepatic perfusion within the left hepatic lobe which is likely benign in etiology. Recommend attention on abdominal MRI. 3. New 2 mm right lower lobe nodule. Additional nodules are stable. Recommend attention on follow-up. 4. Hepatic steatosis.   05/15/2018 Imaging   MRI abdomen  IMPRESSION: 1. No acute findings within the abdomen. 2. The enhancing lesion within the subcapsular right hepatic lobe is again noted. Imaging characteristics are favored to represent a benign abnormality such as a liver hemangioma. This is remains stable compared with 09/08/2017. 3. Hepatic steatosis with areas of focal fatty sparing adjacent to the gallbladder fossa.      CURRENT THERAPY:  surveillance  INTERVAL HISTORY:  John Moon is here for a follow up of colon cancer. He presents to the clinic with a interpreter.  He is clinically doing very well, denies any abdominal discomfort, nausea, or change of bowel habits.  No melena, hematochezia, diarrhea or constipation.  He has good appetite, energy level, and no other complaints.  He is very concerned about his risk of COVID-19, because of off his coworkers's family member was found positive.  He has decided to start working tomorrow.   Review of system otherwise negative.  MEDICAL HISTORY:  Past Medical History:  Diagnosis Date  . Colon cancer ([place])    colon cancer chemo only    SURGICAL HISTORY: Past Surgical History:  Procedure Laterality Date  . COLONOSCOPY    . COLONOSCOPY WITH  PROPOFOL N/A 06/10/2015   Procedure: COLONOSCOPY WITH PROPOFOL;  Surgeon: Mauri Pole, MD;  Location: [facility];  Service: Endoscopy;  Laterality: N/A;  . LAPAROSCOPIC PARTIAL COLECTOMY N/A 06/12/2015   Procedure: LAPAROSCOPIC ASSISTED  PARTIAL COLECTOMY;  Surgeon: Autumn Messing III, MD;  Location: [facility];  Service: General;  Laterality: N/A;  . PARTIAL COLECTOMY  06/12/2015   Procedure: PARTIAL COLECTOMY;  Surgeon: Autumn Messing III, MD;  Location: [facility];  Service: General;;    I have reviewed the social history and family history with the patient and they are unchanged from previous note.  ALLERGIES:  has No Known Allergies.  MEDICATIONS:  No current outpatient medications on file.   No current facility-administered medications for this visit.     PHYSICAL EXAMINATION: ECOG PERFORMANCE STATUS: 0 - Asymptomatic  Vitals:   04/04/19 1251  BP: 128/90  Pulse: (!) 59  Resp: 18  Temp: 98 F (36.7 C)  SpO2: 100%   Filed Weights   04/04/19 1251  Weight: 145 lb 1.6 oz (65.8 kg)   GENERAL:alert, no distress and comfortable SKIN: skin color, texture, turgor are normal, no rashes or significant lesions EYES: normal, Conjunctiva are pink and non-injected, sclera clear NECK: supple, thyroid normal size, non-tender, without nodularity LYMPH:  no palpable lymphadenopathy in the cervical, axillary  LUNGS: clear to auscultation and percussion with normal breathing effort HEART: regular rate & rhythm and no murmurs and no lower extremity edema ABDOMEN:abdomen soft, non-tender and normal bowel sounds Musculoskeletal:no cyanosis of digits and no clubbing  NEURO: alert & oriented x 3 with fluent speech, no focal motor/sensory deficits  LABORATORY DATA:  I have reviewed the data as listed CBC Latest Ref Rng & Units 04/04/2019 10/02/2018 04/03/2018  WBC 4.0 - 10.5 K/uL 9.1 10.0 10.0  Hemoglobin 13.0 - 17.0 g/dL 15.6 14.9 14.8  Hematocrit 39.0 - 52.0 % 46.3 44.5 43.2  Platelets 150 - 400  K/uL 206 203 203     CMP Latest Ref Rng & Units 04/04/2019 10/02/2018 04/03/2018  Glucose 70 - 99 mg/dL 92 113(H) 91  BUN 6 - 20 mg/dL '11 10 10  '$ Creatinine 0.61 - 1.24 mg/dL 0.79 0.81 0.80  Sodium 135 - 145 mmol/L 140 142 141  Potassium 3.5 - 5.1 mmol/L 4.0 3.5 4.0  Chloride 98 - 111 mmol/L 105 106 108  CO2 22 - 32 mmol/L '23 25 23  '$ Calcium 8.9 - 10.3 mg/dL 9.7 9.5 9.6  Total Protein 6.5 - 8.1 g/dL 8.1 7.9 7.9  Total Bilirubin 0.3 - 1.2 mg/dL 0.4 0.4 0.4  Alkaline Phos 38 - 126 U/L 85 68 88  AST 15 - 41 U/L 23 37 18  ALT 0 - 44 U/L 41 87(H) 37      RADIOGRAPHIC STUDIES: I have personally reviewed the radiological images as listed and agreed with the findings in the report. No results found.   ASSESSMENT & PLAN:  John Moon is a 48 y.o. male with   1. Right colon adenocarcinoma, pT3N2bM0, stage IIIB, MSI-stable  -He was diagnosed in 05/2015. He was treated with right hemicolectomy and adjuvant chemo CAPOX -Colonoscopy from 02/15/17 was normal, no samples taken. Repeat in 01/2020.  -He is clinically doing very well, asymptomatic.  Exam was  unremarkable.  Lab reviewed, CBC normal, CMP and CEA are still pending.  No clinical concern for recurrence. -He is almost 4 years out of her initial diagnosis, his risk of recurrence is minimal now. -We will see him back in 1 year for last follow-up  2. Hepatic steatosis and transaminitis -He previously had mild intermittent transaminitis, likely benign, we'll continue monitoring. 09/09/17 CT scan shows steatosis     Plan -Lab and follow-up in 1 year   No problem-specific Assessment & Plan notes found for this encounter.   No orders of the defined types were placed in this encounter.  All questions were answered. The patient knows to call the clinic with any problems, questions or concerns. No barriers to learning was detected. I spent 10 minutes counseling the patient face to face. The total time spent in the appointment was 15  minutes and more than 50% was on counseling and review of test results     John Merle, MD 04/04/2019   I, Joslyn Devon, am acting as scribe for John Merle, MD.   I have reviewed the above documentation for accuracy and completeness, and I agree with the above.

## 2019-04-04 ENCOUNTER — Inpatient Hospital Stay: Payer: BLUE CROSS/BLUE SHIELD | Attending: Hematology

## 2019-04-04 ENCOUNTER — Other Ambulatory Visit: Payer: Self-pay

## 2019-04-04 ENCOUNTER — Inpatient Hospital Stay (HOSPITAL_BASED_OUTPATIENT_CLINIC_OR_DEPARTMENT_OTHER): Payer: BLUE CROSS/BLUE SHIELD | Admitting: Hematology

## 2019-04-04 ENCOUNTER — Encounter: Payer: Self-pay | Admitting: Hematology

## 2019-04-04 VITALS — BP 128/90 | HR 59 | Temp 98.0°F | Resp 18 | Ht 66.0 in | Wt 145.1 lb

## 2019-04-04 DIAGNOSIS — R7401 Elevation of levels of liver transaminase levels: Secondary | ICD-10-CM | POA: Diagnosis not present

## 2019-04-04 DIAGNOSIS — C182 Malignant neoplasm of ascending colon: Secondary | ICD-10-CM | POA: Diagnosis not present

## 2019-04-04 DIAGNOSIS — K760 Fatty (change of) liver, not elsewhere classified: Secondary | ICD-10-CM | POA: Insufficient documentation

## 2019-04-04 DIAGNOSIS — Z9221 Personal history of antineoplastic chemotherapy: Secondary | ICD-10-CM | POA: Diagnosis not present

## 2019-04-04 DIAGNOSIS — Z85038 Personal history of other malignant neoplasm of large intestine: Secondary | ICD-10-CM | POA: Insufficient documentation

## 2019-04-04 LAB — CBC WITH DIFFERENTIAL/PLATELET
Abs Immature Granulocytes: 0.03 10*3/uL (ref 0.00–0.07)
Basophils Absolute: 0 10*3/uL (ref 0.0–0.1)
Basophils Relative: 0 %
Eosinophils Absolute: 0.2 10*3/uL (ref 0.0–0.5)
Eosinophils Relative: 2 %
HCT: 46.3 % (ref 39.0–52.0)
Hemoglobin: 15.6 g/dL (ref 13.0–17.0)
Immature Granulocytes: 0 %
Lymphocytes Relative: 32 %
Lymphs Abs: 2.9 10*3/uL (ref 0.7–4.0)
MCH: 29.8 pg (ref 26.0–34.0)
MCHC: 33.7 g/dL (ref 30.0–36.0)
MCV: 88.5 fL (ref 80.0–100.0)
Monocytes Absolute: 0.4 10*3/uL (ref 0.1–1.0)
Monocytes Relative: 5 %
Neutro Abs: 5.6 10*3/uL (ref 1.7–7.7)
Neutrophils Relative %: 61 %
Platelets: 206 10*3/uL (ref 150–400)
RBC: 5.23 MIL/uL (ref 4.22–5.81)
RDW: 12.7 % (ref 11.5–15.5)
WBC: 9.1 10*3/uL (ref 4.0–10.5)
nRBC: 0 % (ref 0.0–0.2)

## 2019-04-04 LAB — CEA (IN HOUSE-CHCC): CEA (CHCC-In House): 6.9 ng/mL — ABNORMAL HIGH (ref 0.00–5.00)

## 2019-04-04 LAB — COMPREHENSIVE METABOLIC PANEL
ALT: 41 U/L (ref 0–44)
AST: 23 U/L (ref 15–41)
Albumin: 4.6 g/dL (ref 3.5–5.0)
Alkaline Phosphatase: 85 U/L (ref 38–126)
Anion gap: 12 (ref 5–15)
BUN: 11 mg/dL (ref 6–20)
CO2: 23 mmol/L (ref 22–32)
Calcium: 9.7 mg/dL (ref 8.9–10.3)
Chloride: 105 mmol/L (ref 98–111)
Creatinine, Ser: 0.79 mg/dL (ref 0.61–1.24)
GFR calc Af Amer: >60 mL/min (ref >60)
GFR calc non Af Amer: >60 mL/min (ref >60)
Glucose, Bld: 92 mg/dL (ref 70–99)
Potassium: 4 mmol/L (ref 3.5–5.1)
Sodium: 140 mmol/L (ref 135–145)
Total Bilirubin: 0.4 mg/dL (ref 0.3–1.2)
Total Protein: 8.1 g/dL (ref 6.5–8.1)

## 2019-04-05 ENCOUNTER — Telehealth: Payer: Self-pay | Admitting: Hematology

## 2019-04-05 NOTE — Telephone Encounter (Signed)
Scheduled appt per 10/14 los.  Spoke with patient and he is aware of his appt date and time.

## 2020-04-01 ENCOUNTER — Telehealth: Payer: Self-pay | Admitting: Nurse Practitioner

## 2020-04-01 NOTE — Telephone Encounter (Signed)
R/s 10/14 appt per sch msg - patient request. Called with interpreter. Left msg. Mailed printout

## 2020-04-03 ENCOUNTER — Inpatient Hospital Stay: Payer: Self-pay

## 2020-04-03 ENCOUNTER — Inpatient Hospital Stay: Payer: Self-pay | Admitting: Nurse Practitioner

## 2020-04-13 NOTE — Progress Notes (Signed)
[place]   Telephone:(336) [phone] Fax:([phone]   Clinic Follow up Note   Patient Care Team: Patient, No Pcp Per as PCP - General (General Practice) 04/14/2020  CHIEF COMPLAINT: F/u stage III right colon cancer   SUMMARY OF ONCOLOGIC HISTORY: Oncology History Overview Note  Cancer of right colon [facility])   Staging form: Colon and Rectum, AJCC 7th Edition     Pathologic stage from 06/12/2015: Stage IIIC (T3, N2b, cM0) - Signed by Truitt Merle, MD on 07/07/2015     Cancer of right colon ([place])  06/09/2015 Imaging   CT chest abdomen and pelvis showed proximal and a transverse colitis, fluid-filled and dilated transverse colon indicating of obstruction related to a mass at the junction of the transverse and descending colon. trace left pleural effusion.    06/10/2015 Initial Diagnosis   Cancer of right colon ([place])   06/10/2015 Procedure   Colonoscopy showed a friable ulcerated mucosa in distal to mid transverse with stricture, scope couldn't be advanced beyond the. 11 sessile polyps removed.   06/12/2015 Surgery   Right hemicolectomy and lymph node dissection.   06/12/2015 Pathology Results   Moderately differentiated colonic adenocarcinoma, 3 cm, G2, located in the transverse colon, LVI(-), perineural invasion (-), T3, margins were negative, 7 out of 30 lymph nodes were positive, polyps were tubular adenoma.   07/31/2015 - 01/06/2016 Chemotherapy   adjuvant chemo CAPEOX, oxaliplatin 130 mg/m, Xeloda 1000 mg/m on day 1-14, every 21 days, S/P 8 cycles    08/16/2016 Imaging   CT CAP w/ CONTRAST 08/16/16 IMPRESSION: 1. No evidence metastatic disease in the chest, abdomen or pelvis. 2. Subpleural nodularity at both lung bases is stable from 2016, probably subpleural lymph nodes. 3. Mild hepatic steatosis. 4. Bilateral L5 pars defects.   02/16/2017 Procedure   Colonoscopy with Dr. Silverio Decamp on 02/16/17 IMPRESSION - Patent end-to-side ileo-colonic anastomosis,  characterized by healthy appearing mucosa. - The examination was otherwise normal. - No specimens collected.   09/08/2017 Imaging   CT CAP W Contrast 09/08/17 IMPRESSION: 1. There is a new 0.8 cm subcapsular enhancing lesion within the right hepatic lobe which is indeterminate on current exam. While this may represent a benign lesion, recommend further evaluation with abdominal MRI for definitive characterization. 2. Transient hepatic perfusion within the left hepatic lobe which is likely benign in etiology. Recommend attention on abdominal MRI. 3. New 2 mm right lower lobe nodule. Additional nodules are stable. Recommend attention on follow-up. 4. Hepatic steatosis.   05/15/2018 Imaging   MRI abdomen  IMPRESSION: 1. No acute findings within the abdomen. 2. The enhancing lesion within the subcapsular right hepatic lobe is again noted. Imaging characteristics are favored to represent a benign abnormality such as a liver hemangioma. This is remains stable compared with 09/08/2017. 3. Hepatic steatosis with areas of focal fatty sparing adjacent to the gallbladder fossa.     CURRENT THERAPY: Surveillance   INTERVAL HISTORY: John Moon returns for f/u as scheduled. He was seen by Dr. Burr Medico last year in 03/2019.  He is doing well.  He has a normal appetite and energy level.  He is working a lot.  Denies any abdominal pain or bloating, change in bowel habits, rectal bleeding, recent fever, chills, cough, chest pain, dyspnea, or new concerns.   MEDICAL HISTORY:  Past Medical History:  Diagnosis Date  . Colon cancer ([place])    colon cancer chemo only    SURGICAL HISTORY: Past Surgical History:  Procedure Laterality Date  .  COLONOSCOPY    . COLONOSCOPY WITH PROPOFOL N/A 06/10/2015   Procedure: COLONOSCOPY WITH PROPOFOL;  Surgeon: Mauri Pole, MD;  Location: [facility];  Service: Endoscopy;  Laterality: N/A;  . LAPAROSCOPIC PARTIAL COLECTOMY N/A 06/12/2015   Procedure:  LAPAROSCOPIC ASSISTED  PARTIAL COLECTOMY;  Surgeon: Autumn Messing III, MD;  Location: [facility];  Service: General;  Laterality: N/A;  . PARTIAL COLECTOMY  06/12/2015   Procedure: PARTIAL COLECTOMY;  Surgeon: Autumn Messing III, MD;  Location: [facility];  Service: General;;    I have reviewed the social history and family history with the patient and they are unchanged from previous note.  ALLERGIES:  has No Known Allergies.  MEDICATIONS:  No current outpatient medications on file.   No current facility-administered medications for this visit.    PHYSICAL EXAMINATION: ECOG PERFORMANCE STATUS: 0 - Asymptomatic  Vitals:   04/14/20 1139  BP: 134/85  Pulse: 60  Resp: 20  Temp: 98.1 F (36.7 C)  SpO2: 100%   Filed Weights   04/14/20 1139  Weight: 139 lb 12.8 oz (63.4 kg)    GENERAL:alert, no distress and comfortable SKIN: No rash EYES: sclera clear LYMPH:  no palpable cervical or supraclavicular lymphadenopathy  LUNGS: clear with normal breathing effort HEART: regular rate & rhythm, no lower extremity edema ABDOMEN:abdomen soft, non-tender and normal bowel sounds.  No hepatomegaly or mass NEURO: alert & oriented x 3 with fluent speech, no focal motor/sensory deficits  LABORATORY DATA:  I have reviewed the data as listed CBC Latest Ref Rng & Units 04/14/2020 04/04/2019 10/02/2018  WBC 4.0 - 10.5 K/uL 8.8 9.1 10.0  Hemoglobin 13.0 - 17.0 g/dL 14.7 15.6 14.9  Hematocrit 39 - 52 % 44.2 46.3 44.5  Platelets 150 - 400 K/uL 190 206 203     CMP Latest Ref Rng & Units 04/14/2020 04/04/2019 10/02/2018  Glucose 70 - 99 mg/dL 123(H) 92 113(H)  BUN 6 - 20 mg/dL _0 Creatinine 0.61 - 1.24 mg/dL 0.80 0.79 0.81  Sodium 135 - 145 mmol/L 139 140 142  Potassium 3.5 - 5.1 mmol/L 3.5 4.0 3.5  Chloride 98 - 111 mmol/L 107 105 106  CO2 22 - 32 mmol/L _1 Calcium 8.9 - 10.3 mg/dL 9.3 9.7 9.5  Total Protein 6.5 - 8.1 g/dL 7.3 8.1 7.9  Total Bilirubin 0.3 - 1.2 mg/dL 0.3 0.4 0.4  Alkaline  Phos 38 - 126 U/L 77 85 68  AST 15 - 41 U/L 16 23 37  ALT 0 - 44 U/L 23 41 87(H)      RADIOGRAPHIC STUDIES: I have personally reviewed the radiological images as listed and agreed with the findings in the report. No results found.   ASSESSMENT & PLAN: John Moon is a 49 y.o. male with   1. Right colon adenocarcinoma, pT3N2bM0, stage IIIB, MSI-stable -Diagnosed in 05/2015. S/p right hemicolectomy and adjuvant chemo CAPOX -Colonoscopy from 02/15/17 was normal, no samples taken. Repeat in 3 years  -CEA >5 at baseline, current smoker -on surveillance   2. Hepatic steatosis and transaminitis -mild intermittent transaminitis, likely benign, continue monitoring.  -09/09/17 CT scan shows steatosis -LFTs normal today  Disposition:  John Moon is clinically doing well.  Exam is benign.  CBC and CMP are normal.  The CEA was not drawn initially, he has been sent back to the lab after today's visit.  There is no clinical concern for colon cancer recurrence.  He is due for screening colonoscopy, I will  CC my note to Dr. Silverio Decamp.  He is 5 years out from his initial diagnosis, the recurrence risk is minimal.  He prefers to follow-up 1 more time in our clinic.  I have also referred him to establish with a primary care provider.  I encouraged him to adopt a healthy lifestyle, quit smoking, engage in physical exercise, and eat a healthy diet to reduce recurrence risk and risk of new malignancies.  If CEA is stable, follow-up in 1 year, or sooner if needed.  We reviewed signs and symptoms of cancer recurrence and call/return precautions.  He agrees to the flu shot today and the COVID-19 booster in 2-3 weeks.   Orders Placed This Encounter  Procedures  . Ambulatory referral to Internal Medicine    Referral Priority:   Routine    Referral Type:   Consultation    Referral Reason:   Specialty Services Required    Requested Specialty:   Internal Medicine    Number of Visits Requested:   1    All questions were answered. The patient knows to call the clinic with any problems, questions or concerns. No barriers to learning were detected.     Alla Feeling, NP 04/14/20

## 2020-04-14 ENCOUNTER — Inpatient Hospital Stay: Payer: Self-pay

## 2020-04-14 ENCOUNTER — Inpatient Hospital Stay (HOSPITAL_BASED_OUTPATIENT_CLINIC_OR_DEPARTMENT_OTHER): Payer: Self-pay | Admitting: Nurse Practitioner

## 2020-04-14 ENCOUNTER — Other Ambulatory Visit: Payer: Self-pay

## 2020-04-14 ENCOUNTER — Inpatient Hospital Stay: Payer: Self-pay | Attending: Nurse Practitioner

## 2020-04-14 ENCOUNTER — Encounter: Payer: Self-pay | Admitting: Nurse Practitioner

## 2020-04-14 ENCOUNTER — Telehealth: Payer: Self-pay | Admitting: Nurse Practitioner

## 2020-04-14 VITALS — BP 134/85 | HR 60 | Temp 98.1°F | Resp 20 | Ht 66.0 in | Wt 139.8 lb

## 2020-04-14 DIAGNOSIS — R911 Solitary pulmonary nodule: Secondary | ICD-10-CM | POA: Insufficient documentation

## 2020-04-14 DIAGNOSIS — K760 Fatty (change of) liver, not elsewhere classified: Secondary | ICD-10-CM | POA: Insufficient documentation

## 2020-04-14 DIAGNOSIS — C182 Malignant neoplasm of ascending colon: Secondary | ICD-10-CM | POA: Insufficient documentation

## 2020-04-14 DIAGNOSIS — Z87891 Personal history of nicotine dependence: Secondary | ICD-10-CM | POA: Insufficient documentation

## 2020-04-14 DIAGNOSIS — Z23 Encounter for immunization: Secondary | ICD-10-CM

## 2020-04-14 DIAGNOSIS — Z79899 Other long term (current) drug therapy: Secondary | ICD-10-CM | POA: Insufficient documentation

## 2020-04-14 DIAGNOSIS — D1803 Hemangioma of intra-abdominal structures: Secondary | ICD-10-CM | POA: Insufficient documentation

## 2020-04-14 LAB — COMPREHENSIVE METABOLIC PANEL
ALT: 23 U/L (ref 0–44)
AST: 16 U/L (ref 15–41)
Albumin: 4.2 g/dL (ref 3.5–5.0)
Alkaline Phosphatase: 77 U/L (ref 38–126)
Anion gap: 7 (ref 5–15)
BUN: 12 mg/dL (ref 6–20)
CO2: 25 mmol/L (ref 22–32)
Calcium: 9.3 mg/dL (ref 8.9–10.3)
Chloride: 107 mmol/L (ref 98–111)
Creatinine, Ser: 0.8 mg/dL (ref 0.61–1.24)
GFR, Estimated: >60 mL/min (ref >60)
Glucose, Bld: 123 mg/dL — ABNORMAL HIGH (ref 70–99)
Potassium: 3.5 mmol/L (ref 3.5–5.1)
Sodium: 139 mmol/L (ref 135–145)
Total Bilirubin: 0.3 mg/dL (ref 0.3–1.2)
Total Protein: 7.3 g/dL (ref 6.5–8.1)

## 2020-04-14 LAB — CBC WITH DIFFERENTIAL/PLATELET
Abs Immature Granulocytes: 0.04 10*3/uL (ref 0.00–0.07)
Basophils Absolute: 0.1 10*3/uL (ref 0.0–0.1)
Basophils Relative: 1 %
Eosinophils Absolute: 0.2 10*3/uL (ref 0.0–0.5)
Eosinophils Relative: 2 %
HCT: 44.2 % (ref 39.0–52.0)
Hemoglobin: 14.7 g/dL (ref 13.0–17.0)
Immature Granulocytes: 1 %
Lymphocytes Relative: 31 %
Lymphs Abs: 2.7 10*3/uL (ref 0.7–4.0)
MCH: 29.9 pg (ref 26.0–34.0)
MCHC: 33.3 g/dL (ref 30.0–36.0)
MCV: 89.8 fL (ref 80.0–100.0)
Monocytes Absolute: 0.7 10*3/uL (ref 0.1–1.0)
Monocytes Relative: 8 %
Neutro Abs: 5.1 10*3/uL (ref 1.7–7.7)
Neutrophils Relative %: 57 %
Platelets: 190 10*3/uL (ref 150–400)
RBC: 4.92 MIL/uL (ref 4.22–5.81)
RDW: 12.9 % (ref 11.5–15.5)
WBC: 8.8 10*3/uL (ref 4.0–10.5)
nRBC: 0 % (ref 0.0–0.2)

## 2020-04-14 LAB — CEA (IN HOUSE-CHCC): CEA (CHCC-In House): 8.95 ng/mL — ABNORMAL HIGH (ref 0.00–5.00)

## 2020-04-14 MED ORDER — INFLUENZA VAC SPLIT QUAD 0.5 ML IM SUSY
PREFILLED_SYRINGE | INTRAMUSCULAR | Status: AC
  Filled 2020-04-14: qty 0.5

## 2020-04-14 MED ORDER — INFLUENZA VAC SPLIT QUAD 0.5 ML IM SUSY
0.5000 mL | PREFILLED_SYRINGE | Freq: Once | INTRAMUSCULAR | Status: AC
  Administered 2020-04-14: 0.5 mL via INTRAMUSCULAR

## 2020-04-14 NOTE — Telephone Encounter (Signed)
Scheduled appointments per 10/25 los. Spoke to patient who is aware of appointment date and time.

## 2020-04-23 ENCOUNTER — Telehealth: Payer: Self-pay

## 2020-04-23 ENCOUNTER — Inpatient Hospital Stay: Payer: Self-pay | Attending: Nurse Practitioner

## 2020-04-23 NOTE — Telephone Encounter (Signed)
-----   Message from Mauri Pole, MD sent at 04/23/2020  4:47 PM EDT ----- [name], please bring him for office visit with APP soon. Thanks ----- Message ----- From: Alla Feeling, NP Sent: 04/14/2020   1:01 PM EDT To: Mauri Pole, MD  Dr. Silverio Decamp,  He doing very well from cancer standpoint, 5 years from initial diagnosis. He is overdue for screening colonoscopy, last done 01/2017. Please see him back to discuss.   Thanks, Regan Rakers, NP

## 2020-04-24 ENCOUNTER — Other Ambulatory Visit: Payer: Self-pay

## 2020-04-24 NOTE — Telephone Encounter (Signed)
Called. No answer. No voicemail. Called the daughter. No answer. No voicemail. Due for recall. Can someone send the recall letter to have him schedule an office visit please?

## 2020-05-29 ENCOUNTER — Other Ambulatory Visit: Payer: Self-pay

## 2020-07-29 ENCOUNTER — Encounter: Payer: Self-pay | Admitting: Gastroenterology

## 2020-08-12 ENCOUNTER — Telehealth: Payer: Self-pay | Admitting: Nurse Practitioner

## 2020-08-12 NOTE — Telephone Encounter (Signed)
Left message with moved appointment due to provider's template. Gave option to call back to reschedule if needed.

## 2021-01-19 ENCOUNTER — Encounter (HOSPITAL_COMMUNITY): Payer: Self-pay

## 2021-01-19 ENCOUNTER — Ambulatory Visit (HOSPITAL_COMMUNITY)
Admission: EM | Admit: 2021-01-19 | Discharge: 2021-01-19 | Disposition: A | Discharge status: Home / Self Care | Payer: BC Managed Care – PPO | Attending: Internal Medicine | Admitting: Internal Medicine

## 2021-01-19 ENCOUNTER — Other Ambulatory Visit: Payer: Self-pay

## 2021-01-19 ENCOUNTER — Ambulatory Visit (INDEPENDENT_AMBULATORY_CARE_PROVIDER_SITE_OTHER): Payer: BC Managed Care – PPO

## 2021-01-19 DIAGNOSIS — M25562 Pain in left knee: Secondary | ICD-10-CM | POA: Diagnosis not present

## 2021-01-19 DIAGNOSIS — M25462 Effusion, left knee: Secondary | ICD-10-CM

## 2021-01-19 MED ORDER — IBUPROFEN 600 MG PO TABS: 600.0000 mg | ORAL_TABLET | Freq: Four times a day (QID) | ORAL | 0 refills | Status: DC | PRN

## 2021-01-19 MED ORDER — KETOROLAC TROMETHAMINE 30 MG/ML IJ SOLN
INTRAMUSCULAR | Status: AC
  Filled 2021-01-19: qty 1

## 2021-01-19 MED ORDER — KETOROLAC TROMETHAMINE 30 MG/ML IJ SOLN
30.0000 mg | Freq: Once | INTRAMUSCULAR | Status: AC
  Administered 2021-01-19: 30 mg via INTRAMUSCULAR

## 2021-01-19 NOTE — ED Triage Notes (Signed)
Pt reports Lt knee pain for one week.

## 2021-01-19 NOTE — ED Triage Notes (Signed)
Pt c/o left knee pain, and swelling

## 2021-01-19 NOTE — Discharge Instructions (Addendum)
Rest, elevation of the left leg recommended Icing of the left knee recommended Take medications as prescribed Gentle range of motion exercises No fracture seen on x-ray.

## 2021-03-13 NOTE — ED Provider Notes (Signed)
[name]    CSN: [id number] Arrival date & time: 01/19/21  1906      History   Chief Complaint Chief Complaint  Patient presents with   Knee Pain    HPI John Moon is a 50 y.o. male comes to the urgent care with 1 week of left knee swelling with pain.  Symptoms started 1 week ago and has been persistent.  Patient has worsening swelling at the end of workday.  He denies any trauma or falls.  No fever or chills.  No redness around the knee.  This is the first episode that the patient has had.  Pain is currently severe, constant, throbbing, aggravated by walking and denies any relieving factors.  HPI  Past Medical History:  Diagnosis Date   Colon cancer [facility])    colon cancer chemo only    Patient Active Problem List   Diagnosis Date Noted   Genetic testing 09/12/2015   Cancer of right colon ([place]) 06/15/2015   Aspiration into airway    Benign neoplasm of colon    Colitis 06/09/2015    Past Surgical History:  Procedure Laterality Date   COLONOSCOPY     COLONOSCOPY WITH PROPOFOL N/A 06/10/2015   Procedure: COLONOSCOPY WITH PROPOFOL;  Surgeon: Mauri Pole, MD;  [facility];  Service: Endoscopy;  Laterality: N/A;   LAPAROSCOPIC PARTIAL COLECTOMY N/A 06/12/2015   Procedure: LAPAROSCOPIC ASSISTED  PARTIAL COLECTOMY;  Surgeon: Autumn Messing III, MD;  Location: [facility];  Service: General;  Laterality: N/A;   PARTIAL COLECTOMY  06/12/2015   Procedure: PARTIAL COLECTOMY;  Surgeon: Autumn Messing III, MD;  Location: [facility];  Service: General;;       Home Medications    Prior to Admission medications   Medication Sig Start Date End Date Taking? Authorizing Provider  ibuprofen (ADVIL) 600 MG tablet Take 1 tablet (600 mg total) by mouth every 6 (six) hours as needed. 01/19/21  Yes Elvin Banker, Myrene Galas, MD    Family History Family History  Problem Relation Age of Onset   Colon cancer Neg Hx    Colon polyps Neg Hx    Esophageal cancer Neg Hx    Rectal  cancer Neg Hx    Stomach cancer Neg Hx     Social History Social History   Tobacco Use   Smoking status: Every Day    Packs/day: 0.50    Types: Cigarettes   Smokeless tobacco: Never  Vaping Use   Vaping Use: Never used  Substance Use Topics   Alcohol use: No   Drug use: No     Allergies   Patient has no known allergies.   Review of Systems Review of Systems  Constitutional:  Negative for chills and fever.  Musculoskeletal:  Positive for arthralgias and joint swelling. Negative for back pain, myalgias and neck pain.  Neurological: Negative.     Physical Exam Triage Vital Signs ED Triage Vitals  Enc Vitals Group     BP 01/19/21 2029 (!) 145/90     Pulse Rate 01/19/21 2029 78     Resp 01/19/21 2029 20     Temp 01/19/21 2029 98.4 F (36.9 C)     Temp Source 01/19/21 2029 Oral     SpO2 01/19/21 2029 97 %     Weight --      Height --      Head Circumference --      Peak Flow --      Pain Score 01/19/21  2027 10     Pain Loc --      Pain Edu? --      Excl. in [place]? --    No data found.  Updated Vital Signs BP (!) 145/90 (BP Location: Left Arm)   Pulse 78   Temp 98.4 F (36.9 C) (Oral)   Resp 20   SpO2 97%   Visual Acuity Right Eye Distance:   Left Eye Distance:   Bilateral Distance:    Right Eye Near:   Left Eye Near:    Bilateral Near:     Physical Exam Vitals and nursing note reviewed.  Cardiovascular:     Rate and Rhythm: Normal rate and regular rhythm.  Pulmonary:     Effort: Pulmonary effort is normal.     Breath sounds: Normal breath sounds.  Musculoskeletal:     Comments: Painful range of motion of the left knee.  Patella is ballotable.  No erythema.  No rash or discharge.  Skin:    General: Skin is warm and dry.     UC Treatments / Results  Labs (all labs ordered are listed, but only abnormal results are displayed) Labs Reviewed - No data to display  EKG   Radiology No results found.  Procedures Procedures (including  critical care time)  Medications Ordered in UC Medications  ketorolac (TORADOL) 30 MG/ML injection 30 mg (30 mg Intramuscular Given 01/19/21 2121)    Initial Impression / Assessment and Plan / UC Course  I have reviewed the triage vital signs and the nursing notes.  Pertinent labs & imaging results that were available during my care of the patient were reviewed by me and considered in my medical decision making (see chart for details).     1.  Effusion of the left knee: Icing of the left knee recommended Ibuprofen every 6 hours as needed for pain Gentle range of motion exercises Return precautions given X-rays negative for acute fracture.  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 Final Clinical Impressions(s) / UC Diagnoses   Final diagnoses:  Effusion of left knee     Discharge Instructions      Rest, elevation of the left leg recommended Icing of the left knee recommended Take medications as prescribed Gentle range of motion exercises No fracture seen on x-ray.   ED Prescriptions     Medication Sig Dispense Auth. Provider   ibuprofen (ADVIL) 600 MG tablet Take 1 tablet (600 mg total) by mouth every 6 (six) hours as needed. 30 tablet Mikaia Janvier, Myrene Galas, MD      PDMP not reviewed this encounter.   Chase Picket, MD 03/13/21 [phone]

## 2021-04-13 NOTE — Progress Notes (Deleted)
[place]   Telephone:(336) [phone] Fax:(336) [phone]   Clinic Follow up Note   Patient Care Team: Patient, No Pcp Per (Inactive) as PCP - General (General Practice) 04/13/2021  CHIEF COMPLAINT: Follow up stage III right colon cancer   SUMMARY OF ONCOLOGIC HISTORY: Oncology History Overview Note  Cancer of right colon [facility])   Staging form: Colon and Rectum, AJCC 7th Edition     Pathologic stage from 06/12/2015: Stage IIIC (T3, N2b, cM0) - Signed by Truitt Merle, MD on 07/07/2015     Cancer of right colon ([place])  06/09/2015 Imaging   CT chest abdomen and pelvis showed proximal and a transverse colitis, fluid-filled and dilated transverse colon indicating of obstruction related to a mass at the junction of the transverse and descending colon. trace left pleural effusion.    06/10/2015 Initial Diagnosis   Cancer of right colon ([place])   06/10/2015 Procedure   Colonoscopy showed a friable ulcerated mucosa in distal to mid transverse with stricture, scope couldn't be advanced beyond the. 11 sessile polyps removed.   06/12/2015 Surgery   Right hemicolectomy and lymph node dissection.   06/12/2015 Pathology Results   Moderately differentiated colonic adenocarcinoma, 3 cm, G2, located in the transverse colon, LVI(-), perineural invasion (-), T3, margins were negative, 7 out of 30 lymph nodes were positive, polyps were tubular adenoma.   07/31/2015 - 01/06/2016 Chemotherapy   adjuvant chemo CAPEOX, oxaliplatin 130 mg/m, Xeloda 1000 mg/m on day 1-14, every 21 days, S/P 8 cycles    08/16/2016 Imaging   CT CAP w/ CONTRAST 08/16/16 IMPRESSION: 1. No evidence metastatic disease in the chest, abdomen or pelvis. 2. Subpleural nodularity at both lung bases is stable from 2016, probably subpleural lymph nodes. 3. Mild hepatic steatosis. 4. Bilateral L5 pars defects.   02/16/2017 Procedure   Colonoscopy with Dr. Silverio Decamp on 02/16/17 IMPRESSION - Patent end-to-side ileo-colonic  anastomosis, characterized by healthy appearing mucosa. - The examination was otherwise normal. - No specimens collected.   09/08/2017 Imaging   CT CAP W Contrast 09/08/17 IMPRESSION: 1. There is a new 0.8 cm subcapsular enhancing lesion within the right hepatic lobe which is indeterminate on current exam. While this may represent a benign lesion, recommend further evaluation with abdominal MRI for definitive characterization. 2. Transient hepatic perfusion within the left hepatic lobe which is likely benign in etiology. Recommend attention on abdominal MRI. 3. New 2 mm right lower lobe nodule. Additional nodules are stable. Recommend attention on follow-up. 4. Hepatic steatosis.   05/15/2018 Imaging   MRI abdomen  IMPRESSION: 1. No acute findings within the abdomen. 2. The enhancing lesion within the subcapsular right hepatic lobe is again noted. Imaging characteristics are favored to represent a benign abnormality such as a liver hemangioma. This is remains stable compared with 09/08/2017. 3. Hepatic steatosis with areas of focal fatty sparing adjacent to the gallbladder fossa.     CURRENT THERAPY: Surveillance   INTERVAL HISTORY: John Moon returns for follow up as scheduled. Last seen by me 04/14/20.    REVIEW OF SYSTEMS:   Constitutional: Denies fevers, chills or abnormal weight loss Eyes: Denies blurriness of vision Ears, nose, mouth, throat, and face: Denies mucositis or sore throat Respiratory: Denies cough, dyspnea or wheezes Cardiovascular: Denies palpitation, chest discomfort or lower extremity swelling Gastrointestinal:  Denies nausea, heartburn or change in bowel habits Skin: Denies abnormal skin rashes Lymphatics: Denies new lymphadenopathy or easy bruising Neurological:Denies numbness, tingling or new weaknesses Behavioral/Psych: Mood is stable, no new changes  All other systems were reviewed with the patient and are negative.  MEDICAL HISTORY:  Past  Medical History:  Diagnosis Date   Colon cancer ([place])    colon cancer chemo only    SURGICAL HISTORY: Past Surgical History:  Procedure Laterality Date   COLONOSCOPY     COLONOSCOPY WITH PROPOFOL N/A 06/10/2015   Procedure: COLONOSCOPY WITH PROPOFOL;  Surgeon: Mauri Pole, MD;  [facility];  Service: Endoscopy;  Laterality: N/A;   LAPAROSCOPIC PARTIAL COLECTOMY N/A 06/12/2015   Procedure: LAPAROSCOPIC ASSISTED  PARTIAL COLECTOMY;  Surgeon: Autumn Messing III, MD;  [facility]: [facility];  Service: General;  Laterality: N/A;   PARTIAL COLECTOMY  06/12/2015   Procedure: PARTIAL COLECTOMY;  Surgeon: Autumn Messing III, MD;  Location: [facility];  Service: General;;    I have reviewed the social history and family history with the patient and they are unchanged from previous note.  ALLERGIES:  has No Known Allergies.  MEDICATIONS:  Current Outpatient Medications  Medication Sig Dispense Refill   ibuprofen (ADVIL) 600 MG tablet Take 1 tablet (600 mg total) by mouth every 6 (six) hours as needed. 30 tablet 0   No current facility-administered medications for this visit.    PHYSICAL EXAMINATION: ECOG PERFORMANCE STATUS: {CHL ONC ECOG [phone]}  There were no vitals filed for this visit. There were no vitals filed for this visit.  GENERAL:alert, no distress and comfortable SKIN: skin color, texture, turgor are normal, no rashes or significant lesions EYES: normal, Conjunctiva are pink and non-injected, sclera clear OROPHARYNX:no exudate, no erythema and lips, buccal mucosa, and tongue normal  NECK: supple, thyroid normal size, non-tender, without nodularity LYMPH:  no palpable lymphadenopathy in the cervical, axillary or inguinal LUNGS: clear to auscultation and percussion with normal breathing effort HEART: regular rate & rhythm and no murmurs and no lower extremity edema ABDOMEN:abdomen soft, non-tender and normal bowel sounds Musculoskeletal:no cyanosis of digits and no  clubbing  NEURO: alert & oriented x 3 with fluent speech, no focal motor/sensory deficits  LABORATORY DATA:  I have reviewed the data as listed CBC Latest Ref Rng & Units 04/14/2020 04/04/2019 10/02/2018  WBC 4.0 - 10.5 K/uL 8.8 9.1 10.0  Hemoglobin 13.0 - 17.0 g/dL 14.7 15.6 14.9  Hematocrit 39.0 - 52.0 % 44.2 46.3 44.5  Platelets 150 - 400 K/uL 190 206 203     CMP Latest Ref Rng & Units 04/14/2020 04/04/2019 10/02/2018  Glucose 70 - 99 mg/dL 123(H) 92 113(H)  BUN 6 - 20 mg/dL 12 11 10   Creatinine 0.61 - 1.24 mg/dL 0.80 0.79 0.81  Sodium 135 - 145 mmol/L 139 140 142  Potassium 3.5 - 5.1 mmol/L 3.5 4.0 3.5  Chloride 98 - 111 mmol/L 107 105 106  CO2 22 - 32 mmol/L 25 23 25   Calcium 8.9 - 10.3 mg/dL 9.3 9.7 9.5  Total Protein 6.5 - 8.1 g/dL 7.3 8.1 7.9  Total Bilirubin 0.3 - 1.2 mg/dL 0.3 0.4 0.4  Alkaline Phos 38 - 126 U/L 77 85 68  AST 15 - 41 U/L 16 23 37  ALT 0 - 44 U/L 23 41 87(H)      RADIOGRAPHIC STUDIES: I have personally reviewed the radiological images as listed and agreed with the findings in the report. No results found.   ASSESSMENT & PLAN:  No problem-specific Assessment & Plan notes found for this encounter.   No orders of the defined types were placed in this encounter.  All questions were answered. The patient knows  to call the clinic with any problems, questions or concerns. No barriers to learning was detected. I spent {CHL ONC TIME VISIT - GFREV:[id number]} counseling the patient face to face. The total time spent in the appointment was {CHL ONC TIME VISIT - FJUVQ:[id number]} and more than 50% was on counseling and review of test results     Alla Feeling, NP 04/13/21

## 2021-04-14 ENCOUNTER — Inpatient Hospital Stay: Payer: BC Managed Care – PPO | Attending: Nurse Practitioner

## 2021-04-14 ENCOUNTER — Inpatient Hospital Stay: Payer: BC Managed Care – PPO | Admitting: Nurse Practitioner

## 2021-04-14 ENCOUNTER — Ambulatory Visit: Payer: Self-pay | Admitting: Nurse Practitioner

## 2021-04-14 ENCOUNTER — Other Ambulatory Visit: Payer: Self-pay

## 2021-04-28 ENCOUNTER — Telehealth: Payer: Self-pay

## 2021-04-28 NOTE — Telephone Encounter (Signed)
Colonoscopy reminder letter mailed out

## 2021-04-28 NOTE — Telephone Encounter (Signed)
-----   Message from Alla Feeling, NP sent at 04/28/2021  1:39 PM EST ----- Ok thank you, we will also try to r/s him in person or virtually.   Lacie  ----- Message ----- From: Mauri Pole, MD Sent: 04/24/2021   4:30 PM EST To: Greggory Keen, LPN, Alla Feeling, NP, #  Unfortunately he was sent reminder letters and he hasn't scheduled colonoscopy.  Beth, please send him a letter again to schedule colonoscopy, he is past due Thanks  ----- Message ----- From: Alla Feeling, NP Sent: 04/14/2021   1:57 PM EDT To: Mauri Pole, MD, [facility]  This gentleman is >5 years from colon cancer diagnosis, did not show for f/up today. It's ok if he does not want to r/s but needs to be followed by PCP/GI. He appears overdue for recall colonoscopy due in 2021.   My nurse, Could you please call pt to offer to r/s with me, or at least get scheduled with Dr. Silverio Decamp.   Thanks, Regan Rakers

## 2021-04-29 ENCOUNTER — Telehealth: Payer: Self-pay | Admitting: Adult Health

## 2021-04-29 NOTE — Telephone Encounter (Signed)
Scheduled per sch msg. Called with interpreter, left msg

## 2021-05-27 NOTE — Progress Notes (Deleted)
[facility] Follow up:    Patient, No Pcp Per (Inactive) No address on file   DIAGNOSIS: Cancer Staging  Cancer of right colon [facility]) Staging form: Colon and Rectum, AJCC 7th Edition - Pathologic stage from 06/12/2015: Stage IIIC (T3, N2b, cM0) - Signed by Truitt Merle, MD on 07/07/2015 Laterality: Right Histologic grade (G): G2 Residual tumor (R): R0 - None   SUMMARY OF ONCOLOGIC HISTORY: Oncology History Overview Note  Cancer of right colon [facility])   Staging form: Colon and Rectum, AJCC 7th Edition     Pathologic stage from 06/12/2015: Stage IIIC (T3, N2b, cM0) - Signed by Truitt Merle, MD on 07/07/2015     Cancer of right colon ([place])  06/09/2015 Imaging   CT chest abdomen and pelvis showed proximal and a transverse colitis, fluid-filled and dilated transverse colon indicating of obstruction related to a mass at the junction of the transverse and descending colon. trace left pleural effusion.    06/10/2015 Initial Diagnosis   Cancer of right colon ([place])   06/10/2015 Procedure   Colonoscopy showed a friable ulcerated mucosa in distal to mid transverse with stricture, scope couldn't be advanced beyond the. 11 sessile polyps removed.   06/12/2015 Surgery   Right hemicolectomy and lymph node dissection.   06/12/2015 Pathology Results   Moderately differentiated colonic adenocarcinoma, 3 cm, G2, located in the transverse colon, LVI(-), perineural invasion (-), T3, margins were negative, 7 out of 30 lymph nodes were positive, polyps were tubular adenoma.   07/31/2015 - 01/06/2016 Chemotherapy   adjuvant chemo CAPEOX, oxaliplatin 130 mg/m, Xeloda 1000 mg/m on day 1-14, every 21 days, S/P 8 cycles    08/16/2016 Imaging   CT CAP w/ CONTRAST 08/16/16 IMPRESSION: 1. No evidence metastatic disease in the chest, abdomen or pelvis. 2. Subpleural nodularity at both lung bases is stable from 2016, probably subpleural lymph nodes. 3. Mild hepatic steatosis. 4. Bilateral L5  pars defects.   02/16/2017 Procedure   Colonoscopy with Dr. Silverio Decamp on 02/16/17 IMPRESSION - Patent end-to-side ileo-colonic anastomosis, characterized by healthy appearing mucosa. - The examination was otherwise normal. - No specimens collected.   09/08/2017 Imaging   CT CAP W Contrast 09/08/17 IMPRESSION: 1. There is a new 0.8 cm subcapsular enhancing lesion within the right hepatic lobe which is indeterminate on current exam. While this may represent a benign lesion, recommend further evaluation with abdominal MRI for definitive characterization. 2. Transient hepatic perfusion within the left hepatic lobe which is likely benign in etiology. Recommend attention on abdominal MRI. 3. New 2 mm right lower lobe nodule. Additional nodules are stable. Recommend attention on follow-up. 4. Hepatic steatosis.   05/15/2018 Imaging   MRI abdomen  IMPRESSION: 1. No acute findings within the abdomen. 2. The enhancing lesion within the subcapsular right hepatic lobe is again noted. Imaging characteristics are favored to represent a benign abnormality such as a liver hemangioma. This is remains stable compared with 09/08/2017. 3. Hepatic steatosis with areas of focal fatty sparing adjacent to the gallbladder fossa.     CURRENT THERAPY: Observation  INTERVAL HISTORY: John Moon 50 y.o. male returns for evaluation and follow-up of his right-sided colon cancer that was stage IIIc.  We received a message from his gastrointestinal doctor that he is overdue for his colonoscopy.   Patient Active Problem List   Diagnosis Date Noted   Genetic testing 09/12/2015   Cancer of right colon ([place]) 06/15/2015   Aspiration into airway    Benign neoplasm of  colon    Colitis 06/09/2015    has No Known Allergies.  MEDICAL HISTORY: Past Medical History:  Diagnosis Date   Colon cancer ([place])    colon cancer chemo only    SURGICAL HISTORY: Past Surgical History:  Procedure Laterality Date    COLONOSCOPY     COLONOSCOPY WITH PROPOFOL N/A 06/10/2015   Procedure: COLONOSCOPY WITH PROPOFOL;  Surgeon: Mauri Pole, MD;  Location: [facility];  Service: Endoscopy;  Laterality: N/A;   LAPAROSCOPIC PARTIAL COLECTOMY N/A 06/12/2015   Procedure: LAPAROSCOPIC ASSISTED  PARTIAL COLECTOMY;  Surgeon: Autumn Messing III, MD;  Location: [facility];  Service: General;  Laterality: N/A;   PARTIAL COLECTOMY  06/12/2015   Procedure: PARTIAL COLECTOMY;  Surgeon: Autumn Messing III, MD;  Location: [facility];  Service: General;;    SOCIAL HISTORY: Social History   Socioeconomic History   Marital status: Single    Spouse name: Not on file   Number of children: 2   Years of education: Not on file   Highest education level: Not on file  Occupational History   Not on file  Tobacco Use   Smoking status: Every Day    Packs/day: 0.50    Types: Cigarettes   Smokeless tobacco: Never  Vaping Use   Vaping Use: Never used  Substance and Sexual Activity   Alcohol use: No   Drug use: No   Sexual activity: Never  Other Topics Concern   Not on file  Social History Narrative   Married, wife Tang   Has #2 children-boy and girl (85 & 78)   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   Limited English skills-vietnamese   Has half brother, Kartik Fernando   Social Determinants of Health   Financial Resource Strain: Not on file  Food Insecurity: Not on file  Transportation Needs: Not on file  Physical Activity: Not on file  Stress: Not on file  Social Connections: Not on file  Intimate Partner Violence: Not on file    FAMILY HISTORY: Family History  Problem Relation Age of Onset   Colon cancer Neg Hx    Colon polyps Neg Hx    Esophageal cancer Neg Hx    Rectal cancer Neg Hx    Stomach cancer Neg Hx     Review of Systems  Constitutional:  Negative for appetite change, chills, fatigue, fever and unexpected weight change.  HENT:   Negative for hearing loss, lump/mass and trouble  swallowing.   Eyes:  Negative for eye problems and icterus.  Respiratory:  Negative for chest tightness, cough and shortness of breath.   Cardiovascular:  Negative for chest pain, leg swelling and palpitations.  Gastrointestinal:  Negative for abdominal distention, abdominal pain, constipation, diarrhea, nausea and vomiting.  Endocrine: Negative for hot flashes.  Genitourinary:  Negative for difficulty urinating.   Musculoskeletal:  Negative for arthralgias.  Skin:  Negative for itching and rash.  Neurological:  Negative for dizziness, extremity weakness, headaches and numbness.  Hematological:  Negative for adenopathy. Does not bruise/bleed easily.  Psychiatric/Behavioral:  Negative for depression. The patient is not nervous/anxious.      PHYSICAL EXAMINATION  ECOG PERFORMANCE STATUS: {CHL ONC ECOG PS:[id number]}  There were no vitals filed for this visit.  Physical Exam Constitutional:      General: He is not in acute distress.    Appearance: Normal appearance. He is not toxic-appearing.  HENT:     Head: Normocephalic and atraumatic.  Eyes:  General: No scleral icterus. Cardiovascular:     Rate and Rhythm: Normal rate and regular rhythm.     Pulses: Normal pulses.     Heart sounds: Normal heart sounds.  Pulmonary:     Effort: Pulmonary effort is normal.     Breath sounds: Normal breath sounds.  Abdominal:     General: Abdomen is flat. Bowel sounds are normal. There is no distension.     Palpations: Abdomen is soft.     Tenderness: There is no abdominal tenderness.  Musculoskeletal:        General: No swelling.     Cervical back: Neck supple.  Lymphadenopathy:     Cervical: No cervical adenopathy.  Skin:    General: Skin is warm and dry.     Findings: No rash.  Neurological:     General: No focal deficit present.     Mental Status: He is alert.  Psychiatric:        Mood and Affect: Mood normal.        Behavior: Behavior normal.    LABORATORY DATA:  CBC     Component Value Date/Time   WBC 8.8 04/14/2020 1124   RBC 4.92 04/14/2020 1124   HGB 14.7 04/14/2020 1124   HGB 15.9 01/14/2017 0942   HCT 44.2 04/14/2020 1124   HCT 46.7 01/14/2017 0942   PLT 190 04/14/2020 1124   PLT 200 01/14/2017 0942   MCV 89.8 04/14/2020 1124   MCV 89.6 01/14/2017 0942   MCH 29.9 04/14/2020 1124   MCHC 33.3 04/14/2020 1124   RDW 12.9 04/14/2020 1124   RDW 13.5 01/14/2017 0942   LYMPHSABS 2.7 04/14/2020 1124   LYMPHSABS 4.1 (H) 01/14/2017 0942   MONOABS 0.7 04/14/2020 1124   MONOABS 0.7 01/14/2017 0942   EOSABS 0.2 04/14/2020 1124   EOSABS 0.3 01/14/2017 0942   BASOSABS 0.1 04/14/2020 1124   BASOSABS 0.1 01/14/2017 0942    CMP     Component Value Date/Time   NA 139 04/14/2020 1124   NA 140 01/14/2017 0942   K 3.5 04/14/2020 1124   K 3.6 01/14/2017 0942   CL 107 04/14/2020 1124   CO2 25 04/14/2020 1124   CO2 25 01/14/2017 0942   GLUCOSE 123 (H) 04/14/2020 1124   GLUCOSE 93 01/14/2017 0942   BUN 12 04/14/2020 1124   BUN 6.3 (L) 01/14/2017 0942   CREATININE 0.80 04/14/2020 1124   CREATININE 0.81 10/02/2018 1232   CREATININE 0.8 01/14/2017 0942   CALCIUM 9.3 04/14/2020 1124   CALCIUM 10.0 01/14/2017 0942   PROT 7.3 04/14/2020 1124   PROT 8.2 01/14/2017 0942   ALBUMIN 4.2 04/14/2020 1124   ALBUMIN 4.4 01/14/2017 0942   AST 16 04/14/2020 1124   AST 37 10/02/2018 1232   AST 34 01/14/2017 0942   ALT 23 04/14/2020 1124   ALT 87 (H) 10/02/2018 1232   ALT 80 (H) 01/14/2017 0942   ALKPHOS 77 04/14/2020 1124   ALKPHOS 97 01/14/2017 0942   BILITOT 0.3 04/14/2020 1124   BILITOT 0.4 10/02/2018 1232   BILITOT 0.44 01/14/2017 0942   GFRNONAA >60 04/14/2020 1124   GFRNONAA >60 10/02/2018 1232   GFRNONAA >89 03/11/2015 1926   GFRAA >60 04/04/2019 1224   GFRAA >60 10/02/2018 1232   GFRAA >89 03/11/2015 1926       PENDING LABS:   RADIOGRAPHIC STUDIES:  No results found.   PATHOLOGY:     ASSESSMENT and THERAPY PLAN:   No  problem-specific Assessment & Plan  notes found for this encounter.   No orders of the defined types were placed in this encounter.   All questions were answered. The patient knows to call the clinic with any problems, questions or concerns. We can certainly see the patient much sooner if necessary. This note was electronically signed. Scot Dock, NP 05/27/2021

## 2021-05-28 ENCOUNTER — Other Ambulatory Visit: Payer: BC Managed Care – PPO

## 2021-05-28 ENCOUNTER — Telehealth: Payer: Self-pay

## 2021-05-28 ENCOUNTER — Other Ambulatory Visit: Payer: Self-pay

## 2021-05-28 ENCOUNTER — Inpatient Hospital Stay: Payer: BC Managed Care – PPO | Admitting: Adult Health

## 2021-05-28 DIAGNOSIS — C182 Malignant neoplasm of ascending colon: Secondary | ICD-10-CM

## 2021-05-28 NOTE — Telephone Encounter (Signed)
Attempt x3 to contact pt regarding appointment this morning for labwork and to see Mendel Ryder.  No answer

## 2021-05-28 NOTE — Telephone Encounter (Signed)
Scheduling message sent to scheduling team to reschedule appointments for labs and provider.

## 2021-06-02 NOTE — Progress Notes (Signed)
John Moon   [phone] [phone]   Clinic Follow up Note   Patient Care Team: Patient, No Pcp Per (Inactive) as PCP - General (General Practice) 06/03/2021  CHIEF COMPLAINT: Follow-up stage III right colon cancer  SUMMARY OF ONCOLOGIC HISTORY: Oncology History Overview Note  Cancer of right colon [facility])   Staging form: Colon and Rectum, AJCC 7th Edition     Pathologic stage from 06/12/2015: Stage IIIC (T3, N2b, cM0) - Signed by Truitt Merle, MD on 07/07/2015     Cancer of right colon ([place])  06/09/2015 Imaging   CT chest abdomen and pelvis showed proximal and a transverse colitis, fluid-filled and dilated transverse colon indicating of obstruction related to a mass at the junction of the transverse and descending colon. trace left pleural effusion.    06/10/2015 Initial Diagnosis   Cancer of right colon ([place])   06/10/2015 Procedure   Colonoscopy showed a friable ulcerated mucosa in distal to mid transverse with stricture, scope couldn't be advanced beyond the. 11 sessile polyps removed.   06/12/2015 Surgery   Right hemicolectomy and lymph node dissection.   06/12/2015 Pathology Results   Moderately differentiated colonic adenocarcinoma, 3 cm, G2, located in the transverse colon, LVI(-), perineural invasion (-), T3, margins were negative, 7 out of 30 lymph nodes were positive, polyps were tubular adenoma.   07/31/2015 - 01/06/2016 Chemotherapy   adjuvant chemo CAPEOX, oxaliplatin 130 mg/m, Xeloda 1000 mg/m on day 1-14, every 21 days, S/P 8 cycles    08/16/2016 Imaging   CT CAP w/ CONTRAST 08/16/16 IMPRESSION: 1. No evidence metastatic disease in the chest, abdomen or pelvis. 2. Subpleural nodularity at both lung bases is stable from 2016, probably subpleural lymph nodes. 3. Mild hepatic steatosis. 4. Bilateral L5 pars defects.   02/16/2017 Procedure   Colonoscopy with Dr. Silverio Decamp on 02/16/17 IMPRESSION - Patent end-to-side ileo-colonic  anastomosis, characterized by healthy appearing mucosa. - The examination was otherwise normal. - No specimens collected.   09/08/2017 Imaging   CT CAP W Contrast 09/08/17 IMPRESSION: 1. There is a new 0.8 cm subcapsular enhancing lesion within the right hepatic lobe which is indeterminate on current exam. While this may represent a benign lesion, recommend further evaluation with abdominal MRI for definitive characterization. 2. Transient hepatic perfusion within the left hepatic lobe which is likely benign in etiology. Recommend attention on abdominal MRI. 3. New 2 mm right lower lobe nodule. Additional nodules are stable. Recommend attention on follow-up. 4. Hepatic steatosis.   05/15/2018 Imaging   MRI abdomen  IMPRESSION: 1. No acute findings within the abdomen. 2. The enhancing lesion within the subcapsular right hepatic lobe is again noted. Imaging characteristics are favored to represent a benign abnormality such as a liver hemangioma. This is remains stable compared with 09/08/2017. 3. Hepatic steatosis with areas of focal fatty sparing adjacent to the gallbladder fossa.     CURRENT THERAPY: Surveillance  INTERVAL HISTORY: [name] returns for follow-up, last seen by me 03/2020 for routine surveillance visit.  He preferred to follow-up one more time in our clinic but has rescheduled multiple visits recently.  He has not seen Dr. Silverio Decamp.  Last colonoscopy 02/16/2017 was unremarkable, he was on 3-year recall and is overdue.  He also did not establish with a PCP.   He presents today with a [place] interpreter, reports feeling well overall, denies changes in his general health.  Main concern is left knee pain for which he takes Advil as needed.  Denies residual side  effects from previous chemo.  He continues to smoke "a little bit," does not drink much alcohol.  He eats a healthy diet and remains active, denies unintentional weight loss.  Bowels fluctuate from constipation  to diarrhea which is his baseline since colon surgery.  Denies bloody or black stool, abdominal pain or bloating, recent infection, dyspnea, or any other new specific complaints.  All other systems were reviewed with the patient and are negative.  MEDICAL HISTORY:  Past Medical History:  Diagnosis Date   Colon cancer ([place])    colon cancer chemo only    SURGICAL HISTORY: Past Surgical History:  Procedure Laterality Date   COLONOSCOPY     COLONOSCOPY WITH PROPOFOL N/A 06/10/2015   Procedure: COLONOSCOPY WITH PROPOFOL;  Surgeon: Mauri Pole, MD;  [facility];  Service: Endoscopy;  Laterality: N/A;   LAPAROSCOPIC PARTIAL COLECTOMY N/A 06/12/2015   Procedure: LAPAROSCOPIC ASSISTED  PARTIAL COLECTOMY;  Surgeon: Autumn Messing III, MD;  Location: [facility];  Service: General;  Laterality: N/A;   PARTIAL COLECTOMY  06/12/2015   Procedure: PARTIAL COLECTOMY;  Surgeon: Autumn Messing III, MD;  Location: [facility];  Service: General;;    I have reviewed the social history and family history with the patient and they are unchanged from previous note.  ALLERGIES:  has No Known Allergies.  MEDICATIONS:  Current Outpatient Medications  Medication Sig Dispense Refill   ibuprofen (ADVIL) 600 MG tablet Take 1 tablet (600 mg total) by mouth every 6 (six) hours as needed. 30 tablet 0   No current facility-administered medications for this visit.    PHYSICAL EXAMINATION: ECOG PERFORMANCE STATUS: 0 - Asymptomatic  Vitals:   06/03/21 0858  BP: (!) 148/83  Pulse: 75  Resp: 18  Temp: 98.8 F (37.1 C)  SpO2: 98%   Filed Weights   06/03/21 0858  Weight: 145 lb 6.4 oz (66 kg)    GENERAL:alert, no distress and comfortable SKIN: No rash EYES: sclera clear NECK: without mass LYMPH:  no palpable cervical or supraclavicular lymphadenopathy LUNGS: clear with normal breathing effort HEART: regular rate & rhythm, no lower extremity edema ABDOMEN:abdomen soft, non-tender and normal bowel  sounds NEURO: alert & oriented x 3 with fluent speech, no focal motor/sensory deficits  LABORATORY DATA:  I have reviewed the data as listed CBC Latest Ref Rng & Units 06/03/2021 04/14/2020 04/04/2019  WBC 4.0 - 10.5 K/uL 9.0 8.8 9.1  Hemoglobin 13.0 - 17.0 g/dL 13.7 14.7 15.6  Hematocrit 39.0 - 52.0 % 41.4 44.2 46.3  Platelets 150 - 400 K/uL 252 190 206     CMP Latest Ref Rng & Units 06/03/2021 04/14/2020 04/04/2019  Glucose 70 - 99 mg/dL 196(H) 123(H) 92  BUN 6 - 20 mg/dL $Remove'7 12 11  'FUpPJRf$ Creatinine 0.61 - 1.24 mg/dL 0.83 0.80 0.79  Sodium 135 - 145 mmol/L 141 139 140  Potassium 3.5 - 5.1 mmol/L 3.7 3.5 4.0  Chloride 98 - 111 mmol/L 106 107 105  CO2 22 - 32 mmol/L $RemoveB'24 25 23  'rCRVDprN$ Calcium 8.9 - 10.3 mg/dL 8.9 9.3 9.7  Total Protein 6.5 - 8.1 g/dL 7.7 7.3 8.1  Total Bilirubin 0.3 - 1.2 mg/dL 0.3 0.3 0.4  Alkaline Phos 38 - 126 U/L 86 77 85  AST 15 - 41 U/L $Remo'19 16 23  'WiYjV$ ALT 0 - 44 U/L 37 23 41      RADIOGRAPHIC STUDIES: I have personally reviewed the radiological images as listed and agreed with the findings in the report. No results  found.   ASSESSMENT & PLAN: DEYTON ELLENBECKER is a 50 y.o. male with    1. Right colon adenocarcinoma, pT3N2bM0, stage IIIB, MSI-stable  -Diagnosed in 05/2015. S/p right hemicolectomy and adjuvant chemo CAPOX -Colonoscopy from 02/15/17 was normal, no samples taken. Repeat in 3 years, overdue -CEA >5 at baseline, current smoker.  Not a helpful surveillance stool -Last imaging 04/2018 showed NED -Mr. Steinhauser appears well.  Exam is benign, labs unremarkable except elevated BG.  There is no clinical concern for colon cancer recurrence.  He is overdue for colonoscopy, agrees to schedule with Dr. Silverio Decamp. -He has completed over 5 years colon cancer surveillance in our clinic, I will discharge him back to GI and he agrees to establish with PCP -We discussed the small chance of late recurrence, we reviewed signs and symptoms. He knows to call with any concerning  changes -Follow-up open, as needed in the future.   2. Left knee pain -he went to urgent care for this in 01/2021 -Xray showed osteoarthritis and moderate joint effusion -takes NSAID PRN -f/up with PCP once he establishes care  3. Hepatic steatosis and transaminitis -mild intermittent transaminitis, likely benign, continue monitoring.  -09/09/17 CT scan shows steatosis -LFTs normal since 03/2019  4.  Health maintenance, disease prevention -He does not have a PCP denies recent PSA for other routine blood work.  He agrees to PCP referral today -He continues to smoke a little, does not drink much alcohol.  I encouraged him to completely quit smoking and continue to limit alcohol due to risk of colon cancer recurrence and other new cancers. -I encouraged a healthy active lifestyle, normal diet, hydration, and other age-appropriate health maintenance  PLAN: -labs reviewed -remains in clinical remission from colon cancer, completed 6 years surveillance -discharge back to GI ([place]) and establish with PCP for surveillance and general healthcare  -agrees to schedule colonoscopy, I will cc note to GI -reviewed healthy active lifestyle, smoking cessation, etc. -f/up open, as needed in the future   Orders Placed This Encounter  Procedures   Ambulatory referral to Internal Medicine    Referral Priority:   Routine    Referral Type:   Consultation    Referral Reason:   Specialty Services Required    Requested Specialty:   Internal Medicine    Number of Visits Requested:   1    All questions were answered. The patient knows to call the clinic with any problems, questions or concerns. No barriers to learning were detected.     Alla Feeling, NP 06/03/21

## 2021-06-03 ENCOUNTER — Other Ambulatory Visit: Payer: BC Managed Care – PPO

## 2021-06-03 ENCOUNTER — Inpatient Hospital Stay: Payer: BC Managed Care – PPO | Attending: Nurse Practitioner

## 2021-06-03 ENCOUNTER — Inpatient Hospital Stay: Payer: BC Managed Care – PPO | Admitting: Nurse Practitioner

## 2021-06-03 ENCOUNTER — Other Ambulatory Visit: Payer: Self-pay

## 2021-06-03 ENCOUNTER — Telehealth: Payer: Self-pay | Admitting: *Deleted

## 2021-06-03 ENCOUNTER — Ambulatory Visit: Payer: BC Managed Care – PPO | Admitting: Adult Health

## 2021-06-03 ENCOUNTER — Encounter: Payer: Self-pay | Admitting: Nurse Practitioner

## 2021-06-03 VITALS — BP 148/83 | HR 75 | Temp 98.8°F | Resp 18 | Ht 66.0 in | Wt 145.4 lb

## 2021-06-03 DIAGNOSIS — M25562 Pain in left knee: Secondary | ICD-10-CM | POA: Diagnosis not present

## 2021-06-03 DIAGNOSIS — C182 Malignant neoplasm of ascending colon: Secondary | ICD-10-CM | POA: Diagnosis present

## 2021-06-03 DIAGNOSIS — R7401 Elevation of levels of liver transaminase levels: Secondary | ICD-10-CM | POA: Insufficient documentation

## 2021-06-03 DIAGNOSIS — K5900 Constipation, unspecified: Secondary | ICD-10-CM | POA: Diagnosis not present

## 2021-06-03 DIAGNOSIS — K760 Fatty (change of) liver, not elsewhere classified: Secondary | ICD-10-CM | POA: Diagnosis not present

## 2021-06-03 DIAGNOSIS — R197 Diarrhea, unspecified: Secondary | ICD-10-CM | POA: Diagnosis not present

## 2021-06-03 DIAGNOSIS — F17200 Nicotine dependence, unspecified, uncomplicated: Secondary | ICD-10-CM | POA: Diagnosis not present

## 2021-06-03 LAB — CMP (CANCER CENTER ONLY)
ALT: 37 U/L (ref 0–44)
AST: 19 U/L (ref 15–41)
Albumin: 3.9 g/dL (ref 3.5–5.0)
Alkaline Phosphatase: 86 U/L (ref 38–126)
Anion gap: 11 (ref 5–15)
BUN: 7 mg/dL (ref 6–20)
CO2: 24 mmol/L (ref 22–32)
Calcium: 8.9 mg/dL (ref 8.9–10.3)
Chloride: 106 mmol/L (ref 98–111)
Creatinine: 0.83 mg/dL (ref 0.61–1.24)
GFR, Estimated: >60 mL/min (ref >60)
Glucose, Bld: 196 mg/dL — ABNORMAL HIGH (ref 70–99)
Potassium: 3.7 mmol/L (ref 3.5–5.1)
Sodium: 141 mmol/L (ref 135–145)
Total Bilirubin: 0.3 mg/dL (ref 0.3–1.2)
Total Protein: 7.7 g/dL (ref 6.5–8.1)

## 2021-06-03 LAB — CBC WITH DIFFERENTIAL (CANCER CENTER ONLY)
Abs Immature Granulocytes: 0.03 10*3/uL (ref 0.00–0.07)
Basophils Absolute: 0.1 10*3/uL (ref 0.0–0.1)
Basophils Relative: 1 %
Eosinophils Absolute: 0.2 10*3/uL (ref 0.0–0.5)
Eosinophils Relative: 2 %
HCT: 41.4 % (ref 39.0–52.0)
Hemoglobin: 13.7 g/dL (ref 13.0–17.0)
Immature Granulocytes: 0 %
Lymphocytes Relative: 25 %
Lymphs Abs: 2.2 10*3/uL (ref 0.7–4.0)
MCH: 29.7 pg (ref 26.0–34.0)
MCHC: 33.1 g/dL (ref 30.0–36.0)
MCV: 89.6 fL (ref 80.0–100.0)
Monocytes Absolute: 0.4 10*3/uL (ref 0.1–1.0)
Monocytes Relative: 5 %
Neutro Abs: 6.1 10*3/uL (ref 1.7–7.7)
Neutrophils Relative %: 67 %
Platelet Count: 252 10*3/uL (ref 150–400)
RBC: 4.62 MIL/uL (ref 4.22–5.81)
RDW: 12.4 % (ref 11.5–15.5)
WBC Count: 9 10*3/uL (ref 4.0–10.5)
nRBC: 0 % (ref 0.0–0.2)

## 2021-06-03 NOTE — Telephone Encounter (Signed)
Called pt to make aware that referral to [place] at [facility] was placed and appointment was made for September 15, 2021 at [facility] with Dr.Sagardia. Advise pt that he will receive an appointment reminder closer time to the time of appt. Pt verbalized understanding.

## 2021-07-01 ENCOUNTER — Telehealth: Payer: Self-pay

## 2021-07-01 NOTE — Telephone Encounter (Signed)
Called patient via interpreter: John Moon [phone]. Home ph. Does not have voice mail set-up. Left message on his cell ph. To please call us and schedule a Colonoscopy, that he is 1 1/2 years passed due.

## 2021-07-01 NOTE — Telephone Encounter (Signed)
-----   Message from Mauri Pole, MD sent at 07/01/2021  1:28 PM EST ----- John Moon, please check patient hasnt scheduled his procedure yet Thanks ----- Message ----- From: Alla Feeling, NP Sent: 06/03/2021   9:40 AM EST To: Mauri Pole, MD  Hi Dr. Silverio Decamp,  John Moon finally returned. He is doing well, no concerns at 6 years from R colon cancer IIIc. I discharged him from our clinic. He agrees to schedule colonoscopy with you (was due in 2021) and establish with a PCP.   Porsche, could you please complete the referral to [facility], order is in epic.   Thanks, Regan Rakers NP

## 2021-09-15 ENCOUNTER — Ambulatory Visit: Payer: BC Managed Care – PPO | Admitting: Emergency Medicine

## 2023-10-09 ENCOUNTER — Emergency Department (HOSPITAL_COMMUNITY)
Admission: EM | Admit: 2023-10-09 | Discharge: 2023-10-09 | Disposition: A | Discharge status: Home / Self Care | Payer: Self-pay | Attending: Emergency Medicine | Admitting: Emergency Medicine

## 2023-10-09 ENCOUNTER — Encounter (HOSPITAL_COMMUNITY): Payer: Self-pay

## 2023-10-09 ENCOUNTER — Other Ambulatory Visit: Payer: Self-pay

## 2023-10-09 ENCOUNTER — Emergency Department (HOSPITAL_COMMUNITY): Payer: Self-pay

## 2023-10-09 DIAGNOSIS — M19031 Primary osteoarthritis, right wrist: Secondary | ICD-10-CM | POA: Insufficient documentation

## 2023-10-09 MED ORDER — NAPROXEN 375 MG PO TABS: 375.0000 mg | ORAL_TABLET | Freq: Two times a day (BID) | ORAL | 0 refills | Status: AC

## 2023-10-09 NOTE — ED Triage Notes (Signed)
 Pt states swelling to the right arm. That started a week ago. Pt has treated the swelling with warm water without relief. Pt denies injury.  Pt denies fever, rash, or being bitten by an insect.   Pt pain mostly in the wrist.

## 2023-10-09 NOTE — ED Provider Notes (Signed)
 [facility] Provider Note   CSN: [id number] Arrival date & time: 10/09/23  1136     History  Chief Complaint  Patient presents with   Arm Swelling    John Moon is a 53 y.o. male.  HPI   Patient presents ED for evaluation of wrist pain.  Patient has history of colitis as well as colon cancer.  He states about a week ago he started having pain and swelling in his wrist.  Patient states he was doing a lot of activity at work with his right arm in the symptoms started after that.  He denies any falls or injuries.  No fevers or chills.  Home Medications Prior to Admission medications   Medication Sig Start Date End Date Taking? Authorizing Provider  naproxen  (NAPROSYN ) 375 MG tablet Take 1 tablet (375 mg total) by mouth 2 (two) times daily. 10/09/23  Yes Trish Furl, MD      Allergies    Patient has no known allergies.    Review of Systems   Review of Systems  Physical Exam Updated Vital Signs BP (!) 158/101   Pulse 71   Temp 98.3 F (36.8 C)   Resp 17   Ht 1.727 m (5\' 8" )   Wt 72.6 kg   SpO2 100%   BMI 24.33 kg/m  Physical Exam Vitals and nursing note reviewed.  Constitutional:      General: He is not in acute distress.    Appearance: He is well-developed.  HENT:     Head: Normocephalic and atraumatic.     Right Ear: External ear normal.     Left Ear: External ear normal.  Eyes:     General: No scleral icterus.       Right eye: No discharge.        Left eye: No discharge.     Conjunctiva/sclera: Conjunctivae normal.  Neck:     Trachea: No tracheal deviation.  Cardiovascular:     Rate and Rhythm: Normal rate.  Pulmonary:     Effort: Pulmonary effort is normal. No respiratory distress.     Breath sounds: No stridor.  Abdominal:     General: There is no distension.  Musculoskeletal:        General: Swelling and tenderness present. No deformity.     Cervical back: Neck supple.     Comments: Patient with  evidence of swelling in the right wrist, pain with range of motion, no erythema, no increased warmth, no tenderness palpation of the hand or forearm  Skin:    General: Skin is warm and dry.     Findings: No rash.  Neurological:     Mental Status: He is alert. Mental status is at baseline.     Cranial Nerves: No dysarthria or facial asymmetry.     Motor: No seizure activity.     ED Results / Procedures / Treatments   Labs (all labs ordered are listed, but only abnormal results are displayed) Labs Reviewed - No data to display   EKG None  Radiology DG Wrist Complete Right Result Date: 10/09/2023 CLINICAL DATA:  Right arm swelling starting a week ago. EXAM: RIGHT WRIST - COMPLETE 3+ VIEW COMPARISON:  None Available. FINDINGS: Neutral ulnar variance. There is a well-circumscribed lucent cysts within the proximal 50% of the scaphoid measuring up to approximately 5 x 10 mm (transverse by craniocaudal). Mild thumb carpometacarpal greater than triscaphe peripheral degenerative osteophytes without significant joint space narrowing. No  acute fracture is seen. No dislocation. IMPRESSION: 1. 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 2. No acute fracture. 3. There is a well-circumscribed lucent cyst within the proximal 50% of the scaphoid measuring up to approximately 5 x 10 mm. This is nonspecific but may represent a degenerative cyst, intraosseous ganglion, or enchondroma. Electronically Signed   By: Bertina Broccoli M.D.   On: 10/09/2023 14:04    Procedures Procedures    Medications Ordered in ED Medications - No data to display  ED Course/ Medical Decision Making/ A&P Clinical Course as of 10/09/23 1607  Sun Oct 09, 2023  1559 X-rays show carpometacarpal and triscaphe osteoarthritis.  Patient also has possible degenerative cyst for intraosseous ganglion versus endochondroma. [JK]    Clinical Course User Index [JK] Trish Furl, MD                                  Medical Decision Making Risk Prescription drug management.   Patient presented to ED with complaints of right wrist pain.  Patient recalls some increased activity with his wrist prior to the symptoms starting.  No specific falls or injuries.  Exam not suggestive of infection.  There is no erythema there is no increased warmth.  Patient's not having any fevers or chills.  X-rays do show findings consistent with arthritis.  I suspect this etiology for his wrist discomfort and swelling.  Will discharge home with a wrist brace NSAIDs.  Recommend outpatient follow-up with orthopedics.        Final Clinical Impression(s) / ED Diagnoses Final diagnoses:  Arthritis of right wrist    Rx / DC Orders ED Discharge Orders          Ordered    naproxen  (NAPROSYN ) 375 MG tablet  2 times daily        10/09/23 1605              Trish Furl, MD 10/09/23 1607

## 2023-10-09 NOTE — Discharge Instructions (Signed)
 Take the medications to help with the pain and swelling in your wrist.  Wear the brace to rest your wrist.  Consider following up with an orthopedic doctor if the symptoms do not improve in the next week

## 2023-10-09 NOTE — ED Provider Triage Note (Signed)
 Emergency Medicine Provider Triage Evaluation Note  ELSON ULBRICH , a 53 y.o. male  was evaluated in triage.  Pt complains of wrist pain, hand swelling.  Reports has been ongoing for the last week or so.  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  No prior history of gout.  Denies any recent falls, trauma or injury to explain his pain.  No prior history of PEs or DVTs but not on blood thinners.  Nothing alleviates his symptoms.  Review of Systems  Positive: As above Negative: As above  Physical Exam  Ht 5\' 8"  (1.727 m)   Wt 72.6 kg   BMI 24.33 kg/m  Gen:   Awake, no distress   Resp:  Normal effort  MSK:   Pain is localized to the right wrist with some mild swelling present but no obvious erythema or warmth.  Left arm is unremarkable.  Testing for flexion extension as well as supination and pronation against resistance is impaired due to pain in the right wrist. Other:    Medical Decision Making  Medically screening exam initiated at 12:08 PM.  Appropriate orders placed.  ERNESTO ZUKOWSKI was informed that the remainder of the evaluation will be completed by another provider, this initial triage assessment does not replace that evaluation, and the importance of remaining in the ED until their evaluation is complete.     Ceonna Frazzini A, PA-C 10/09/23 1210
